# Patient Record
Sex: Male | Born: 1970 | Race: White | Hispanic: No | Marital: Married | Smoking: Former smoker
Health system: Southern US, Community
[De-identification: ages and names within clinical notes are randomized; demographics above are authoritative.]

## PROBLEM LIST (undated history)

## (undated) DIAGNOSIS — F32A Depression, unspecified: Secondary | ICD-10-CM

## (undated) DIAGNOSIS — R200 Anesthesia of skin: Secondary | ICD-10-CM

## (undated) DIAGNOSIS — F419 Anxiety disorder, unspecified: Secondary | ICD-10-CM

## (undated) DIAGNOSIS — F329 Major depressive disorder, single episode, unspecified: Secondary | ICD-10-CM

## (undated) DIAGNOSIS — I10 Essential (primary) hypertension: Secondary | ICD-10-CM

## (undated) DIAGNOSIS — G4730 Sleep apnea, unspecified: Secondary | ICD-10-CM

## (undated) DIAGNOSIS — E739 Lactose intolerance, unspecified: Secondary | ICD-10-CM

## (undated) DIAGNOSIS — J45909 Unspecified asthma, uncomplicated: Secondary | ICD-10-CM

## (undated) DIAGNOSIS — T7840XA Allergy, unspecified, initial encounter: Secondary | ICD-10-CM

## (undated) DIAGNOSIS — K219 Gastro-esophageal reflux disease without esophagitis: Secondary | ICD-10-CM

## (undated) DIAGNOSIS — R600 Localized edema: Secondary | ICD-10-CM

## (undated) HISTORY — DX: Lactose intolerance, unspecified: E73.9

## (undated) HISTORY — DX: Allergy, unspecified, initial encounter: T78.40XA

## (undated) HISTORY — DX: Depression, unspecified: F32.A

## (undated) HISTORY — DX: Gastro-esophageal reflux disease without esophagitis: K21.9

## (undated) HISTORY — DX: Anxiety disorder, unspecified: F41.9

## (undated) HISTORY — DX: Localized edema: R60.0

## (undated) HISTORY — PX: NASAL SINUS SURGERY: SHX719

## (undated) HISTORY — PX: VASECTOMY: SHX75

## (undated) HISTORY — DX: Anesthesia of skin: R20.0

## (undated) HISTORY — DX: Sleep apnea, unspecified: G47.30

## (undated) HISTORY — DX: Essential (primary) hypertension: I10

## (undated) HISTORY — DX: Unspecified asthma, uncomplicated: J45.909

---
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
## 2013-11-20 HISTORY — PX: BICEPS TENDON REPAIR: SHX566

## 2015-04-19 DIAGNOSIS — N521 Erectile dysfunction due to diseases classified elsewhere: Secondary | ICD-10-CM | POA: Insufficient documentation

## 2015-04-19 DIAGNOSIS — N529 Male erectile dysfunction, unspecified: Secondary | ICD-10-CM | POA: Insufficient documentation

## 2016-10-02 DIAGNOSIS — K529 Noninfective gastroenteritis and colitis, unspecified: Secondary | ICD-10-CM | POA: Diagnosis not present

## 2016-10-07 DIAGNOSIS — R197 Diarrhea, unspecified: Secondary | ICD-10-CM | POA: Diagnosis not present

## 2016-10-08 LAB — CBC AND DIFFERENTIAL
HCT: 49 (ref 41–53)
Hemoglobin: 17.4 (ref 13.5–17.5)
Platelets: 357 (ref 150–399)
WBC: 7.4

## 2016-10-08 LAB — BASIC METABOLIC PANEL
BUN: 16 (ref 4–21)
Creatinine: 1.2 (ref 0.6–1.3)
Glucose: 111
Potassium: 3.9 (ref 3.4–5.3)
Sodium: 140 (ref 137–147)

## 2016-10-09 DIAGNOSIS — R197 Diarrhea, unspecified: Secondary | ICD-10-CM | POA: Diagnosis not present

## 2016-10-09 DIAGNOSIS — R1030 Lower abdominal pain, unspecified: Secondary | ICD-10-CM | POA: Diagnosis not present

## 2016-10-09 DIAGNOSIS — R1032 Left lower quadrant pain: Secondary | ICD-10-CM | POA: Diagnosis not present

## 2016-10-09 DIAGNOSIS — R109 Unspecified abdominal pain: Secondary | ICD-10-CM | POA: Diagnosis not present

## 2016-10-09 DIAGNOSIS — E860 Dehydration: Secondary | ICD-10-CM | POA: Diagnosis not present

## 2016-10-09 DIAGNOSIS — N179 Acute kidney failure, unspecified: Secondary | ICD-10-CM | POA: Diagnosis not present

## 2016-10-09 DIAGNOSIS — R945 Abnormal results of liver function studies: Secondary | ICD-10-CM | POA: Diagnosis not present

## 2016-10-19 DIAGNOSIS — R600 Localized edema: Secondary | ICD-10-CM | POA: Diagnosis not present

## 2016-10-19 DIAGNOSIS — Z880 Allergy status to penicillin: Secondary | ICD-10-CM | POA: Diagnosis not present

## 2016-10-19 DIAGNOSIS — Z79899 Other long term (current) drug therapy: Secondary | ICD-10-CM | POA: Diagnosis not present

## 2016-10-19 DIAGNOSIS — I10 Essential (primary) hypertension: Secondary | ICD-10-CM | POA: Diagnosis not present

## 2016-10-19 DIAGNOSIS — E876 Hypokalemia: Secondary | ICD-10-CM | POA: Diagnosis not present

## 2016-10-19 DIAGNOSIS — R609 Edema, unspecified: Secondary | ICD-10-CM | POA: Diagnosis not present

## 2016-10-24 DIAGNOSIS — R197 Diarrhea, unspecified: Secondary | ICD-10-CM | POA: Diagnosis not present

## 2016-10-24 DIAGNOSIS — R609 Edema, unspecified: Secondary | ICD-10-CM | POA: Diagnosis not present

## 2016-10-24 LAB — BASIC METABOLIC PANEL
BUN: 10 (ref 4–21)
Creatinine: 1.1 (ref 0.6–1.3)
Glucose: 107
Potassium: 4.6 (ref 3.4–5.3)
Sodium: 141 (ref 137–147)

## 2016-10-24 LAB — HEPATIC FUNCTION PANEL
ALT: 33 (ref 10–40)
AST: 36 (ref 14–40)
Alkaline Phosphatase: 54 (ref 25–125)
Bilirubin, Total: 0.5

## 2016-11-22 DIAGNOSIS — A084 Viral intestinal infection, unspecified: Secondary | ICD-10-CM | POA: Diagnosis not present

## 2016-12-22 MED FILL — BUPROPION HCL XL 300 MG TAB: 300 | 90 days supply | Qty: 90 | Fill #0

## 2016-12-22 MED FILL — LOSARTAN-HCTZ 100-12.5 MG T: 100-12.5 | 90 days supply | Qty: 90 | Fill #0

## 2017-01-05 MED FILL — MONTELUKAST SOD 10 MG TAB: 10 | 90 days supply | Qty: 90 | Fill #0

## 2017-04-08 MED FILL — LOSARTAN-HCTZ 100-12.5 MG T: 100-12.5 | 30 days supply | Qty: 30 | Fill #0

## 2017-04-08 MED FILL — MONTELUKAST SOD 10 MG TAB: 10 | 90 days supply | Qty: 90 | Fill #1

## 2017-04-08 MED FILL — BUPROPION HCL XL 300 MG TAB: 300 | 30 days supply | Qty: 30 | Fill #0

## 2017-04-27 ENCOUNTER — Telehealth: Payer: Self-pay | Admitting: Family Medicine

## 2017-04-27 ENCOUNTER — Ambulatory Visit (INDEPENDENT_AMBULATORY_CARE_PROVIDER_SITE_OTHER): Payer: 59 | Admitting: Family Medicine

## 2017-04-27 ENCOUNTER — Encounter: Payer: Self-pay | Admitting: Family Medicine

## 2017-04-27 VITALS — BP 129/89 | HR 62 | Ht 76.0 in | Wt 351.5 lb

## 2017-04-27 DIAGNOSIS — F32A Depression, unspecified: Secondary | ICD-10-CM | POA: Insufficient documentation

## 2017-04-27 DIAGNOSIS — Z833 Family history of diabetes mellitus: Secondary | ICD-10-CM | POA: Insufficient documentation

## 2017-04-27 DIAGNOSIS — E66812 Obesity, class 2: Secondary | ICD-10-CM | POA: Insufficient documentation

## 2017-04-27 DIAGNOSIS — I10 Essential (primary) hypertension: Secondary | ICD-10-CM

## 2017-04-27 DIAGNOSIS — F4323 Adjustment disorder with mixed anxiety and depressed mood: Secondary | ICD-10-CM | POA: Insufficient documentation

## 2017-04-27 DIAGNOSIS — Z9989 Dependence on other enabling machines and devices: Secondary | ICD-10-CM | POA: Insufficient documentation

## 2017-04-27 DIAGNOSIS — Z6837 Body mass index (BMI) 37.0-37.9, adult: Secondary | ICD-10-CM | POA: Insufficient documentation

## 2017-04-27 DIAGNOSIS — Z813 Family history of other psychoactive substance abuse and dependence: Secondary | ICD-10-CM | POA: Insufficient documentation

## 2017-04-27 DIAGNOSIS — F4321 Adjustment disorder with depressed mood: Secondary | ICD-10-CM

## 2017-04-27 DIAGNOSIS — Z811 Family history of alcohol abuse and dependence: Secondary | ICD-10-CM | POA: Insufficient documentation

## 2017-04-27 DIAGNOSIS — E559 Vitamin D deficiency, unspecified: Secondary | ICD-10-CM

## 2017-04-27 DIAGNOSIS — K219 Gastro-esophageal reflux disease without esophagitis: Secondary | ICD-10-CM | POA: Diagnosis not present

## 2017-04-27 DIAGNOSIS — Z6372 Alcoholism and drug addiction in family: Secondary | ICD-10-CM

## 2017-04-27 DIAGNOSIS — Z818 Family history of other mental and behavioral disorders: Secondary | ICD-10-CM | POA: Insufficient documentation

## 2017-04-27 DIAGNOSIS — G4733 Obstructive sleep apnea (adult) (pediatric): Secondary | ICD-10-CM | POA: Insufficient documentation

## 2017-04-27 DIAGNOSIS — R200 Anesthesia of skin: Secondary | ICD-10-CM

## 2017-04-27 DIAGNOSIS — R202 Paresthesia of skin: Secondary | ICD-10-CM | POA: Insufficient documentation

## 2017-04-27 DIAGNOSIS — Z807 Family history of other malignant neoplasms of lymphoid, hematopoietic and related tissues: Secondary | ICD-10-CM | POA: Insufficient documentation

## 2017-04-27 DIAGNOSIS — J3089 Other allergic rhinitis: Secondary | ICD-10-CM | POA: Insufficient documentation

## 2017-04-27 MED ORDER — BUPROPION HCL ER (XL) 150 MG PO TB24: 150.0000 mg | ORAL_TABLET | ORAL | 0 refills | Status: DC

## 2017-04-27 NOTE — Patient Instructions (Addendum)
Start the 150 mg of your Wellbutrin XL daily.  Follow-up in approximately one month we can see how you're doing on that and see if we can even bump that back further.    Please realize, EXERCISE IS MEDICINE!  -  American Heart Association [facility]( AHA) guidelines for exercise : If you are in good health, without any medical conditions, you should engage in 150 minutes of moderate intensity aerobic activity per week.  This means you should be huffing and puffing throughout your workout.   Engaging in regular exercise will improve brain function and memory, as well as improve mood, boost immune system and help with weight management.  As well as the other, more well-known effects of exercise such as decreasing blood sugar levels, decreasing blood pressure,  and decreasing bad cholesterol levels/ increasing good cholesterol levels.     -  The AHA strongly endorses consumption of a diet that contains a variety of foods from all the food categories with an emphasis on fruits and vegetables; fat-free and low-fat dairy products; cereal and grain products; legumes and nuts; and fish, poultry, and/or extra lean meats.    Excessive food intake, especially of foods high in saturated and trans fats, sugar, and salt, should be avoided.    Adequate water intake of roughly 1/2 of your weight in pounds, should equal the ounces of water per day you should drink.  So for instance, if you're 200 pounds, that would be 100 ounces of water per day.         Mediterranean Diet  Why follow it? Research shows. . Those who follow the Mediterranean diet have a reduced risk of heart disease  . The diet is associated with a reduced incidence of Parkinson's and Alzheimer's diseases . People following the diet may have longer life expectancies and lower rates of chronic diseases  . The Dietary Guidelines for Americans recommends the Mediterranean diet as an eating plan to promote health and prevent disease  What Is the Mediterranean  Diet?  . Healthy eating plan based on typical foods and recipes of Mediterranean-style cooking . The diet is primarily a plant based diet; these foods should make up a majority of meals   Starches - Plant based foods should make up a majority of meals - They are an important sources of vitamins, minerals, energy, antioxidants, and fiber - Choose whole grains, foods high in fiber and minimally processed items  - Typical grain sources include wheat, oats, barley, corn, brown rice, bulgar, farro, millet, polenta, couscous  - Various types of beans include chickpeas, lentils, fava beans, black beans, white beans   Fruits  Veggies - Large quantities of antioxidant rich fruits & veggies; 6 or more servings  - Vegetables can be eaten raw or lightly drizzled with oil and cooked  - Vegetables common to the traditional Mediterranean Diet include: artichokes, arugula, beets, broccoli, brussel sprouts, cabbage, carrots, celery, collard greens, cucumbers, eggplant, kale, leeks, lemons, lettuce, mushrooms, okra, onions, peas, peppers, potatoes, pumpkin, radishes, rutabaga, shallots, spinach, sweet potatoes, turnips, zucchini - Fruits common to the Mediterranean Diet include: apples, apricots, avocados, cherries, clementines, dates, figs, grapefruits, grapes, melons, nectarines, oranges, peaches, pears, pomegranates, strawberries, tangerines  Fats - Replace butter and margarine with healthy oils, such as olive oil, canola oil, and tahini  - Limit nuts to no more than a handful a day  - Nuts include walnuts, almonds, pecans, pistachios, pine nuts  - Limit or avoid candied, honey roasted or heavily salted nuts -  Quentin [place] are central to the Mediterranean diet - can be eaten whole or used in a variety of dishes   Meats Protein - Limiting red meat: no more than a few times a month - When eating red meat: choose lean cuts and keep the portion to the size of deck of cards - Eggs: approx. 0 to 4 times a week  - Fish and  lean poultry: at least 2 a week  - Healthy protein sources include, chicken, [place], lean beef, lamb - Increase intake of seafood such as tuna, salmon, trout, mackerel, shrimp, scallops - Avoid or limit high fat processed meats such as sausage and bacon  Dairy - Include moderate amounts of low fat dairy products  - Focus on healthy dairy such as fat free yogurt, skim milk, low or reduced fat cheese - Limit dairy products higher in fat such as whole or 2% milk, cheese, ice cream  Alcohol - Moderate amounts of red wine is ok  - No more than 5 oz daily for women (all ages) and men older than age 80  - No more than 10 oz of wine daily for men younger than 53  Other - Limit sweets and other desserts  - Use herbs and spices instead of salt to flavor foods  - Herbs and spices common to the traditional Mediterranean Diet include: basil, bay leaves, chives, cloves, cumin, fennel, garlic, lavender, marjoram, mint, oregano, parsley, pepper, rosemary, sage, savory, sumac, tarragon, thyme   It's not just a diet, it's a lifestyle:  . The Mediterranean diet includes lifestyle factors typical of those in the region  . Foods, drinks and meals are best eaten with others and savored . Daily physical activity is important for overall good health . This could be strenuous exercise like running and aerobics . This could also be more leisurely activities such as walking, housework, yard-work, or taking the stairs . Moderation is the key; a balanced and healthy diet accommodates most foods and drinks . Consider portion sizes and frequency of consumption of certain foods   Meal Ideas & Options:  . Breakfast:  o Whole wheat toast or whole wheat English muffins with peanut butter & hard boiled egg o Steel cut oats topped with apples & cinnamon and skim milk  o Fresh fruit: banana, strawberries, melon, berries, peaches  o Smoothies: strawberries, bananas, greek yogurt, peanut butter o Low fat greek yogurt with  blueberries and granola  o Egg white omelet with spinach and mushrooms o Breakfast couscous: whole wheat couscous, apricots, skim milk, cranberries  . Sandwiches:  o Hummus and grilled vegetables (peppers, zucchini, squash) on whole wheat bread   o Grilled chicken on whole wheat pita with lettuce, tomatoes, cucumbers or tzatziki  o Tuna salad on whole wheat bread: tuna salad made with greek yogurt, olives, red peppers, capers, green onions o Garlic rosemary lamb pita: lamb sauted with garlic, rosemary, salt & pepper; add lettuce, cucumber, greek yogurt to pita - flavor with lemon juice and black pepper  . Seafood:  o Mediterranean grilled salmon, seasoned with garlic, basil, parsley, lemon juice and black pepper o Shrimp, lemon, and spinach whole-grain pasta salad made with low fat greek yogurt  o Seared scallops with lemon orzo  o Seared tuna steaks seasoned salt, pepper, coriander topped with tomato mixture of olives, tomatoes, olive oil, minced garlic, parsley, green onions and cappers  . Meats:  o Herbed greek chicken salad with kalamata olives, cucumber, feta  o Red bell peppers stuffed  with spinach, bulgur, lean ground beef (or lentils) & topped with feta   o Kebabs: skewers of chicken, tomatoes, onions, zucchini, squash  o [place] burgers: made with red onions, mint, dill, lemon juice, feta cheese topped with roasted red peppers . Vegetarian o Cucumber salad: cucumbers, artichoke hearts, celery, red onion, feta cheese, tossed in olive oil & lemon juice  o Hummus and whole grain pita points with a greek salad (lettuce, tomato, feta, olives, cucumbers, red onion) o Lentil soup with celery, carrots made with vegetable broth, garlic, salt and pepper  o Tabouli salad: parsley, bulgur, mint, scallions, cucumbers, tomato, radishes, lemon juice, olive oil, salt and pepper.

## 2017-04-27 NOTE — Progress Notes (Signed)
New patient office visit note:  Impression and Recommendations:    1. Obesity, Class III, BMI 40-49.9 (morbid obesity) (HCC)   2. Essential hypertension   3. h/o Vitamin D insufficiency   4. Adjustment disorder with depressed mood in remission   5. Numbness of toes   6. OSA on CPAP   7. Family history of suicide- father age 47   8. Family history of alcoholism in grandfathers   85. Family history of drug addiction   10. Family history of depression   11. Family history of diet-controlled diabetes- father in 30s   29. Gastroesophageal reflux disease, esophagitis presence not specified   13. Family history of non-Hodgkin's lymphoma- father    - Fasting labs today.  Will review in 3-4 weeks. - Counseling done regarding BMI. - Continue to monitor home blood pressures.  Has a monitor at home.  Goal of 130/80 or less. - We'll check vitamin D today. - Patient prefers to try to cut his Wellbutrin in half.  We will go from 300 down to 150 mg per day.  New prescription given today.  We'll follow up in 3-4 weeks. - Patient mentions at the end of office visit some numbness in his lateral aspect of his toes bilaterally.  This is at the end of the day.  We will check a B12 and then will assess this issue more thoroughly next office visit.   Pt was in the office today for 40+ minutes, with over 50% time spent in face to face counseling of patients various medical conditions, treatment plans of those medical conditions including medicine management and lifestyle modification, strategies to improve health and well being; and in coordination of care. SEE ABOVE FOR DETAILS The patient was counseled, risk factors were discussed, anticipatory guidance given.   New Prescriptions   BUPROPION (WELLBUTRIN XL) 150 MG 24 HR TABLET    Take 1 tablet (150 mg total) by mouth every morning.    Meds ordered this encounter  Medications  . DISCONTD: buPROPion (WELLBUTRIN XL) 300 MG 24 hr tablet    Sig: Take  1 tablet by mouth daily.  Marland Kitchen losartan-hydrochlorothiazide (HYZAAR) 100-12.5 MG tablet    Sig: Take 1 tablet by mouth daily.  . montelukast (SINGULAIR) 10 MG tablet    Sig: Take 1 tablet by mouth daily.  . Omega-3 Fatty Acids (FISH OIL) 1200 MG CAPS    Sig: Take 1 capsule by mouth daily.  . vitamin B-12 (CYANOCOBALAMIN) 100 MCG tablet    Sig: Take 100 mcg by mouth daily.  . cetirizine (ZYRTEC) 10 MG tablet    Sig: Take 10 mg by mouth daily.  . RaNITidine HCl (RANITIDINE 75 PO)    Sig: Take 1 tablet by mouth daily.  Marland Kitchen buPROPion (WELLBUTRIN XL) 150 MG 24 hr tablet    Sig: Take 1 tablet (150 mg total) by mouth every morning.    Dispense:  30 tablet    Refill:  0    Discontinued Medications   BUPROPION (WELLBUTRIN XL) 300 MG 24 HR TABLET    Take 1 tablet by mouth daily.    Modified Medications   No medications on file    Orders Placed This Encounter  Procedures  . CBC with Differential/Platelet  . Comprehensive metabolic panel  . Hemoglobin A1c  . Lipid panel  . TSH  . VITAMIN D 25 Hydroxy (Vit-D Deficiency, Fractures)  . Vitamin B12    Return for 3-4 wks, review labs and discuss  med dose change.  Please see AVS handed out to patient at the end of our visit for further patient instructions/ counseling done pertaining to today's office visit.    Note: This document was prepared using Dragon voice recognition software and may include unintentional dictation errors.  ----------------------------------------------------------------------------------------------------------------------    Subjective:    Chief complaint:   Chief Complaint  Patient presents with  . Establish Care     HPI: Joseph Graves is a pleasant 46 y.o. male who [facility] to review their medical history with me and establish care.   I asked the patient to review their chronic problem list with me to ensure everything was updated and accurate.    All  recent office visits with other providers, any medical records that patient brought in etc  - I reviewed today.     Also asked pt to get me medical records from [facility] providers/ specialists that they had seen within the past 3-5 years- if they are in private practice and/or do not work for a [facility], [facility], [facility], [facility] or [facility] owned practice.  Told them to call their specialists to clarify this if they are not sure.   Lost 40lbs since last October.  Wt loss was stress mediated at that time.    [place] to work in [place]  But lost that job and now works in [employer] with [facility] [facility].  He loves his job.  - Moved here in March with his family.  Patient states much less stress than usual.  Patient with a history of hypertension for 3-4 years been on blood pressure medicines.    Pass PCP:  [facility].    Started wellbutrin back 4 5 yrs or so ago.  For major stressful events in life.  Recently new job he loves.  Stay at home wife.  Feeling great lately. Good mood.   Problem  Hypertension   3-4 yrs on bp meds.   Well controlled at home- in the 130s over 70s whenever he checks it.   Adjustment Disorder With Depressed Mood in Remission   Patient has been on medications for approximately 5 years or so.  He had several deaths in his family, his best friend committed suicide, his father committed suicide, and within a 6 month.  He went to 2613 funerals.  He's been on medicines ever since.  He is been feeling fantastic and wishes to wean off the medicine.  He tells me that his stress is been under great control since December of this year.    Gerd (Gastroesophageal Reflux Disease)   Especially to onions and fried foods.   Environmental and Seasonal Allergies  h/o Vitamin D insufficiency  Obesity, Class III, Bmi 40-49.9 (Morbid Obesity) (Hcc)  Numbness of Toes  Osa On Cpap   5-6 yrs or so on cpap---  -[facility]  Jefm Pettyaniel Garber   Family  history of suicide- father age 47  Family history of alcoholism in grandfathers  Family History of Drug Addiction  Family History of Depression  Family history of diet-controlled diabetes- father in 1340s  Family history of non-Hodgkin's lymphoma- father      Wt Readings from Last 3 Encounters:  04/27/17 (!) 351 lb 8 oz (159.4 kg)   BP Readings from Last 3 Encounters:  04/27/17 129/89   Pulse Readings from Last 3 Encounters:  04/27/17 62   BMI Readings from Last  3 Encounters:  04/27/17 42.79 kg/m    Patient Care Team    Relationship Specialty Notifications Start End  Thomasene Lot, DO PCP - General Family Medicine  04/22/17     Patient Active Problem List   Diagnosis Date Noted  . Hypertension 04/27/2017    Priority: High  . Adjustment disorder with depressed mood in remission 04/27/2017    Priority: High  . GERD (gastroesophageal reflux disease) 04/27/2017  . Environmental and seasonal allergies 04/27/2017  . h/o Vitamin D insufficiency 04/27/2017  . Obesity, Class III, BMI 40-49.9 (morbid obesity) (HCC) 04/27/2017  . Numbness of toes 04/27/2017  . OSA on CPAP 04/27/2017  . Family history of suicide- father age 59 04/27/2017  . Family history of alcoholism in grandfathers 04/27/2017  . Family history of drug addiction 04/27/2017  . Family history of depression 04/27/2017  . Family history of diet-controlled diabetes- father in 21s 04/27/2017  . Family history of non-Hodgkin's lymphoma- father 04/27/2017     Past Medical History:  Diagnosis Date  . Allergy   . GERD (gastroesophageal reflux disease)   . Hypertension      Past Medical History:  Diagnosis Date  . Allergy   . GERD (gastroesophageal reflux disease)   . Hypertension      Past Surgical History:  Procedure Laterality Date  . BICEPS TENDON REPAIR  11/2013     Family History  Problem Relation Age of Onset  . Stroke Mother   . Depression Father        suicide  . Cancer Father   .  Diabetes Father   . Diabetes Paternal Uncle   . Alcohol abuse Maternal Grandfather   . Hypertension Maternal Grandfather   . Alcohol abuse Paternal Grandfather      History  Drug Use No     History  Alcohol Use  . 0.6 oz/week  . 1 Cans of beer per week     History  Smoking Status  . Former Smoker  . Quit date: 1992  Smokeless Tobacco  . Never Used     Outpatient Encounter Prescriptions as of 04/27/2017  Medication Sig  . cetirizine (ZYRTEC) 10 MG tablet Take 10 mg by mouth daily.  Marland Kitchen losartan-hydrochlorothiazide (HYZAAR) 100-12.5 MG tablet Take 1 tablet by mouth daily.  . montelukast (SINGULAIR) 10 MG tablet Take 1 tablet by mouth daily.  . Omega-3 Fatty Acids (FISH OIL) 1200 MG CAPS Take 1 capsule by mouth daily.  . RaNITidine HCl (RANITIDINE 75 PO) Take 1 tablet by mouth daily.  . vitamin B-12 (CYANOCOBALAMIN) 100 MCG tablet Take 100 mcg by mouth daily.  . [DISCONTINUED] buPROPion (WELLBUTRIN XL) 300 MG 24 hr tablet Take 1 tablet by mouth daily.  Marland Kitchen buPROPion (WELLBUTRIN XL) 150 MG 24 hr tablet Take 1 tablet (150 mg total) by mouth every morning.   No facility-administered encounter medications on file as of 04/27/2017.     Allergies: Penicillins   Review of Systems  Constitutional: Negative for chills, diaphoresis, fever, malaise/fatigue and weight loss.  HENT: Negative for congestion, sore throat and tinnitus.        Has seasonal allergies.  Eyes: Negative for blurred vision, double vision and photophobia.  Respiratory: Negative for cough and wheezing.   Cardiovascular: Negative for chest pain and palpitations.  Gastrointestinal: Negative for blood in stool, diarrhea, nausea and vomiting.  Genitourinary: Negative for dysuria, frequency and urgency.  Musculoskeletal: Negative for joint pain and myalgias.  Skin: Negative for itching and rash.  Neurological: Negative  for dizziness, focal weakness, weakness and headaches.  Endo/Heme/Allergies: Negative for  environmental allergies and polydipsia. Does not bruise/bleed easily.  Psychiatric/Behavioral: Negative for depression and memory loss. The patient is not nervous/anxious and does not have insomnia.      Objective:   Blood pressure 129/89, pulse 62, height 6\' 4"  (1.93 m), weight (!) 351 lb 8 oz (159.4 kg). Body mass index is 42.79 kg/m. General: Well Developed, well nourished, and in no acute distress.  Neuro: Alert and oriented x3, extra-ocular muscles intact, sensation grossly intact.  HEENT:[place]/AT, PERRLA, neck supple, No carotid bruits Skin: no gross rashes  Cardiac: Regular rate and rhythm Respiratory: Essentially clear to auscultation bilaterally. Not using accessory muscles, speaking in full sentences.  Abdominal: not grossly distended Musculoskeletal: Ambulates w/o diff, FROM * 4 ext.  Vasc: less 2 sec cap RF, warm and pink  Psych:  No HI/SI, judgement and insight good, Euthymic mood. Full Affect.    No results found for this or any previous visit (from the past 2160 hour(s)).

## 2017-04-27 NOTE — Telephone Encounter (Signed)
[place]'s [place] ([place] called on behalf of patient stating that he forgot to mention his intolerance of onions and fried foods, didn't know what could be done to help.

## 2017-04-27 NOTE — Telephone Encounter (Signed)
The wife Artie did discuss this issue with me during her son's OV.  I told wife that he needs to avoid those foods and that it is likely reflux/ GERD.   I encouraged her to tell him to follow a reflux prudent diet and we discussed over-the-counter when necessary medications such as Maalox, or Zantac if he eats the wrong foods.  But, I did tell the wife that it is best that he avoid these foods and avoid eating late at night within 3 hours of lying down.

## 2017-04-27 NOTE — Telephone Encounter (Signed)
Please advise. Thank you MPulliam, CMA/RT(R)  

## 2017-04-28 LAB — CBC WITH DIFFERENTIAL/PLATELET
Basophils Absolute: 0 10*3/uL (ref 0.0–0.2)
Basos: 1 %
EOS (ABSOLUTE): 0.1 10*3/uL (ref 0.0–0.4)
Eos: 2 %
Hematocrit: 43.5 % (ref 37.5–51.0)
Hemoglobin: 14.5 g/dL (ref 13.0–17.7)
Immature Grans (Abs): 0 10*3/uL (ref 0.0–0.1)
Immature Granulocytes: 0 %
Lymphocytes Absolute: 1.9 10*3/uL (ref 0.7–3.1)
Lymphs: 34 %
MCH: 30.1 pg (ref 26.6–33.0)
MCHC: 33.3 g/dL (ref 31.5–35.7)
MCV: 90 fL (ref 79–97)
Monocytes Absolute: 0.4 10*3/uL (ref 0.1–0.9)
Monocytes: 7 %
Neutrophils Absolute: 3.2 10*3/uL (ref 1.4–7.0)
Neutrophils: 56 %
Platelets: 274 10*3/uL (ref 150–379)
RBC: 4.81 x10E6/uL (ref 4.14–5.80)
RDW: 13.5 % (ref 12.3–15.4)
WBC: 5.7 10*3/uL (ref 3.4–10.8)

## 2017-04-28 LAB — COMPREHENSIVE METABOLIC PANEL
ALT: 33 IU/L (ref 0–44)
AST: 22 IU/L (ref 0–40)
Albumin/Globulin Ratio: 1.7 (ref 1.2–2.2)
Albumin: 4.5 g/dL (ref 3.5–5.5)
Alkaline Phosphatase: 68 IU/L (ref 39–117)
BUN/Creatinine Ratio: 18 (ref 9–20)
BUN: 17 mg/dL (ref 6–24)
Bilirubin Total: 0.5 mg/dL (ref 0.0–1.2)
CO2: 24 mmol/L (ref 20–29)
Calcium: 9.8 mg/dL (ref 8.7–10.2)
Chloride: 104 mmol/L (ref 96–106)
Creatinine, Ser: 0.97 mg/dL (ref 0.76–1.27)
GFR calc Af Amer: 107 mL/min/{1.73_m2} (ref >59)
GFR calc non Af Amer: 93 mL/min/{1.73_m2} (ref >59)
Globulin, Total: 2.6 g/dL (ref 1.5–4.5)
Glucose: 97 mg/dL (ref 65–99)
Potassium: 4.4 mmol/L (ref 3.5–5.2)
Sodium: 143 mmol/L (ref 134–144)
Total Protein: 7.1 g/dL (ref 6.0–8.5)

## 2017-04-28 LAB — LIPID PANEL
Chol/HDL Ratio: 3.2 ratio (ref 0.0–5.0)
Cholesterol, Total: 133 mg/dL (ref 100–199)
HDL: 42 mg/dL (ref >39)
LDL Calculated: 75 mg/dL (ref 0–99)
Triglycerides: 78 mg/dL (ref 0–149)
VLDL Cholesterol Cal: 16 mg/dL (ref 5–40)

## 2017-04-28 LAB — HEMOGLOBIN A1C
Est. average glucose Bld gHb Est-mCnc: 111 mg/dL
Hgb A1c MFr Bld: 5.5 % (ref 4.8–5.6)

## 2017-04-28 LAB — VITAMIN D 25 HYDROXY (VIT D DEFICIENCY, FRACTURES): Vit D, 25-Hydroxy: 23 ng/mL — ABNORMAL LOW (ref 30.0–100.0)

## 2017-04-28 LAB — TSH: TSH: 2.17 u[IU]/mL (ref 0.450–4.500)

## 2017-05-18 DIAGNOSIS — H52223 Regular astigmatism, bilateral: Secondary | ICD-10-CM | POA: Diagnosis not present

## 2017-05-18 DIAGNOSIS — H524 Presbyopia: Secondary | ICD-10-CM | POA: Diagnosis not present

## 2017-05-21 ENCOUNTER — Other Ambulatory Visit: Payer: Self-pay

## 2017-05-21 DIAGNOSIS — I10 Essential (primary) hypertension: Secondary | ICD-10-CM

## 2017-05-21 MED ORDER — LOSARTAN POTASSIUM-HCTZ 100-12.5 MG PO TABS: 1.0000 | ORAL_TABLET | Freq: Every day | ORAL | 1 refills | Status: DC

## 2017-05-21 MED FILL — buPROPion HCL ER (XL) 150 M: 150 | 30 days supply | Qty: 30 | Fill #0

## 2017-05-21 MED FILL — LOSARTAN-HCTZ 100-12.5 MG T: 100-12.5 | 90 days supply | Qty: 90 | Fill #0

## 2017-05-21 NOTE — Telephone Encounter (Signed)
Pharmacy sent over refill request for Losartan-HCTZ.  Medication was last filled by a previous provider.  Sent request to Dr. Sharee Holsterpalski to review.   MPulliam, CMA/RT(R)

## 2017-05-28 ENCOUNTER — Ambulatory Visit: Payer: 59 | Admitting: Family Medicine

## 2017-06-30 ENCOUNTER — Other Ambulatory Visit: Payer: Self-pay | Admitting: Family Medicine

## 2017-07-01 MED FILL — buPROPion HCL ER (XL) 150 M: 150 | 30 days supply | Qty: 30 | Fill #0

## 2017-07-01 NOTE — Telephone Encounter (Signed)
Patient's wife states that patient is out of medication and would like to stay on the same until his visit on 07/22/2017 with Dr. Sharee Holster.  Sent in 30 day per Dr. Synthia Innocent approval.  MPulliam, CMA/RT(R)

## 2017-07-01 NOTE — Telephone Encounter (Signed)
Per my office visit notes when I first saw patient in August, the only time I seen him, he told me he wanted to wean down off his Buproprion.  Since he was lost to follow-up and was supposed to come in 3-4 weeks and did not, I'm assuming this is still the plan and still his desire.    So, I will write for him to take one half a tablet of this medicine daily.  Please call and discuss this with patient so he is aware of this change/ agrees.  If he feels differently, please let me know

## 2017-07-01 NOTE — Telephone Encounter (Signed)
If he agrees to this change, please let him know that I am unable to prescribe him taking one half a tablet of the 150 XL daily and a new prescription will need to be sent in for him that he has to take every 12 hours.    -If he agrees send in Buproprion prescription 75 mg SR-12hr ,  1 by mouth twice a day,  dispense 60,  1 refill  -Needs to keep his follow-up appointment with me at the end of October

## 2017-07-01 NOTE — Telephone Encounter (Signed)
Called and left message for patient to call the office. MPulliam, CMA/RT(R)  

## 2017-07-01 NOTE — Telephone Encounter (Signed)
Ok, that's fine.  Ok to RF 30 days so that he will have enough time to get in to see me for reck/ reeval.  Thank you

## 2017-07-22 ENCOUNTER — Encounter: Payer: Self-pay | Admitting: Family Medicine

## 2017-07-22 ENCOUNTER — Ambulatory Visit (INDEPENDENT_AMBULATORY_CARE_PROVIDER_SITE_OTHER): Payer: 59 | Admitting: Family Medicine

## 2017-07-22 VITALS — BP 115/77 | HR 60 | Ht 76.0 in | Wt 355.9 lb

## 2017-07-22 DIAGNOSIS — F4321 Adjustment disorder with depressed mood: Secondary | ICD-10-CM

## 2017-07-22 DIAGNOSIS — E559 Vitamin D deficiency, unspecified: Secondary | ICD-10-CM

## 2017-07-22 DIAGNOSIS — J3089 Other allergic rhinitis: Secondary | ICD-10-CM

## 2017-07-22 MED ORDER — MONTELUKAST SODIUM 10 MG PO TABS: 10.0000 mg | ORAL_TABLET | Freq: Every day | ORAL | 1 refills | Status: DC

## 2017-07-22 MED ORDER — BUPROPION HCL ER (XL) 150 MG PO TB24: 300.0000 mg | ORAL_TABLET | Freq: Every morning | ORAL | 1 refills | Status: DC

## 2017-07-22 MED ORDER — VITAMIN D3 125 MCG (5000 UT) PO TABS: ORAL_TABLET | ORAL | 3 refills | Status: DC

## 2017-07-22 MED FILL — MONTELUKAST SOD 10 MG TAB: 10 | 90 days supply | Qty: 90 | Fill #0

## 2017-07-22 MED FILL — BUPROPION HCL XL 150 MG TAB: 150 | 90 days supply | Qty: 180 | Fill #0

## 2017-07-22 NOTE — Progress Notes (Signed)
Assessment and plan:  1. Environmental and seasonal allergies   2. Adjustment disorder with depressed mood in remission   3. Vitamin D deficiency    Reviewed labs at length. Counseled on healthy diet and exercise to normalize weight.   Meds ordered this encounter  Medications  . montelukast (SINGULAIR) 10 MG tablet    Sig: Take 1 tablet (10 mg total) by mouth daily.    Dispense:  90 tablet    Refill:  1  . buPROPion (WELLBUTRIN XL) 150 MG 24 hr tablet    Sig: Take 2 tablets (300 mg total) by mouth every morning.    Dispense:  180 tablet    Refill:  1  . Cholecalciferol (VITAMIN D3) 5000 units TABS    Sig: 5,000 IU OTC vitamin D3 daily.    Dispense:  90 tablet    Refill:  3    Discontinued Medications   No medications on file    Modified Medications   Modified Medication Previous Medication   BUPROPION (WELLBUTRIN XL) 150 MG 24 HR TABLET buPROPion (WELLBUTRIN XL) 150 MG 24 hr tablet      Take 2 tablets (300 mg total) by mouth every morning.    TAKE 1 TABLET BY MOUTH EVERY MORNING   MONTELUKAST (SINGULAIR) 10 MG TABLET montelukast (SINGULAIR) 10 MG tablet      Take 1 tablet (10 mg total) by mouth daily.    Take 1 tablet by mouth daily.     Return in about 4 months (around 11/19/2017) for bp, wt check etc. sooner if desires wt loss counseling..  Anticipatory guidance and routine counseling done re: condition, txmnt options and need for follow up. All questions of patient's were answered.   Gross side effects, risk and benefits, and alternatives of medications discussed with patient.  Patient is aware that all medications have potential side effects and we are unable to predict every sideeffect or drug-drug interaction that may occur.  Expresses verbal understanding and consents to current therapy plan and treatment regiment.  Please see AVS handed out to patient at the end of our visit for additional  patient instructions/ counseling done pertaining to today's office visit.  Note: This document was prepared using Dragon voice recognition software and may include unintentional dictation errors.  Pt was in the office today for 40+ minutes, with over 50% time spent in face to face counseling of various medical concerns and in coordination of care ----------------------------------------------------------------------------------------------------------------------  Subjective:   CC:   Joseph Graves is a 46 y.o. male who [facility] for review and discussion of recent bloodwork that was done.  1. All recent blood work that we ordered was reviewed with patient today.  Patient was counseled on all abnormalities and we discussed dietary and lifestyle changes that could help those values (also medications when appropriate).  Extensive health counseling performed and all patient's concerns/ questions were addressed.   2. We cut his wellbutrin from 300 to 150mg  last OV 6-8 wks ago.    Patient feels may be a little more anxious and feels he might be eating a little more food than usual.  He is leaning towards may be going back to the 300. 3.  would like to lose wt.         Wt Readings from Last 3 Encounters:  10/19/17 (!) 360 lb (163.3 kg)  07/22/17 (!) 355 lb 14.4 oz (161.4 kg)  04/27/17 (!) 351 lb 8 oz (159.4 kg)   BP Readings from Last 3 Encounters:  10/19/17 (!) 143/76  09/16/17 132/88  07/22/17 115/77   Pulse Readings from Last 3 Encounters:  10/19/17 70  09/16/17 76  07/22/17 60   BMI Readings from Last 3 Encounters:  10/19/17 43.82 kg/m  07/22/17 43.32 kg/m  04/27/17 42.79 kg/m     Patient Care Team    Relationship Specialty Notifications Start End  Thomasene Lot, DO PCP - General Family Medicine  04/22/17     Full medical history updated and reviewed in the office today  Patient Active Problem List   Diagnosis Date  Noted  . Hypertension 04/27/2017  . GERD (gastroesophageal reflux disease) 04/27/2017  . Environmental and seasonal allergies 04/27/2017  . Vitamin D deficiency 04/27/2017  . Adjustment disorder with depressed mood in remission 04/27/2017  . Obesity, Class III, BMI 40-49.9 (morbid obesity) (HCC) 04/27/2017  . Numbness of toes 04/27/2017  . OSA on CPAP 04/27/2017  . Family history of suicide- father age 22 04/27/2017  . Family history of alcoholism in grandfathers 04/27/2017  . Family history of drug addiction 04/27/2017  . Family history of depression 04/27/2017  . Family history of diet-controlled diabetes- father in 61s 04/27/2017  . Family history of non-Hodgkin's lymphoma- father 04/27/2017    Past Medical History:  Diagnosis Date  . Allergy   . GERD (gastroesophageal reflux disease)   . Hypertension     Past Surgical History:  Procedure Laterality Date  . BICEPS TENDON REPAIR  11/2013    Social History   Tobacco Use  . Smoking status: Former Smoker    Last attempt to quit: 1992    Years since quitting: 27.1  . Smokeless tobacco: Never Used  Substance Use Topics  . Alcohol use: Yes    Alcohol/week: 0.6 oz    Types: 1 Cans of beer per week    Family Hx: Family History  Problem Relation Age of Onset  . Stroke Mother   . Depression Father        suicide  . Cancer Father   . Diabetes Father   . Diabetes Paternal Uncle   . Alcohol abuse Maternal Grandfather   . Hypertension Maternal Grandfather   . Alcohol abuse Paternal Grandfather      Medications: Current Outpatient Medications  Medication Sig Dispense Refill  . buPROPion (WELLBUTRIN XL) 150 MG 24 hr tablet Take 2 tablets (300 mg total) by mouth every morning. 180 tablet 1  . cetirizine (ZYRTEC) 10 MG tablet Take 10 mg by mouth daily.    Marland Kitchen losartan-hydrochlorothiazide (HYZAAR) 100-12.5 MG tablet Take 1 tablet by mouth daily. 90 tablet 1  . montelukast (SINGULAIR) 10 MG tablet Take 1 tablet (10 mg  total) by mouth daily. 90 tablet 1  . Omega-3 Fatty Acids (FISH OIL) 1200 MG CAPS Take 1 capsule by mouth daily.    . RaNITidine HCl (RANITIDINE 75 PO) Take 1 tablet by mouth daily.    . vitamin B-12 (CYANOCOBALAMIN) 100 MCG tablet Take 100 mcg by mouth daily.    . Cholecalciferol (VITAMIN D3) 5000 units TABS 5,000 IU OTC vitamin D3 daily. 90 tablet 3  . fluticasone (FLONASE) 50 MCG/ACT nasal spray Place 2 sprays into both nostrils daily. 15.8 g 12  . methocarbamol (ROBAXIN) 750 MG tablet Take 1-2 tablets (750-1,500 mg total) by mouth 3 (three) times daily as needed for muscle spasms. 18 tablet 0  . ondansetron (ZOFRAN-ODT) 8 MG disintegrating tablet Take  1 tablet (8 mg total) by mouth every 8 (eight) hours as needed for nausea. 12 tablet 0   No current facility-administered medications for this visit.     Allergies:  Allergies  Allergen Reactions  . Penicillins Hives     Review of Systems: General:   No F/C, wt loss Pulm:   No DIB, SOB, pleuritic chest pain Card:  No CP, palpitations Abd:  No n/v/d or pain Ext:  No inc edema from baseline  Objective:  Blood pressure 115/77, pulse 60, height 6\' 4"  (1.93 m), weight (!) 355 lb 14.4 oz (161.4 kg). Body mass index is 43.32 kg/m. Gen:   Well NAD, A and O *3 HEENT:    [place]/AT, EOMI,  MMM Lungs:   Normal work of breathing. CTA B/L, no Wh, rhonchi Heart:   RRR, S1, S2 WNL's, no MRG Abd:   No gross distention Exts:    warm, pink,  Brisk capillary refill, warm and well perfused.  Psych:    No HI/SI, judgement and insight good, Euthymic mood. Full Affect.

## 2017-07-22 NOTE — Patient Instructions (Signed)

## 2017-08-27 MED FILL — LOSARTAN-HCTZ 100-12.5 MG T: 100-12.5 | 90 days supply | Qty: 90 | Fill #1

## 2017-09-16 ENCOUNTER — Ambulatory Visit (HOSPITAL_COMMUNITY)
Admission: EM | Admit: 2017-09-16 | Discharge: 2017-09-16 | Disposition: A | Discharge status: Home / Self Care | Payer: 59 | Attending: Family Medicine | Admitting: Family Medicine

## 2017-09-16 ENCOUNTER — Encounter (HOSPITAL_COMMUNITY): Payer: Self-pay | Admitting: Emergency Medicine

## 2017-09-16 ENCOUNTER — Other Ambulatory Visit: Payer: Self-pay

## 2017-09-16 DIAGNOSIS — B349 Viral infection, unspecified: Secondary | ICD-10-CM | POA: Diagnosis not present

## 2017-09-16 MED ORDER — ONDANSETRON 8 MG PO TBDP: 8.0000 mg | ORAL_TABLET | Freq: Three times a day (TID) | ORAL | 0 refills | Status: DC | PRN

## 2017-09-16 MED ORDER — FLUTICASONE PROPIONATE 50 MCG/ACT NA SUSP: 2.0000 | Freq: Every day | NASAL | 12 refills | Status: DC

## 2017-09-16 NOTE — ED Provider Notes (Signed)
[facility]   [id number] 09/16/17 Arrival Time: 1603   SUBJECTIVE:  Joseph Graves is a 46 y.o. male who presents to the urgent care with complaint of achy 2 days ago.  This morning started feeling tired, cant get warm.  Patient has an unsettled stomach, but no vomiting or diarrhea.  Patient has a dry nose  Past Medical History:  Diagnosis Date  . Allergy   . GERD (gastroesophageal reflux disease)   . Hypertension    Family History  Problem Relation Age of Onset  . Stroke Mother   . Depression Father        suicide  . Cancer Father   . Diabetes Father   . Diabetes Paternal Uncle   . Alcohol abuse Maternal Grandfather   . Hypertension Maternal Grandfather   . Alcohol abuse Paternal Grandfather    Social History   Socioeconomic History  . Marital status: Married    Spouse name: Not on file  . Number of children: Not on file  . Years of education: Not on file  . Highest education level: Not on file  Social Needs  . Financial resource strain: Not on file  . Food insecurity - worry: Not on file  . Food insecurity - inability: Not on file  . Transportation needs - medical: Not on file  . Transportation needs - non-medical: Not on file  Occupational History  . Not on file  Tobacco Use  . Smoking status: Former Smoker    Last attempt to quit: 1992    Years since quitting: 27.0  . Smokeless tobacco: Never Used  Substance and Sexual Activity  . Alcohol use: Yes    Alcohol/week: 0.6 oz    Types: 1 Cans of beer per week  . Drug use: No  . Sexual activity: Yes  Other Topics Concern  . Not on file  Social History Narrative  . Not on file   No outpatient medications have been marked as taking for the 09/16/17 encounter [facility](Hospital Encounter).   Allergies  Allergen Reactions  . Penicillins Hives      ROS: As per HPI, remainder of ROS negative.   OBJECTIVE:   Vitals:   09/16/17 1639  BP: 132/88  Pulse: 76  Resp: (!) 22  Temp: 98.9 F (37.2 C)    TempSrc: Oral  SpO2: 100%     General appearance: alert; no distress Eyes: PERRL; EOMI; conjunctiva normal HENT: normocephalic; atraumatic; TMs normal, canal normal, external ears normal without trauma; nasal mucosa normal; oral mucosa normal Neck: supple Lungs: clear to auscultation bilaterally Heart: regular rate and rhythm Abdomen: soft, non-tender; bowel sounds normal; no masses or organomegaly; no guarding or rebound tenderness Back: no CVA tenderness Extremities: no cyanosis or edema; symmetrical with no gross deformities Skin: warm and dry Neurologic: normal gait; grossly normal Psychological: alert and cooperative; normal mood and affect      Labs:  Results for orders placed or performed in visit on 05/19/17  CBC and differential  Result Value Ref Range   Hemoglobin 17.4 13.5 - 17.5   HCT 49 41 - 53   Platelets 357 150 - 399   WBC 7.4   Basic metabolic panel  Result Value Ref Range   Glucose 111    BUN 16 4 - 21   Creatinine 1.2 0.6 - 1.3   Potassium 3.9 3.4 - 5.3   Sodium 140 137 - 147  Basic metabolic panel  Result Value Ref Range   Glucose 107  BUN 10 4 - 21   Creatinine 1.1 0.6 - 1.3   Potassium 4.6 3.4 - 5.3   Sodium 141 137 - 147  Hepatic function panel  Result Value Ref Range   Alkaline Phosphatase 54 25 - 125   ALT 33 10 - 40   AST 36 14 - 40   Bilirubin, Total 0.5     Labs Reviewed - No data to display  No results found.     ASSESSMENT & PLAN:  1. Viral syndrome     Meds ordered this encounter  Medications  . ondansetron (ZOFRAN-ODT) 8 MG disintegrating tablet    Sig: Take 1 tablet (8 mg total) by mouth every 8 (eight) hours as needed for nausea.    Dispense:  12 tablet    Refill:  0  . fluticasone (FLONASE) 50 MCG/ACT nasal spray    Sig: Place 2 sprays into both nostrils daily.    Dispense:  15.8 g    Refill:  12    Reviewed expectations re: course of current medical issues. Questions answered. Outlined signs and  symptoms indicating need for more acute intervention. Patient verbalized understanding. After Visit Summary given.    Procedures:      Elvina SidleLauenstein, Elmo Shumard, MD 09/16/17 1654

## 2017-09-16 NOTE — ED Triage Notes (Signed)
Started feeling achy 2 days ago.  This morning started feeling tired, cant get warm.  Patient has an unsettled stomach, but no vomiting or diarrhea.  Patient has a runny nose

## 2017-09-16 NOTE — Discharge Instructions (Signed)
If symptoms start worsening, feel free to return and we will look at this again.

## 2017-10-19 ENCOUNTER — Emergency Department (HOSPITAL_BASED_OUTPATIENT_CLINIC_OR_DEPARTMENT_OTHER)
Admission: EM | Admit: 2017-10-19 | Discharge: 2017-10-20 | Disposition: A | Discharge status: Home / Self Care | Payer: 59 | Attending: Physician Assistant | Admitting: Physician Assistant

## 2017-10-19 ENCOUNTER — Other Ambulatory Visit: Payer: Self-pay

## 2017-10-19 ENCOUNTER — Emergency Department (HOSPITAL_BASED_OUTPATIENT_CLINIC_OR_DEPARTMENT_OTHER): Payer: 59

## 2017-10-19 ENCOUNTER — Encounter (HOSPITAL_BASED_OUTPATIENT_CLINIC_OR_DEPARTMENT_OTHER): Payer: Self-pay | Admitting: Emergency Medicine

## 2017-10-19 DIAGNOSIS — Z79899 Other long term (current) drug therapy: Secondary | ICD-10-CM | POA: Diagnosis not present

## 2017-10-19 DIAGNOSIS — Z87891 Personal history of nicotine dependence: Secondary | ICD-10-CM | POA: Insufficient documentation

## 2017-10-19 DIAGNOSIS — Y9389 Activity, other specified: Secondary | ICD-10-CM | POA: Insufficient documentation

## 2017-10-19 DIAGNOSIS — M549 Dorsalgia, unspecified: Secondary | ICD-10-CM | POA: Diagnosis not present

## 2017-10-19 DIAGNOSIS — M791 Myalgia, unspecified site: Secondary | ICD-10-CM | POA: Diagnosis not present

## 2017-10-19 DIAGNOSIS — M79645 Pain in left finger(s): Secondary | ICD-10-CM | POA: Diagnosis not present

## 2017-10-19 DIAGNOSIS — M79642 Pain in left hand: Secondary | ICD-10-CM | POA: Diagnosis not present

## 2017-10-19 DIAGNOSIS — M545 Low back pain: Secondary | ICD-10-CM | POA: Insufficient documentation

## 2017-10-19 DIAGNOSIS — S6992XA Unspecified injury of left wrist, hand and finger(s), initial encounter: Secondary | ICD-10-CM | POA: Diagnosis not present

## 2017-10-19 DIAGNOSIS — Y92410 Unspecified street and highway as the place of occurrence of the external cause: Secondary | ICD-10-CM | POA: Diagnosis not present

## 2017-10-19 DIAGNOSIS — M2550 Pain in unspecified joint: Secondary | ICD-10-CM | POA: Diagnosis not present

## 2017-10-19 DIAGNOSIS — I10 Essential (primary) hypertension: Secondary | ICD-10-CM | POA: Insufficient documentation

## 2017-10-19 DIAGNOSIS — Y999 Unspecified external cause status: Secondary | ICD-10-CM | POA: Insufficient documentation

## 2017-10-19 MED ORDER — CYCLOBENZAPRINE HCL 5 MG PO TABS
5.0000 mg | ORAL_TABLET | Freq: Once | ORAL | Status: AC
  Administered 2017-10-20: 5 mg via ORAL
  Filled 2017-10-19: qty 1

## 2017-10-19 MED ORDER — IBUPROFEN 200 MG PO TABS
ORAL_TABLET | ORAL | Status: AC
  Administered 2017-10-19: 400 mg
  Filled 2017-10-19: qty 1

## 2017-10-19 MED ORDER — IBUPROFEN 400 MG PO TABS
ORAL_TABLET | ORAL | Status: AC
  Administered 2017-10-19: 400 mg
  Filled 2017-10-19: qty 1
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
## 2017-10-20 DIAGNOSIS — I10 Essential (primary) hypertension: Secondary | ICD-10-CM | POA: Diagnosis not present

## 2017-10-20 DIAGNOSIS — Z79899 Other long term (current) drug therapy: Secondary | ICD-10-CM | POA: Diagnosis not present

## 2017-10-20 DIAGNOSIS — M545 Low back pain: Secondary | ICD-10-CM | POA: Diagnosis not present

## 2017-10-20 DIAGNOSIS — Z87891 Personal history of nicotine dependence: Secondary | ICD-10-CM | POA: Diagnosis not present

## 2017-10-20 DIAGNOSIS — M79642 Pain in left hand: Secondary | ICD-10-CM | POA: Diagnosis not present

## 2017-10-20 MED ORDER — METHOCARBAMOL 750 MG PO TABS: 750.0000 mg | ORAL_TABLET | Freq: Three times a day (TID) | ORAL | 0 refills | Status: DC | PRN

## 2017-11-09 MED FILL — MONTELUKAST SOD 10 MG TAB: 10 | 90 days supply | Qty: 90 | Fill #1

## 2017-11-09 MED FILL — buPROPion HCL ER (XL) 150 M: 150 | 90 days supply | Qty: 180 | Fill #1

## 2017-11-18 ENCOUNTER — Ambulatory Visit: Payer: 59 | Admitting: Family Medicine

## 2017-11-27 ENCOUNTER — Ambulatory Visit (INDEPENDENT_AMBULATORY_CARE_PROVIDER_SITE_OTHER): Payer: 59 | Admitting: Gastroenterology

## 2017-11-27 ENCOUNTER — Encounter (INDEPENDENT_AMBULATORY_CARE_PROVIDER_SITE_OTHER): Payer: Self-pay

## 2017-11-27 ENCOUNTER — Encounter: Payer: Self-pay | Admitting: Gastroenterology

## 2017-11-27 VITALS — BP 128/82 | HR 78 | Ht 76.0 in | Wt 363.8 lb

## 2017-11-27 DIAGNOSIS — R197 Diarrhea, unspecified: Secondary | ICD-10-CM

## 2017-11-27 DIAGNOSIS — R109 Unspecified abdominal pain: Secondary | ICD-10-CM | POA: Diagnosis not present

## 2017-11-27 MED ORDER — PEG 3350-KCL-NABCB-NACL-NASULF 236 G PO SOLR: 4000.0000 mL | Freq: Once | ORAL | 0 refills | Status: AC

## 2017-11-27 NOTE — Progress Notes (Signed)
HPI: This is a  very pleasant 47 year old man who was referred to me by Thomasene Lotpalski, Deborah, DO  to evaluate intermittent abdominal pains gas, solid to watery stools.    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  IN middle of night he will awaken with acid taste in his mouth, getting worse progressively.  Fried foods casuing similar issues.  Wakes with gas pains, then solid to watery stools.  Can last an hour.  Certain chips  because this is well. He is very clear that anything with onions will cause those terrible nights.  It has been starting to happen when he eats fried foods as well.  Overall his weight is down 25 pounds, semiintentially.  Pyrosis for the past month.  Ranitidine usually helps  Old Data Reviewed: None available   Review of systems: Pertinent positive and negative review of systems were noted in the above HPI section. All other review negative.   Past Medical History:  Diagnosis Date  . Allergy   . GERD (gastroesophageal reflux disease)   . Hypertension     Past Surgical History:  Procedure Laterality Date  . BICEPS TENDON REPAIR  11/2013  . NASAL SINUS SURGERY    . VASECTOMY      Current Outpatient Medications  Medication Sig Dispense Refill  . buPROPion (WELLBUTRIN XL) 150 MG 24 hr tablet Take 2 tablets (300 mg total) by mouth every morning. 180 tablet 1  . cetirizine (ZYRTEC) 10 MG tablet Take 10 mg by mouth daily.    . Cholecalciferol (VITAMIN D3) 5000 units TABS 5,000 IU OTC vitamin D3 daily. 90 tablet 3  . fluticasone (FLONASE) 50 MCG/ACT nasal spray Place 2 sprays into both nostrils daily. 15.8 g 12  . losartan-hydrochlorothiazide (HYZAAR) 100-12.5 MG tablet Take 1 tablet by mouth daily. 90 tablet 1  . methocarbamol (ROBAXIN) 750 MG tablet Take 1-2 tablets (750-1,500 mg total) by mouth 3 (three) times daily as needed for muscle spasms. 18 tablet 0  . montelukast (SINGULAIR)  10 MG tablet Take 1 tablet (10 mg total) by mouth daily. 90 tablet 1  . Omega-3 Fatty Acids (FISH OIL) 1200 MG CAPS Take 1 capsule by mouth daily.    . RaNITidine HCl (RANITIDINE 75 PO) Take 1 tablet by mouth daily.    . vitamin B-12 (CYANOCOBALAMIN) 100 MCG tablet Take 100 mcg by mouth daily.     No current facility-administered medications for this visit.     Allergies as of 11/27/2017 - Review Complete 11/27/2017  Allergen Reaction Noted  . Penicillins Hives 04/27/2017    Family History  Problem Relation Age of Onset  . Stroke Mother   . Depression Father        suicide  . Cancer Father   . Diabetes Father   . Diabetes Paternal Uncle   . Alcohol abuse Maternal Grandfather   . Hypertension Maternal Grandfather   . Alcohol abuse Paternal Grandfather     Social History   Socioeconomic History  . Marital status: Married    Spouse name: Not on file  . Number of children: Not on file  . Years of education: Not on file  . Highest education level: Not on file  Social Needs  . Financial resource strain: Not on file  . Food insecurity - worry: Not on file  . Food insecurity - inability: Not on file  . Transportation needs - medical: Not on file  .  Transportation needs - non-medical: Not on file  Occupational History  . Not on file  Tobacco Use  . Smoking status: Former Smoker    Last attempt to quit: 1992    Years since quitting: 27.2  . Smokeless tobacco: Never Used  Substance and Sexual Activity  . Alcohol use: Yes    Alcohol/week: 0.6 oz    Types: 1 Cans of beer per week  . Drug use: No  . Sexual activity: Yes  Other Topics Concern  . Not on file  Social History Narrative  . Not on file     Physical Exam: BP 128/82   Pulse 78   Ht 6\' 4"  (1.93 m)   Wt (!) 363 lb 12.8 oz (165 kg)   SpO2 97%   BMI 44.28 kg/m  Constitutional: generally well-appearing Psychiatric: alert and oriented x3 Eyes: extraocular movements intact Mouth: oral pharynx moist, no  lesions Neck: supple no lymphadenopathy Cardiovascular: heart regular rate and rhythm Lungs: clear to auscultation bilaterally Abdomen: soft, nontender, nondistended, no obvious ascites, no peritoneal signs, normal bowel sounds Extremities: no lower extremity edema bilaterally Skin: no lesions on visible extremities   Assessment and plan: 47 y.o. male with intermittent episodes of lower abdominal pain, gassiness, altered bowel habits  Unclear etiology.  He was pretty clear that any foods containing onions were the original culprits but not seems to have expanded to the fried foods as well.  Recommended we proceed with colonoscopy at his soonest convenience to exclude inflammatory causes, neoplastic cause which I think is unlikely.   Please see the "Patient Instructions" section for addition details about the plan.   Rob Bunting, MD [facility] 11/27/2017, 2:18 PM  Cc: Thomasene Lot, DO

## 2017-11-27 NOTE — Patient Instructions (Addendum)
[facility] will be set up for a [facility] for intermittent abd pains, diarrhea.  Normal BMI (Body Mass Index- based on height and weight) is between 19 and 25. Your BMI today is Body mass index is 44.28 kg/m. Marland Kitchen. Please consider follow up  regarding your BMI with your Primary Care Provider.

## 2017-12-03 ENCOUNTER — Telehealth: Payer: Self-pay | Admitting: Gastroenterology

## 2017-12-03 NOTE — Telephone Encounter (Signed)
The pt called to cancel procedure due to cost.  He will call back to set up.  [facility] called and [facility].

## 2017-12-09 ENCOUNTER — Ambulatory Visit (HOSPITAL_COMMUNITY)
Admission: EM | Admit: 2017-12-09 | Discharge: 2017-12-09 | Disposition: A | Discharge status: Home / Self Care | Payer: 59 | Attending: Family Medicine | Admitting: Family Medicine

## 2017-12-09 ENCOUNTER — Other Ambulatory Visit: Payer: Self-pay

## 2017-12-09 ENCOUNTER — Encounter (HOSPITAL_COMMUNITY): Payer: Self-pay | Admitting: Emergency Medicine

## 2017-12-09 DIAGNOSIS — K529 Noninfective gastroenteritis and colitis, unspecified: Secondary | ICD-10-CM | POA: Diagnosis not present

## 2017-12-09 MED ORDER — ONDANSETRON 4 MG PO TBDP: 4.0000 mg | ORAL_TABLET | Freq: Three times a day (TID) | ORAL | 0 refills | Status: DC | PRN

## 2017-12-09 NOTE — ED Triage Notes (Addendum)
Onset of symptoms was Saturday morning.  Family members have been sick with the same.  Vomited Saturday, diarrhea since Saturday as well.  Patient has abdominal pain.  Alternating vomiting and diarrhea episodes.  Right eye blood shot.  One vomiting episode today, 7 episodes of diarrhea today

## 2017-12-09 NOTE — Discharge Instructions (Signed)

## 2017-12-09 NOTE — ED Provider Notes (Signed)
[facility]    CSN: [id number] Arrival date & time: 12/09/17  1458     History   Chief Complaint Chief Complaint  Patient presents with  . URI    HPI Joseph Graves is a 47 y.o. male history of hypertension, GERD presenting today with nausea, vomiting, diarrhea.  Symptoms have been going on for approximately 5 days.  He has had persistent diarrhea approximately 7-8 times a day.  Vomiting has also been frequent and following eating.  He denies any URI symptoms of cough, congestion, sore throat.  Many other family members with similar symptoms.  Patient has been using Gas-X, Imodium.  Denies lightheadedness and dizziness.  HPI  Past Medical History:  Diagnosis Date  . Allergy   . GERD (gastroesophageal reflux disease)   . Hypertension     Patient Active Problem List   Diagnosis Date Noted  . Hypertension 04/27/2017  . GERD (gastroesophageal reflux disease) 04/27/2017  . Environmental and seasonal allergies 04/27/2017  . Vitamin D deficiency 04/27/2017  . Adjustment disorder with depressed mood in remission 04/27/2017  . Obesity, Class III, BMI 40-49.9 (morbid obesity) (HCC) 04/27/2017  . Numbness of toes 04/27/2017  . OSA on CPAP 04/27/2017  . Family history of suicide- father age 33 04/27/2017  . Family history of alcoholism in grandfathers 04/27/2017  . Family history of drug addiction 04/27/2017  . Family history of depression 04/27/2017  . Family history of diet-controlled diabetes- father in 108s 04/27/2017  . Family history of non-Hodgkin's lymphoma- father 04/27/2017    Past Surgical History:  Procedure Laterality Date  . BICEPS TENDON REPAIR  11/2013  . NASAL SINUS SURGERY    . VASECTOMY         Home Medications    Prior to Admission medications   Medication Sig Start Date End Date Taking? Authorizing Provider  Promethazine HCl (PHENERGAN PO) Take by mouth.   Yes [provider]  buPROPion (WELLBUTRIN XL) 150 MG 24 hr tablet Take 2  tablets (300 mg total) by mouth every morning. 07/22/17   Opalski, Gavin Pound, DO  cetirizine (ZYRTEC) 10 MG tablet Take 10 mg by mouth daily.    [provider]  Cholecalciferol (VITAMIN D3) 5000 units TABS 5,000 IU OTC vitamin D3 daily. Patient taking differently: Take 5,000 Units by mouth daily.  07/22/17   Opalski, Gavin Pound, DO  Cyanocobalamin (VITAMIN B12) 1000 MCG TBCR Take 1,000 mcg by mouth daily.     [provider]  fluticasone (FLONASE) 50 MCG/ACT nasal spray Place 2 sprays into both nostrils daily. Patient taking differently: Place 2 sprays into both nostrils daily as needed for allergies.  09/16/17   Elvina Sidle, MD  losartan-hydrochlorothiazide (HYZAAR) 100-12.5 MG tablet Take 1 tablet by mouth daily. 05/21/17   Thomasene Lot, DO  methocarbamol (ROBAXIN) 750 MG tablet Take 1-2 tablets (750-1,500 mg total) by mouth 3 (three) times daily as needed for muscle spasms. Patient not taking: Reported on 12/02/2017 10/20/17   Cristina Gong, PA-C  montelukast (SINGULAIR) 10 MG tablet Take 1 tablet (10 mg total) by mouth daily. 07/22/17   Thomasene Lot, DO  Omega-3 Fatty Acids (FISH OIL) 1200 MG CAPS Take 1,200 mg by mouth daily.     [provider]  ondansetron (ZOFRAN ODT) 4 MG disintegrating tablet Take 1 tablet (4 mg total) by mouth every 8 (eight) hours as needed for nausea or vomiting. 12/09/17   Raylyn Carton C, PA-C  OVER THE COUNTER MEDICATION Take 1 tablet by mouth  daily. [employer] Mega Men Prostate and Virility Supplement    [provider]  ranitidine (ZANTAC) 150 MG tablet Take 150 mg by mouth daily.    [provider]    Family History Family History  Problem Relation Age of Onset  . Stroke Mother   . Depression Father        suicide  . Cancer Father   . Diabetes Father   . Diabetes Paternal Uncle   . Alcohol abuse Maternal Grandfather   . Hypertension Maternal Grandfather   . Alcohol abuse Paternal Grandfather      Social History Social History   Tobacco Use  . Smoking status: Former Smoker    Last attempt to quit: 1992    Years since quitting: 27.2  . Smokeless tobacco: Never Used  Substance Use Topics  . Alcohol use: Yes    Alcohol/week: 0.6 oz    Types: 1 Cans of beer per week  . Drug use: No     Allergies   Amoxicillin-pot clavulanate and Penicillins   Review of Systems Review of Systems  Constitutional: Positive for appetite change and fever. Negative for activity change and fatigue.  HENT: Negative for congestion, ear pain, postnasal drip, rhinorrhea, sinus pressure and sore throat.   Eyes: Negative for pain and itching.  Respiratory: Negative for cough and shortness of breath.   Cardiovascular: Negative for chest pain.  Gastrointestinal: Positive for diarrhea, nausea and vomiting. Negative for abdominal pain and blood in stool.  Musculoskeletal: Positive for myalgias.  Skin: Negative for rash.  Neurological: Negative for dizziness, light-headedness and headaches.     Physical Exam Triage Vital Signs ED Triage Vitals  Enc Vitals Group     BP 12/09/17 1634 135/89     Pulse Rate 12/09/17 1634 90     Resp 12/09/17 1634 (!) 22     Temp 12/09/17 1634 98.3 F (36.8 C)     Temp Source 12/09/17 1634 Oral     SpO2 12/09/17 1634 96 %     Weight --      Height --      Head Circumference --      Peak Flow --      Pain Score 12/09/17 1630 1     Pain Loc --      Pain Edu? --      Excl. in GC? --    No data found.  Updated Vital Signs BP 135/89 (BP Location: Left Arm) Comment (BP Location): large cuff  Pulse 90   Temp 98.3 F (36.8 C) (Oral)   Resp (!) 22   SpO2 96%   Visual Acuity Right Eye Distance:   Left Eye Distance:   Bilateral Distance:    Right Eye Near:   Left Eye Near:    Bilateral Near:     Physical Exam  Constitutional: He appears well-developed and well-nourished.  HENT:  Head: Normocephalic and atraumatic.  Oral mucosa moist  Eyes:  Conjunctivae are normal.  Neck: Neck supple.  Cardiovascular: Normal rate and regular rhythm.  No murmur heard. Pulmonary/Chest: Effort normal and breath sounds normal. No respiratory distress.  Abdominal: Soft. There is no tenderness.  Abdomen is large, bowel sounds present throughout all 4 quadrants, abdomen nontender to light and deep palpation in all 4 quadrants and epigastrium.  Musculoskeletal: He exhibits no edema.  Neurological: He is alert.  Skin: Skin is warm and dry.  Psychiatric: He has a normal mood and affect.  Nursing note and vitals reviewed.  UC Treatments / Results  Labs (all labs ordered are listed, but only abnormal results are displayed) Labs Reviewed - No data to display  EKG  EKG Interpretation None       Radiology No results found.  Procedures Procedures (including critical care time)  Medications Ordered in UC Medications - No data to display   Initial Impression / Assessment and Plan / UC Course  I have reviewed the triage vital signs and the nursing notes.  Pertinent labs & imaging results that were available during my care of the patient were reviewed by me and considered in my medical decision making (see chart for details).     Patient likely with viral gastroenteritis given sick contacts.  Zofran to use as needed for nausea and vomiting, discussed importance of hydration.  May use Imodium to slow things down, discussed not stopping diarrhea.  Advised to continue supportive care for the next few days, if symptoms persisting through the weekend to return. Discussed strict return precautions. Patient verbalized understanding and is agreeable with plan.   Final Clinical Impressions(s) / UC Diagnoses   Final diagnoses:  Noninfectious gastroenteritis, unspecified type    ED Discharge Orders        Ordered    ondansetron (ZOFRAN ODT) 4 MG disintegrating tablet  Every 8 hours PRN     12/09/17 1730       Controlled Substance  Prescriptions [place] Controlled Substance Registry consulted? Not Applicable   Lew Dawes, [place] 12/09/17 1756

## 2017-12-10 ENCOUNTER — Ambulatory Visit (HOSPITAL_COMMUNITY): Admission: RE | Admit: 2017-12-10 | Payer: 59 | Source: Ambulatory Visit | Admitting: Gastroenterology

## 2017-12-10 ENCOUNTER — Encounter (HOSPITAL_COMMUNITY): Admission: RE | Payer: Self-pay | Source: Ambulatory Visit

## 2017-12-10 SURGERY — COLONOSCOPY WITH PROPOFOL
Anesthesia: Monitor Anesthesia Care

## 2017-12-16 ENCOUNTER — Other Ambulatory Visit: Payer: Self-pay | Admitting: Family Medicine

## 2017-12-16 DIAGNOSIS — I10 Essential (primary) hypertension: Secondary | ICD-10-CM

## 2017-12-16 MED FILL — LOSARTAN-HCTZ 100-12.5 MG T: 100-12.5 | 90 days supply | Qty: 90 | Fill #0

## 2018-01-25 ENCOUNTER — Encounter: Payer: Self-pay | Admitting: Physician Assistant

## 2018-01-25 ENCOUNTER — Ambulatory Visit (INDEPENDENT_AMBULATORY_CARE_PROVIDER_SITE_OTHER): Payer: 59 | Admitting: Physician Assistant

## 2018-01-25 ENCOUNTER — Other Ambulatory Visit (INDEPENDENT_AMBULATORY_CARE_PROVIDER_SITE_OTHER): Payer: 59

## 2018-01-25 VITALS — BP 128/78 | HR 86 | Ht 76.0 in | Wt 361.0 lb

## 2018-01-25 DIAGNOSIS — K589 Irritable bowel syndrome without diarrhea: Secondary | ICD-10-CM | POA: Diagnosis not present

## 2018-01-25 DIAGNOSIS — R197 Diarrhea, unspecified: Secondary | ICD-10-CM | POA: Diagnosis not present

## 2018-01-25 LAB — IGA: IgA: 371 mg/dL (ref 68–378)

## 2018-01-25 NOTE — Patient Instructions (Signed)
Your provider has requested that you go to the basement level for lab work before leaving today. Press "B" on the elevator. The lab is located at the first door on the left as you exit the elevator. Complete avoidance of food triggers. Follow FODMAP diet.   You will be notified af age 47  about an appointment for Colonoscopy.( 2021)   If you are age 47 or younger, your body mass index should be between 19-25. Your Body mass index is 43.94 kg/m. If this is out of the aformentioned range listed, please consider follow up with your Primary Care Provider.  Marland Kitchen

## 2018-01-25 NOTE — Progress Notes (Signed)
Subjective:    Patient ID: Joseph Graves, male    DOB: [birth year], 47 y.o.   MRN: [id number]  HPI Joseph Graves is a pleasant 47 year old white male, known to Dr. Christella Hartigan.  He comes in today for follow-up after recent initial office visit 11/27/2017 with Dr. Christella Hartigan for evaluation of intermittent episodes of abdominal cramping, gas and diarrhea. Patient was scheduled for colonoscopy, which he decided not to do. Patient has history of hypertension, intermittent GERD, morbid obesity and sleep apnea. Patient has primary complaint of intolerance to onions over the past several years.  He says the symptoms have gotten progressively worse over the past couple of years.  He also has intolerances to most fried foods and certain gravies.  With any of these things he will tend to get foul tasting belching, gas nausea lower abdominal discomfort and then 1-2 episodes of diarrhea.  Sometimes these episodes happen within an hour of eating and sometimes it several hours after eating.  He is trying his best to avoid onions at this point, reading labels etc.  He did try a low FODMAP diet for about a week and says he did better during that period of time.  He states that he knows that he does not eat right, and knows that some of the things he likes are what caused him GI symptoms. He has not noted any melena or hematochezia. Appetite is good, weight has been stable.  He will occasionally have some heartburn and has been taking ranitidine for that.  No dysphagia or odynophagia No family history of colon cancer.  He does have a cousin with celiac disease. He states that he decided not to proceed with colonoscopy because he has a high deductible plan and really thinks that his issues are related to sensitivities.  Review of Systems Pertinent positive and negative review of systems were noted in the above HPI section.  All other review of systems was otherwise negative.  Outpatient Encounter Medications as of 01/25/2018    Medication Sig  . buPROPion (WELLBUTRIN XL) 150 MG 24 hr tablet Take 2 tablets (300 mg total) by mouth every morning.  . cetirizine (ZYRTEC) 10 MG tablet Take 10 mg by mouth daily.  . Cholecalciferol (VITAMIN D3) 5000 units TABS 5,000 IU OTC vitamin D3 daily. (Patient taking differently: Take 5,000 Units by mouth daily. )  . Cyanocobalamin (VITAMIN B12) 1000 MCG TBCR Take 1,000 mcg by mouth daily.   . fluticasone (FLONASE) 50 MCG/ACT nasal spray Place 2 sprays into both nostrils daily. (Patient taking differently: Place 2 sprays into both nostrils daily as needed for allergies. )  . losartan-hydrochlorothiazide (HYZAAR) 100-12.5 MG tablet TAKE 1 TABLET BY MOUTH DAILY.  . montelukast (SINGULAIR) 10 MG tablet Take 1 tablet (10 mg total) by mouth daily.  . Omega-3 Fatty Acids (FISH OIL) 1200 MG CAPS Take 1,200 mg by mouth daily.   Marland Kitchen OVER THE COUNTER MEDICATION Take 1 tablet by mouth daily. [employer] Mega Men Prostate and Virility Supplement  . Promethazine HCl (PHENERGAN PO) Take by mouth.  . ranitidine (ZANTAC) 150 MG tablet Take 150 mg by mouth daily.  . [DISCONTINUED] methocarbamol (ROBAXIN) 750 MG tablet Take 1-2 tablets (750-1,500 mg total) by mouth 3 (three) times daily as needed for muscle spasms. (Patient not taking: Reported on 12/02/2017)  . [DISCONTINUED] ondansetron (ZOFRAN ODT) 4 MG disintegrating tablet Take 1 tablet (4 mg total) by mouth every 8 (eight) hours as needed for nausea or vomiting. (Patient not taking: Reported on 01/25/2018)  No facility-administered encounter medications on file as of 01/25/2018.    Allergies  Allergen Reactions  . Amoxicillin-Pot Clavulanate Rash  . Penicillins Hives and Other (See Comments)    Has patient had a PCN reaction causing immediate rash, facial/tongue/throat swelling, SOB or lightheadedness with hypotension:  Has patient had a PCN reaction causing severe rash involving mucus membranes or skin necrosis:  Has patient had a PCN reaction that required  hospitalization:  Has patient had a PCN reaction occurring within the last 10 years:  If all of the above answers are "NO", then may proceed with Cephalosporin use.    Patient Active Problem List   Diagnosis Date Noted  . Hypertension 04/27/2017  . GERD (gastroesophageal reflux disease) 04/27/2017  . Environmental and seasonal allergies 04/27/2017  . Vitamin D deficiency 04/27/2017  . Adjustment disorder with depressed mood in remission 04/27/2017  . Obesity, Class III, BMI 40-49.9 (morbid obesity) (HCC) 04/27/2017  . Numbness of toes 04/27/2017  . OSA on CPAP 04/27/2017  . Family history of suicide- father age 31 04/27/2017  . Family history of alcoholism in grandfathers 04/27/2017  . Family history of drug addiction 04/27/2017  . Family history of depression 04/27/2017  . Family history of diet-controlled diabetes- father in 28s 04/27/2017  . Family history of non-Hodgkin's lymphoma- father 04/27/2017   Social History   Socioeconomic History  . Marital status: Married    Spouse name: Not on file  . Number of children: Not on file  . Years of education: Not on file  . Highest education level: Not on file  Occupational History  . Not on file  Social Needs  . Financial resource strain: Not on file  . Food insecurity:    Worry: Not on file    Inability: Not on file  . Transportation needs:    Medical: Not on file    Non-medical: Not on file  Tobacco Use  . Smoking status: Former Smoker    Last attempt to quit: 1992    Years since quitting: 27.3  . Smokeless tobacco: Never Used  Substance and Sexual Activity  . Alcohol use: Yes    Alcohol/week: 0.6 oz    Types: 1 Cans of beer per week  . Drug use: No  . Sexual activity: Yes  Lifestyle  . Physical activity:    Days per week: Not on file    Minutes per session: Not on file  . Stress: Not on file  Relationships  . Social connections:    Talks on phone: Not on file    Gets together: Not on file    Attends  religious service: Not on file    Active member of club or organization: Not on file    Attends meetings of clubs or organizations: Not on file    Relationship status: Not on file  . Intimate partner violence:    Fear of current or ex partner: Not on file    Emotionally abused: Not on file    Physically abused: Not on file    Forced sexual activity: Not on file  Other Topics Concern  . Not on file  Social History Narrative  . Not on file    Mr. Proudfoot's family history includes Alcohol abuse in his maternal grandfather and paternal grandfather; Cancer in his father; Depression in his father; Diabetes in his father and paternal uncle; Hypertension in his maternal grandfather; Stroke in his mother.      Objective:    Vitals:  01/25/18 0845  BP: 128/78  Pulse: 86  SpO2: 99%    Physical Exam; well-developed morbidly obese white male in no acute distress, pleasant, blood pressure 128/78, pulse 86, height 6 foot 4, weight 361, BMI 43.9.  HEENT;nontraumatic normocephalic EOMI PERRLA sclera anicteric, Cardiovascular ;regular rate and rhythm with S1-S2 no murmur rub or gallop, Pulmonary ;clear bilaterally, Abdomen ;obese, soft nontender, bowel sounds are present, there is no palpable mass or hepatosplenomegaly.  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 Symptoms are consistent with IBS and food intolerances  Will rule out celiac disease  #2 colon cancer surveillance-patient is average risk, with no family history of colon cancer #3 morbid obesity  #4  hypertension  Plan; Long discussion today with patient regarding diet, and importance of avoiding his known triggers if he wishes to avoid GI symptoms.  Advised avoiding fast food and fried foods. Offered a trial of antispasmodic which he does not feel he needs at this point Offered food allergy testing--he does not wish to pursue at this time-by history his symptoms are more consistent with intolerance than  true allergy. We will check TTG, IgA and sed rate Patient will need colonoscopy at age 62 for screening.  We will send him a reminder. He can follow-up with Dr. Christella Hartigan or myself on an as-needed basis.   Darlyn Repsher S Ronne Stefanski PA-C 01/25/2018   Cc: Thomasene Lot, DO

## 2018-01-25 NOTE — Progress Notes (Signed)
I agree with the above note, plan 

## 2018-01-26 LAB — TISSUE TRANSGLUTAMINASE, IGA: (tTG) Ab, IgA: 1 U/mL

## 2018-02-12 ENCOUNTER — Other Ambulatory Visit: Payer: Self-pay | Admitting: Family Medicine

## 2018-02-12 DIAGNOSIS — J3089 Other allergic rhinitis: Secondary | ICD-10-CM

## 2018-02-12 MED FILL — MONTELUKAST SOD 10 MG TAB: 10 | 90 days supply | Qty: 90 | Fill #0

## 2018-02-22 ENCOUNTER — Other Ambulatory Visit: Payer: Self-pay | Admitting: Family Medicine

## 2018-02-22 ENCOUNTER — Telehealth: Payer: Self-pay | Admitting: Family Medicine

## 2018-02-22 DIAGNOSIS — F4321 Adjustment disorder with depressed mood: Secondary | ICD-10-CM

## 2018-02-22 NOTE — Telephone Encounter (Signed)
Provider required OV for medication refill--cld patient to establish appt, he will call back to secure a date/ time.  glh

## 2018-02-22 NOTE — Telephone Encounter (Signed)
Noted MPulliam, CMA/RT(R)  

## 2018-03-02 ENCOUNTER — Encounter: Payer: Self-pay | Admitting: Family Medicine

## 2018-03-02 ENCOUNTER — Ambulatory Visit (INDEPENDENT_AMBULATORY_CARE_PROVIDER_SITE_OTHER): Payer: 59 | Admitting: Family Medicine

## 2018-03-02 VITALS — BP 120/82 | HR 84 | Ht 76.0 in | Wt 359.0 lb

## 2018-03-02 DIAGNOSIS — G4733 Obstructive sleep apnea (adult) (pediatric): Secondary | ICD-10-CM

## 2018-03-02 DIAGNOSIS — F329 Major depressive disorder, single episode, unspecified: Secondary | ICD-10-CM | POA: Insufficient documentation

## 2018-03-02 DIAGNOSIS — J3089 Other allergic rhinitis: Secondary | ICD-10-CM | POA: Diagnosis not present

## 2018-03-02 DIAGNOSIS — F4321 Adjustment disorder with depressed mood: Secondary | ICD-10-CM

## 2018-03-02 DIAGNOSIS — I10 Essential (primary) hypertension: Secondary | ICD-10-CM

## 2018-03-02 DIAGNOSIS — Z9989 Dependence on other enabling machines and devices: Secondary | ICD-10-CM

## 2018-03-02 DIAGNOSIS — F32A Depression, unspecified: Secondary | ICD-10-CM | POA: Insufficient documentation

## 2018-03-02 MED ORDER — BUPROPION HCL ER (XL) 150 MG PO TB24: 300.0000 mg | ORAL_TABLET | Freq: Every morning | ORAL | 1 refills | Status: DC

## 2018-03-02 MED ORDER — MONTELUKAST SODIUM 10 MG PO TABS: 10.0000 mg | ORAL_TABLET | Freq: Every day | ORAL | 1 refills | Status: DC

## 2018-03-02 MED ORDER — LOSARTAN POTASSIUM-HCTZ 100-12.5 MG PO TABS: 1.0000 | ORAL_TABLET | Freq: Every day | ORAL | 1 refills | Status: DC

## 2018-03-02 MED FILL — LOSARTAN-HCTZ 100-12.5 MG T: 100-12.5 | 90 days supply | Qty: 90 | Fill #0

## 2018-03-02 MED FILL — buPROPion HCL ER (XL) 150 M: 150 | 90 days supply | Qty: 180 | Fill #0

## 2018-03-02 NOTE — Patient Instructions (Addendum)
Please follow-up in about 3 months for a complete physical including fasting labs.

## 2018-03-02 NOTE — Progress Notes (Signed)
Impression and Recommendations:    1. Adjustment disorder with depressed mood in remission   2. Hypertension, unspecified type   3. Obesity, Class III, BMI 40-49.9 (morbid obesity) (HCC)   4. Environmental and seasonal allergies   5. OSA on CPAP    1. GI Distress due to Certain Foods - Advised patient to keep a food journal of everything he is eating, to look for patterns.  - Patient will continue to monitor symptoms and inform us of any changes.  GERD - Continue taking Zantac to manage GERD.  2. Hypertension - Stable at this time.  No changes made.  Will continue to monitor. - Continue medication and management as prescribed.  No changes made. - Hyzaar refilled today.  - Reviewed target BP as 130/80 or less.  - Lifestyle changes such as dash diet and engaging in a regular exercise program discussed with patient.  Educational handouts provided  - Ambulatory BP monitoring encouraged. Keep log and bring in next OV  3. Mood - Stable at this time.  No changes made.  Will continue to monitor. - Continue medications and management as prescribed. - Wellbutrin refilled today - 2 tablets daily.  - Patient is taking 150 mg Wellbutrin one day, 300 the next.  - Encouraged patient to begin regular exercise to improve his mental health.  4. BMI Counseling - Advised patient to reduce his BMI, ideally from 40-45 range, down to 35-40.  - Emphasized to patient that losing 10-20 lbs will have a huge impact on his quality of life.  Explained to patient what BMI refers to, and what it means medically.    Told patient to think about it as a "medical risk stratification measurement" and how increasing BMI is associated with increasing risk/ or worsening state of various diseases such as hypertension, hyperlipidemia, diabetes, premature OA, depression etc.  American Heart Association guidelines for healthy diet, basically Mediterranean diet, and exercise guidelines of 30 minutes 5 days per  week or more discussed in detail.  Health counseling performed.  All questions answered.  5. Lifestyle & Health Maintenance Environmental/Seasonal Allergies - Singular refilled today.  Exercise & Dietary Habits - Advised patient to continue working toward exercising to improve health.    - Patient will begin with 15 minutes of activity daily.  Recommended that the patient eventually strive for at least 150 minutes of moderate cardiovascular activity per week according to guidelines established by the [facility].   - Healthy dietary habits encouraged, including low-carb, and high amounts of lean protein in diet (chicken, [place], fish).  - Encouraged tracking food intake, using an app like LoseIt or MyFitnessPal.  - Patient should also consume adequate amounts of water - half of body weight in oz of water per day.  6. Follow-Up - Advised patient that if he ever wants to work on weight loss more intensively, he can call into the clinic here and come for specialized follow-up.  - Return in 4-6 months for chronic follow-up.   - Strongly recommended the patient return in the near future for CPE.    Education and routine counseling performed. Handouts provided.  No orders of the defined types were placed in this encounter.   Meds ordered this encounter  Medications  . losartan-hydrochlorothiazide (HYZAAR) 100-12.5 MG tablet    Sig: Take 1 tablet by mouth daily.    Dispense:  90 tablet    Refill:  1  . buPROPion (WELLBUTRIN XL) 150 MG 24 hr tablet  Sig: Take 2 tablets (300 mg total) by mouth every morning. Patient needs office visit for further refills.    Dispense:  180 tablet    Refill:  1  . montelukast (SINGULAIR) 10 MG tablet    Sig: Take 1 tablet (10 mg total) by mouth at bedtime.    Dispense:  90 tablet    Refill:  1    The patient was counseled, risk factors were discussed, anticipatory guidance given.  Gross side effects, risk and benefits, and alternatives of  medications discussed with patient.  Patient is aware that all medications have potential side effects and we are unable to predict every side effect or drug-drug interaction that may occur.  Expresses verbal understanding and consents to current therapy plan and treatment regimen.  Return for CPE/ yrly physical, come fasting 3 mo or so.  Please see AVS handed out to patient at the end of our visit for further patient instructions/ counseling done pertaining to today's office visit.    Note: This document was prepared using Dragon voice recognition software and may include unintentional dictation errors.  This document serves as a record of services personally performed by Thomasene Lot, DO. It was created on her behalf by Peggye Fothergill, a trained medical scribe. The creation of this record is based on the scribe's personal observations and the provider's statements to them.   I have reviewed the above medical documentation for accuracy and completeness and I concur.  Thomasene Lot 03/03/18 10:11 PM    Subjective:    HPI: Joseph Graves is a 47 y.o. male who presents to [facility] today for follow up for HTN.    GI Upset due to Certain Foods Notes that he's going to the doctor on Thursday for allergy testing with [facility].  Notes "I'm sick of this onion thing."  He isn't sure what in particular is causing his GI issues.  Initially thought it was a stomach virus, but notes now that it's some weird combination of what he eats.  Was tested for Celiac but this came back negative.  Patient suspects lactose plays a part in his symptoms, and adds that raw vegetables and salads tend to "tear his stomach up."  Patient continues to take zantac.  Notes that his complaint is less reflux, and more his GI upset.  Back Pain & Occasional Foot Numbness Patient has gotten new shoes to help combat his occasional foot numbness.  Notes that this  occurs from time to time, but is better.  He has been thinking about visiting a chiropractor for assistance at some point.  Weight & Exercise Patient plans on beginning to go to the gym and regularly exercising.  States "the gym is across the street from work" for him.  Lifestyle Patient notes that he loves his job very much.  Mood Notes that his mood is very stable at this time and he feels well on his current medications.  Sleep Apnea Patient is going to [facility] soon to be evaluated for a new CPAP machine.  HTN:  -  His blood pressure is controlled, but he does not check it at home. Notes that he's been to the doctor so much that he's had it measured often.  - Patient reports good compliance with blood pressure medications  - Denies medication S-E   - Smoking Status noted   - He denies new onset of: chest pain, exercise intolerance, shortness of breath, dizziness,  visual changes, headache, lower extremity swelling or claudication.    Last 3 blood pressure readings in our office are as follows: BP Readings from Last 3 Encounters:  03/02/18 120/82  01/25/18 128/78  12/09/17 135/89    Pulse Readings from Last 3 Encounters:  03/02/18 84  01/25/18 86  12/09/17 90    Filed Weights   03/02/18 1526  Weight: (!) 359 lb (162.8 kg)    Depression screen [facility] 2/9 03/02/2018 07/22/2017 07/22/2017 04/27/2017  Decreased Interest 0 0 0 0  Down, Depressed, Hopeless 0 0 0 0  PHQ - 2 Score 0 0 0 0  Altered sleeping 0 1 - -  Tired, decreased energy 0 1 - -  Change in appetite 0 2 - -  Feeling bad or failure about yourself  0 0 - -  Trouble concentrating 1 0 - -  Moving slowly or fidgety/restless 0 0 - -  Suicidal thoughts 0 0 - -  PHQ-9 Score 1 4 - -  Difficult doing work/chores Not difficult at all Not difficult at all - -     Patient Care Team    Relationship Specialty Notifications Start End  Thomasene Lot, DO PCP - General Family Medicine  04/22/17       Lab Results  Component Value Date   CREATININE 0.97 04/27/2017   BUN 17 04/27/2017   NA 143 04/27/2017   K 4.4 04/27/2017   CL 104 04/27/2017   CO2 24 04/27/2017    Lab Results  Component Value Date   CHOL 133 04/27/2017    Lab Results  Component Value Date   HDL 42 04/27/2017    Lab Results  Component Value Date   LDLCALC 75 04/27/2017    Lab Results  Component Value Date   TRIG 78 04/27/2017    Lab Results  Component Value Date   CHOLHDL 3.2 04/27/2017    No results found for: LDLDIRECT ===================================================================   Patient Active Problem List   Diagnosis Date Noted  . Hypertension 04/27/2017    Priority: High  . Adjustment disorder with depressed mood in remission 04/27/2017    Priority: High  . Depression 03/02/2018  . GERD (gastroesophageal reflux disease) 04/27/2017  . Environmental and seasonal allergies 04/27/2017  . Vitamin D deficiency 04/27/2017  . Obesity, Class III, BMI 40-49.9 (morbid obesity) (HCC) 04/27/2017  . Numbness of toes 04/27/2017  . OSA on CPAP 04/27/2017  . Family history of suicide- father age 22 04/27/2017  . Family history of alcoholism in grandfathers 04/27/2017  . Family history of drug addiction 04/27/2017  . Family history of depression 04/27/2017  . Family history of diet-controlled diabetes- father in 37s 04/27/2017  . Family history of non-Hodgkin's lymphoma- father 04/27/2017  . Erectile dysfunction 04/19/2015     Past Medical History:  Diagnosis Date  . Allergy   . GERD (gastroesophageal reflux disease)   . Hypertension      Past Surgical History:  Procedure Laterality Date  . BICEPS TENDON REPAIR  11/2013  . NASAL SINUS SURGERY    . VASECTOMY       Family History  Problem Relation Age of Onset  . Stroke Mother   . Depression Father        suicide  . Cancer Father   . Diabetes Father   . Diabetes Paternal Uncle   . Alcohol abuse Maternal  Grandfather   . Hypertension Maternal Grandfather   . Alcohol abuse Paternal Grandfather      Social History  Substance and Sexual Activity  Drug Use No  ,  Social History   Substance and Sexual Activity  Alcohol Use Yes  . Alcohol/week: 0.6 oz  . Types: 1 Cans of beer per week  ,  Social History   Tobacco Use  Smoking Status Former Smoker  . Last attempt to quit: 1992  . Years since quitting: 27.4  Smokeless Tobacco Never Used  ,    Current Outpatient Medications on File Prior to Visit  Medication Sig Dispense Refill  . cetirizine (ZYRTEC) 10 MG tablet Take 10 mg by mouth daily.    . Cholecalciferol (VITAMIN D3) 5000 units TABS 5,000 IU OTC vitamin D3 daily. (Patient taking differently: Take 5,000 Units by mouth daily. ) 90 tablet 3  . Cyanocobalamin (VITAMIN B12) 1000 MCG TBCR Take 1,000 mcg by mouth daily.     . fluticasone (FLONASE) 50 MCG/ACT nasal spray Place 2 sprays into both nostrils daily. (Patient taking differently: Place 2 sprays into both nostrils daily as needed for allergies. ) 15.8 g 12  . Omega-3 Fatty Acids (FISH OIL) 1200 MG CAPS Take 1,200 mg by mouth daily.     Marland Kitchen. OVER THE COUNTER MEDICATION Take 1 tablet by mouth daily. GNC Mega Men Prostate and Virility Supplement    . Promethazine HCl (PHENERGAN PO) Take by mouth.    . ranitidine (ZANTAC) 150 MG tablet Take 150 mg by mouth daily.     No current facility-administered medications on file prior to visit.      Allergies  Allergen Reactions  . Amoxicillin-Pot Clavulanate Rash  . Penicillins Hives and Other (See Comments)    Has patient had a PCN reaction causing immediate rash, facial/tongue/throat swelling, SOB or lightheadedness with hypotension:  Has patient had a PCN reaction causing severe rash involving mucus membranes or skin necrosis:  Has patient had a PCN reaction that required hospitalization:  Has patient had a PCN reaction occurring within the last 10 years:  If all of the above  answers are "NO", then may proceed with Cephalosporin use.      Review of Systems:   General:  Denies fever, chills Optho/Auditory:   Denies visual changes, blurred vision Respiratory:   Denies SOB, cough, wheeze, DIB  Cardiovascular:   Denies chest pain, palpitations, painful respirations Gastrointestinal:   Denies nausea, vomiting, diarrhea.  Endocrine:     Denies new hot or cold intolerance Musculoskeletal:  Denies joint swelling, gait issues, or new unexplained myalgias/ arthralgias Skin:  Denies rash, suspicious lesions  Neurological:    Denies dizziness, unexplained weakness, numbness  Psychiatric/Behavioral:   Denies mood changes  Objective:    Blood pressure 120/82, pulse 84, height 6\' 4"  (1.93 m), weight (!) 359 lb (162.8 kg), SpO2 98 %.  Body mass index is 43.7 kg/m.  General: Well Developed, well nourished, and in no acute distress.  HEENT: Normocephalic, atraumatic, pupils equal round reactive to light, neck supple, No carotid bruits, no JVD Skin: Warm and dry, cap RF less 2 sec Cardiac: Regular rate and rhythm, S1, S2 WNL's, no murmurs rubs or gallops Respiratory: ECTA B/L, Not using accessory muscles, speaking in full sentences. NeuroM-Sk: Ambulates w/o assistance, moves ext * 4 w/o difficulty, sensation grossly intact.  Ext: scant edema b/l lower ext Psych: No HI/SI, judgement and insight good, Euthymic mood. Full Affect.

## 2018-03-04 ENCOUNTER — Encounter: Payer: Self-pay | Admitting: Allergy & Immunology

## 2018-03-04 ENCOUNTER — Ambulatory Visit (INDEPENDENT_AMBULATORY_CARE_PROVIDER_SITE_OTHER): Payer: 59 | Admitting: Allergy & Immunology

## 2018-03-04 VITALS — BP 132/74 | HR 86 | Resp 20 | Ht 75.0 in | Wt 345.0 lb

## 2018-03-04 DIAGNOSIS — R1084 Generalized abdominal pain: Secondary | ICD-10-CM

## 2018-03-04 DIAGNOSIS — T781XXD Other adverse food reactions, not elsewhere classified, subsequent encounter: Secondary | ICD-10-CM

## 2018-03-04 DIAGNOSIS — J3089 Other allergic rhinitis: Secondary | ICD-10-CM | POA: Diagnosis not present

## 2018-03-04 DIAGNOSIS — J302 Other seasonal allergic rhinitis: Secondary | ICD-10-CM

## 2018-03-04 NOTE — Progress Notes (Signed)
NEW PATIENT  Date of Service/Encounter:  03/04/18  Referring provider: Thomasene Lot, DO   Assessment:   Generalized abdominal pain  Adverse food reaction - multiple foods with reactions that seem more like sensitizations rather than allergies  Seasonal and perennial allergic rhinitis - s/p two rounds of immunotherapy  Plan/Recommendations:   1. Adverse food reaction - multiple triggers - Come back on Monday June 17th at 11:00am for testing.  - We will be able to make further recommendations following the testing. - His symptoms certainly do not sound like an IgE mediated reaction. - One would think that his reactions would be less severe since he is on a chronic antihistamine and a leukotriene receptor antagonist. - In any case, we will do testing next week and determine further steps from there.   2. Return in about 4 days (around 03/08/2018).   Subjective:   Joseph Graves is a 47 y.o. male presenting today for evaluation of  Chief Complaint  Patient presents with  . GI Problem    onions tear his stomach up, raw vegetables, cheese, pasta, dairy.     Val Duaine Graves has a history of the following: Patient Active Problem List   Diagnosis Date Noted  . Depression 03/02/2018  . Hypertension 04/27/2017  . GERD (gastroesophageal reflux disease) 04/27/2017  . Environmental and seasonal allergies 04/27/2017  . Vitamin D deficiency 04/27/2017  . Adjustment disorder with depressed mood in remission 04/27/2017  . Obesity, Class III, BMI 40-49.9 (morbid obesity) (HCC) 04/27/2017  . Numbness of toes 04/27/2017  . OSA on CPAP 04/27/2017  . Family history of suicide- father age 25 04/27/2017  . Family history of alcoholism in grandfathers 04/27/2017  . Family history of drug addiction 04/27/2017  . Family history of depression 04/27/2017  . Family history of diet-controlled diabetes- father in 47s 04/27/2017  . Family history of non-Hodgkin's lymphoma- father 04/27/2017    . Erectile dysfunction 04/19/2015    History obtained from: chart review and patient.  Joseph Graves was referred by Thomasene Lot, DO.     Thadeus is a 47 y.o. male presenting for concern for food allergies. Around the time that he turned 39 or so, he noticed that he would get abdominal pain from onions. He would have diarrhea around 2 in the morning. This has progressively gotten worse. He cannot eat barbecue chips or anything with onion powder.  He has had these reactions with fried foods as well.  He continues to have the problems despite avoiding these foods.   He notes a variety of foods that have triggered these reactions.  His reactions do not always occur in the middle of the night, but can follow the meal by approximately 30 to 45 minutes.  One reaction occurred when he ate meat loaf with barbecue sauce on it.  Another episode occurred after he ate a large bowl of ice cream.  He does not have an EpiPen.  He has never tried taking antihistamines for these reactions, as he takes a cetirizine 10 mg daily in conjunction with montelukast 10 mg daily.  He takes these medications for history of environmental allergies.  He did initially go to see gastroenterology doctor - Dr. Christella Hartigan - in March 2019 where Celiac testing was done which was negative.  He did consider getting a colonoscopy, but canceled it when Dr. Christella Hartigan said he was not certain that this would find the cause for his symptoms.  He has a long-standing history of atopic  disease.  He was followed by Dr. Beaulah DinningBardelas when he was a child.  He was on shots during that time.  He was also on allergy shots more recently around 15 years ago when he [place] in [place].  However, it seems that he had difficulties advancing his dose and therefore he never reached maintenance at that time.  He has been on montelukast as well as cetirizine for a number of years.  Otherwise, there is no history of other atopic diseases, including drug  allergies, stinging insect allergies, or urticaria. There is no significant infectious history. Vaccinations are up to date.    Past Medical History: Patient Active Problem List   Diagnosis Date Noted  . Depression 03/02/2018  . Hypertension 04/27/2017  . GERD (gastroesophageal reflux disease) 04/27/2017  . Environmental and seasonal allergies 04/27/2017  . Vitamin D deficiency 04/27/2017  . Adjustment disorder with depressed mood in remission 04/27/2017  . Obesity, Class III, BMI 40-49.9 (morbid obesity) (HCC) 04/27/2017  . Numbness of toes 04/27/2017  . OSA on CPAP 04/27/2017  . Family history of suicide- father age 48 04/27/2017  . Family history of alcoholism in grandfathers 04/27/2017  . Family history of drug addiction 04/27/2017  . Family history of depression 04/27/2017  . Family history of diet-controlled diabetes- father in 8740s 04/27/2017  . Family history of non-Hodgkin's lymphoma- father 04/27/2017  . Erectile dysfunction 04/19/2015    Medication List:  Allergies as of 03/04/2018      Reactions   Amoxicillin-pot Clavulanate Rash   Penicillins Hives, Other (See Comments)   Has patient had a PCN reaction causing immediate rash, facial/tongue/throat swelling, SOB or lightheadedness with hypotension:  Has patient had a PCN reaction causing severe rash involving mucus membranes or skin necrosis:  Has patient had a PCN reaction that required hospitalization:  Has patient had a PCN reaction occurring within the last 10 years:  If all of the above answers are "NO", then may proceed with Cephalosporin use.      Medication List        Accurate as of 03/04/18  1:45 PM. Always use your most recent med list.          buPROPion 150 MG 24 hr tablet Commonly known as:  WELLBUTRIN XL Take 2 tablets (300 mg total) by mouth every morning. Patient needs office visit for further refills.   cetirizine 10 MG tablet Commonly known as:  ZYRTEC Take 10 mg by mouth daily.   Fish  Oil 1200 MG Caps Take 1,200 mg by mouth daily.   fluticasone 50 MCG/ACT nasal spray Commonly known as:  FLONASE Place 2 sprays into both nostrils daily.   losartan-hydrochlorothiazide 100-12.5 MG tablet Commonly known as:  HYZAAR Take 1 tablet by mouth daily.   montelukast 10 MG tablet Commonly known as:  SINGULAIR Take 1 tablet (10 mg total) by mouth at bedtime.   OVER THE COUNTER MEDICATION Take 1 tablet by mouth daily. GNC Mega Men Prostate and Virility Supplement   PHENERGAN PO Take by mouth.   ranitidine 150 MG tablet Commonly known as:  ZANTAC Take 150 mg by mouth daily.   Vitamin B12 1000 MCG Tbcr Take 1,000 mcg by mouth daily.   Vitamin D3 5000 units Tabs 5,000 IU OTC vitamin D3 daily.       Birth History: non-contributory.    Developmental History: non-contributory.   Past Surgical History: Past Surgical History:  Procedure Laterality Date  . BICEPS TENDON REPAIR  11/2013  .  NASAL SINUS SURGERY    . VASECTOMY       Family History: Family History  Problem Relation Age of Onset  . Stroke Mother   . Depression Father        suicide  . Cancer Father   . Diabetes Father   . Diabetes Paternal Uncle   . Alcohol abuse Maternal Grandfather   . Hypertension Maternal Grandfather   . Alcohol abuse Paternal Grandfather      Social History: Lenorris lives at home with his family.  They live in a house that is 48 years old.  There is carpet and wood in the main living areas and carpeting in the bedrooms.  They have electric heating and central cooling.  There is 1 dog in the home as well as birds outside of the home.  He does have dust mite covers on his bedding.  There is no tobacco exposure in the house for the car.  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     Review of Systems: a 14-point review of systems is pertinent for what is mentioned in HPI.  Otherwise, all other  systems were negative. Constitutional: negative other than that listed in the HPI Eyes: negative other than that listed in the HPI Ears, nose, mouth, throat, and face: negative other than that listed in the HPI Respiratory: negative other than that listed in the HPI Cardiovascular: negative other than that listed in the HPI Gastrointestinal: negative other than that listed in the HPI Genitourinary: negative other than that listed in the HPI Integument: negative other than that listed in the HPI Hematologic: negative other than that listed in the HPI Musculoskeletal: negative other than that listed in the HPI Neurological: negative other than that listed in the HPI Allergy/Immunologic: negative other than that listed in the HPI    Objective:   Blood pressure 132/74, pulse 86, resp. rate 20, height 6\' 3"  (1.905 m), weight (!) 345 lb (156.5 kg), SpO2 98 %. Body mass index is 43.12 kg/m.   Physical Exam:  General: Alert, interactive, in no acute distress. Obese male.  Eyes: No conjunctival injection bilaterally, no discharge on the right, no discharge on the left and no Horner-Trantas dots present. PERRL bilaterally. EOMI without pain. No photophobia.  Ears: Right TM pearly gray with normal light reflex, Left TM pearly gray with normal light reflex, Right TM intact without perforation and Left TM intact without perforation.  Nose/Throat: External nose within normal limits, nasal crease present and septum midline. Turbinates edematous and pale with clear discharge. Posterior oropharynx erythematous without cobblestoning in the posterior oropharynx. Tonsils 2+ without exudates.  Tongue without thrush. Neck: Supple without thyromegaly. Trachea midline. Adenopathy: no enlarged lymph nodes appreciated in the anterior cervical, occipital, axillary, epitrochlear, inguinal, or popliteal regions. Lungs: Clear to auscultation without wheezing, rhonchi or rales. No increased work of  breathing. CV: Normal S1/S2. No murmurs. Capillary refill <2 seconds.  Abdomen: Nondistended, nontender. No guarding or rebound tenderness. Bowel sounds present in all fields and hypoactive. Overall difficult exam due to body habitus.  Skin: Warm and dry, without lesions or rashes. Extremities:  No clubbing, cyanosis or edema. Neuro:   Grossly intact. No focal deficits appreciated. Responsive to questions.  Diagnostic studies: deferred due to recent antihistamine use      Malachi Bonds, MD [facility]

## 2018-03-04 NOTE — Patient Instructions (Addendum)
1. Adverse food reaction - Come back on Monday June 17th at 11:00am for testing.   2. Return in about 4 days (around 03/08/2018).    Please inform us of any Emergency Department visits, hospitalizations, or changes in symptoms. Call us before going to the ED for breathing or allergy symptoms since we might be able to fit you in for a sick visit. Feel free to contact us anytime with any questions, problems, or concerns.  It was a pleasure to meet you today!  Websites that have reliable patient information: 1. American Academy of Asthma, Allergy, and Immunology: www.aaaai.org 2. Food Allergy Research and Education (FARE): foodallergy.org 3. Mothers of Asthmatics: http://www.asthmacommunitynetwork.org 4. American College of Allergy, Asthma, and Immunology: MissingWeapons.cawww.acaai.org   Make sure you are registered to vote!

## 2018-03-08 ENCOUNTER — Encounter: Payer: Self-pay | Admitting: Allergy & Immunology

## 2018-03-08 ENCOUNTER — Ambulatory Visit (INDEPENDENT_AMBULATORY_CARE_PROVIDER_SITE_OTHER): Payer: 59 | Admitting: Allergy & Immunology

## 2018-03-08 VITALS — BP 142/80 | HR 69 | Resp 18

## 2018-03-08 DIAGNOSIS — T781XXD Other adverse food reactions, not elsewhere classified, subsequent encounter: Secondary | ICD-10-CM | POA: Diagnosis not present

## 2018-03-08 NOTE — Progress Notes (Signed)
FOLLOW UP  Date of Service/Encounter:  03/08/18   Assessment:   Generalized abdominal pain  Adverse food reaction - with equivocal testing to chicken today  Seasonal and perennial allergic rhinitis - s/p two rounds of immunotherapy  Possible lactose intolerance   Joseph Graves presents for allergy testing today from his previous visit last week, at which time he had recently taken antihistamines. He has a history of postprandial reactions including generalized abdominal pain. Testing today to his selected foods was rather unrevealing. Upon further discussion with him, it seems that although he reports certain foods are causing his reactions (i.e. onions), when he has these reactions they are breaded. This is the same with items such as chicken. He cannot tolerate fried chicken outside of the home, but when he wife makes it he does fine. Therefore this could be something in the batter itself - such as milk - that is not fully broken down. He also had one episode that improved with the use of Lactaid pills. This seems like a plausible consideration, therefore I recommended that he avoid cow's milk to see how he does with this. At this point, I do not think that we need to see him on a regular basis, therefore I am happy to see him again if symptoms worsen or change.   Plan/Recommendations:   1. Adverse food reaction - Testing was negative to all of the foods tested. - I agree that this could definitely be a manifestation of lactose intolerance. - There is no need for an epinephrine autoinjector at the time. - Email me with an update.  - Another consideration is alpha gal sensitivity, but he does have reactions without exposure to mammalian meat, making this less likely.   2. Return if symptoms worsen or fail to improve.  Subjective:   Joseph Graves is a 47 y.o. male presenting today for follow up of  Chief Complaint  Patient presents with  . Allergy Testing    Food Testing      Joseph Graves has a history of the following: Patient Active Problem List   Diagnosis Date Noted  . Depression 03/02/2018  . Hypertension 04/27/2017  . GERD (gastroesophageal reflux disease) 04/27/2017  . Environmental and seasonal allergies 04/27/2017  . Vitamin D deficiency 04/27/2017  . Adjustment disorder with depressed mood in remission 04/27/2017  . Obesity, Class III, BMI 40-49.9 (morbid obesity) (HCC) 04/27/2017  . Numbness of toes 04/27/2017  . OSA on CPAP 04/27/2017  . Family history of suicide- father age 48 04/27/2017  . Family history of alcoholism in grandfathers 04/27/2017  . Family history of drug addiction 04/27/2017  . Family history of depression 04/27/2017  . Family history of diet-controlled diabetes- father in 36s 04/27/2017  . Family history of non-Hodgkin's lymphoma- father 04/27/2017  . Erectile dysfunction 04/19/2015    History obtained from: chart review and patient.  Joseph Graves Primary Care Provider is Thomasene Lot, DO.     Joseph Graves is a 47 y.o. male presenting for a skin testing. He has a history of adverse food reactions detailed in his note from 03/04/18. Briefly, he reports that her get abdominal pain from a variety of foods. He had seen Dr. Christella Hartigan, who wanted to performed a colonoscopy, but Joseph Graves cancelled it since it was so expensive. Celiac testing had been negative in the past.   Since the last visit, he has done well. He has been off antihistamines for several days now.   Otherwise, there have  been no changes to his past medical history, surgical history, family history, or social history.    Review of Systems: a 14-point review of systems is pertinent for what is mentioned in HPI.  Otherwise, all other systems were negative. Constitutional: negative other than that listed in the HPI Eyes: negative other than that listed in the HPI Ears, nose, mouth, throat, and face: negative other than that listed in the HPI Respiratory:  negative other than that listed in the HPI Cardiovascular: negative other than that listed in the HPI Gastrointestinal: negative other than that listed in the HPI Genitourinary: negative other than that listed in the HPI Integument: negative other than that listed in the HPI Hematologic: negative other than that listed in the HPI Musculoskeletal: negative other than that listed in the HPI Neurological: negative other than that listed in the HPI Allergy/Immunologic: negative other than that listed in the HPI    Objective:   Blood pressure (!) 142/80, pulse 69, resp. rate 18, SpO2 96 %. There is no height or weight on file to calculate BMI.   Physical Exam: deferred since this was a skin testing appointment only  Diagnostic studies:    Allergy Studies:   Selected Food Panel: positive to Chicken (1x1) with adequate controls. Negative to Soy, Wheat, Milk, Casein, Shellfish Mix , Fish Mix, Rice, Pork, Beef, Tomato, White Potato, Onion, Carrots, Celery, Corn, Cucumber, Chocolate, Karaya Gum, Acacia (Arabic Gum) and Garlic   Allergy testing results were read and interpreted by myself, documented by clinical staff.      Joseph BondsJoel Gallagher, MD  [facility]

## 2018-03-08 NOTE — Patient Instructions (Addendum)
1. Adverse food reaction - Testing was negative to all of the foods tested. - I agree that this could definitely be a manifestation of lactose intolerance. - There is no need for an epinephrine autoinjector at the time. - Email me with an update.   2. Return if symptoms worsen or fail to improve.   Please inform us of any Emergency Department visits, hospitalizations, or changes in symptoms. Call us before going to the ED for breathing or allergy symptoms since we might be able to fit you in for a sick visit. Feel free to contact us anytime with any questions, problems, or concerns.  It was a pleasure to see you again today!  Websites that have reliable patient information: 1. American Academy of Asthma, Allergy, and Immunology: www.aaaai.org 2. Food Allergy Research and Education (FARE): foodallergy.org 3. Mothers of Asthmatics: http://www.asthmacommunitynetwork.org 4. American College of Allergy, Asthma, and Immunology: MissingWeapons.cawww.acaai.org   Make sure you are registered to vote!

## 2018-03-09 ENCOUNTER — Encounter: Payer: Self-pay | Admitting: Allergy & Immunology

## 2018-04-05 ENCOUNTER — Encounter: Payer: Self-pay | Admitting: Internal Medicine

## 2018-04-06 ENCOUNTER — Ambulatory Visit (INDEPENDENT_AMBULATORY_CARE_PROVIDER_SITE_OTHER): Payer: 59 | Admitting: Internal Medicine

## 2018-04-06 ENCOUNTER — Encounter: Payer: Self-pay | Admitting: Internal Medicine

## 2018-04-06 VITALS — BP 120/68 | HR 59 | Ht 76.0 in | Wt 362.6 lb

## 2018-04-06 DIAGNOSIS — G4733 Obstructive sleep apnea (adult) (pediatric): Secondary | ICD-10-CM

## 2018-04-06 DIAGNOSIS — Z9989 Dependence on other enabling machines and devices: Secondary | ICD-10-CM | POA: Diagnosis not present

## 2018-04-06 NOTE — Progress Notes (Signed)
04/06/2018-47 year old male former smoker for sleep evaluation. Medical problem list includes allergy, GERD, HBP, obesity, OSA NPSG 04/19/11- AHI 54.9/ hr, desaturation to 80%, body weight 370 lbs -----Sleep Consult: Pt moved here from [place] and needs new DME. Wears CPAP nightly-DL attached.  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 Changed insurance requires change in DME.  Current machine is 28 or 48 years old.  He says CPAP has made his quality of life much better with improved sleep and he uses it all night every night with no break in therapy. Now using nasal mask with heated hose. Mother wears CPAP.  ENT surgery: Sinus surgery, UPPP.  Denies heart or lung disease. Weight today 362 lbs, Epworth Score 3 using CPAP  Prior to Admission medications   Medication Sig Start Date End Date Taking? Authorizing Provider  buPROPion (WELLBUTRIN XL) 150 MG 24 hr tablet Take 2 tablets (300 mg total) by mouth every morning. Patient needs office visit for further refills. 03/02/18  Yes Opalski, Gavin Pound, DO  cetirizine (ZYRTEC) 10 MG tablet Take 10 mg by mouth daily.   Yes [provider]  Cholecalciferol (VITAMIN D3) 5000 units TABS 5,000 IU OTC vitamin D3 daily. Patient taking differently: Take 5,000 Units by mouth daily.  07/22/17  Yes Opalski, Deborah, DO  Cyanocobalamin (VITAMIN B12) 1000 MCG TBCR Take 1,000 mcg by mouth daily.    Yes [provider]  fluticasone (FLONASE) 50 MCG/ACT nasal spray Place 2 sprays into both nostrils daily. Patient taking differently: Place 2 sprays into both nostrils daily as needed for allergies.  09/16/17  Yes Elvina Sidle, MD  losartan-hydrochlorothiazide (HYZAAR) 100-12.5 MG tablet Take 1 tablet by mouth daily. 03/02/18  Yes Opalski, Gavin Pound, DO  montelukast (SINGULAIR) 10 MG tablet Take 1 tablet (10 mg total) by mouth at bedtime. 03/02/18  Yes  Opalski, Gavin Pound, DO  Omega-3 Fatty Acids (FISH OIL) 1200 MG CAPS Take 1,200 mg by mouth daily.    Yes [provider]  OVER THE COUNTER MEDICATION Take 1 tablet by mouth daily. [employer] Mega Men Prostate and Virility Supplement   Yes [provider]   Past Medical History:  Diagnosis Date  . Allergy   . Asthma    outgrew at 16-37 years of age.   Marland Kitchen GERD (gastroesophageal reflux disease)   . Hypertension    Past Surgical History:  Procedure Laterality Date  . BICEPS TENDON REPAIR  11/2013  . NASAL SINUS SURGERY    . VASECTOMY     Family History  Problem Relation Age of Onset  . Stroke Mother   . Allergic rhinitis Mother   . Depression Father        suicide  . Cancer Father   . Diabetes Father   . Allergic rhinitis Father   . Diabetes Paternal Uncle   . Alcohol abuse Maternal Grandfather   . Hypertension Maternal Grandfather   . Alcohol abuse Paternal Grandfather    Social History   Socioeconomic History  . Marital status: Married    Spouse name: Not on file  . Number of children: Not on file  . Years of education: Not on file  . Highest education level: Not on file  Occupational History  . Not on file  Social Needs  . Financial resource strain: Not on file  . Food insecurity:    Worry: Not on file    Inability: Not on file  .  Transportation needs:    Medical: Not on file    Non-medical: Not on file  Tobacco Use  . Smoking status: Former Smoker    Last attempt to quit: 1992    Years since quitting: 27.5  . Smokeless tobacco: Never Used  Substance and Sexual Activity  . Alcohol use: Yes    Alcohol/week: 0.6 oz    Types: 1 Cans of beer per week  . Drug use: No  . Sexual activity: Yes  Lifestyle  . Physical activity:    Days per week: Not on file    Minutes per session: Not on file  . Stress: Not on file  Relationships  . Social connections:    Talks on phone: Not on file    Gets together: Not on file    Attends religious service: Not on  file    Active member of club or organization: Not on file    Attends meetings of clubs or organizations: Not on file    Relationship status: Not on file  . Intimate partner violence:    Fear of current or ex partner: Not on file    Emotionally abused: Not on file    Physically abused: Not on file    Forced sexual activity: Not on file  Other Topics Concern  . Not on file  Social History Narrative  . Not on file   ROS-see HPI   + = positive Constitutional:    weight loss, night sweats, fevers, chills, fatigue, lassitude. HEENT:    headaches, difficulty swallowing, tooth/dental problems, sore throat,       sneezing, itching, ear ache, +nasal congestion, post nasal drip, snoring CV:    chest pain, orthopnea, PND, swelling in lower extremities, anasarca,                                  dizziness, palpitations Resp:   shortness of breath with exertion or at rest.                productive cough,   non-productive cough, coughing up of blood.              change in color of mucus.  wheezing.   Skin:    rash or lesions. GI:  No-   heartburn, indigestion, abdominal pain, nausea, vomiting, diarrhea,                 change in bowel habits, loss of appetite GU: dysuria, change in color of urine, no urgency or frequency.   flank pain. MS:   joint pain, stiffness, decreased range of motion, back pain. Neuro-     nothing unusual Psych:  change in mood or affect.  +depression or anxiety.   memory loss.  OBJ- Physical Exam General- Alert, Oriented, Affect-appropriate, Distress- none acute, + morbid obesity Skin- rash-none, lesions- none, excoriation- none Lymphadenopathy- none Head- atraumatic            Eyes- Gross vision intact, PERRLA, conjunctivae and secretions clear            Ears- Hearing, canals-normal            Nose- Clear, no-Septal dev, mucus, polyps, erosion, perforation             Throat- Mallampati IV , mucosa clear , drainage- none, tonsils- atrophic Neck- flexible , trachea  midline, no stridor , thyroid nl, carotid no bruit Chest - symmetrical excursion ,  unlabored           Heart/CV- RRR , no murmur , no gallop  , no rub, nl s1 s2                           - JVD- none , edema- none, stasis changes- none, varices- none           Lung- clear to P&A, wheeze- none, cough- none , dullness-none, rub- none           Chest wall-  Abd-  Br/ Gen/ Rectal- Not done, not indicated Extrem- cyanosis- none, clubbing, none, atrophy- none, strength- nl Neuro- grossly intact to observation

## 2018-04-06 NOTE — Patient Instructions (Signed)
Order- new DME to establish for OSA. Good compliance, no break in therapy. Please replace old CPAP machine, auto 8-15, mask of choice, heated hose, supplies, AirView  Please call as needed

## 2018-04-06 NOTE — Assessment & Plan Note (Signed)
Excellent compliance and control.  There is been no break in therapy.  His machine is getting old by his report.  This is a good opportunity as he changes to a new DME with his new job and new insurance, to replace the old machine. Plan-continue AutoPap 8-15, replace old CPAP machine and establish with new DME.

## 2018-04-14 DIAGNOSIS — G4733 Obstructive sleep apnea (adult) (pediatric): Secondary | ICD-10-CM | POA: Diagnosis not present

## 2018-04-19 ENCOUNTER — Telehealth: Payer: Self-pay | Admitting: Internal Medicine

## 2018-04-19 NOTE — Telephone Encounter (Signed)
Spoke with patient. He has been scheduled to see Elisha HeadlandBrian Mack NP on 06/15/18 at 9am. He is aware of appointment. Nothing further needed at time of call.

## 2018-05-15 DIAGNOSIS — G4733 Obstructive sleep apnea (adult) (pediatric): Secondary | ICD-10-CM | POA: Diagnosis not present

## 2018-05-25 MED FILL — MONTELUKAST SOD 10 MG TAB: 10 | 90 days supply | Qty: 90 | Fill #1

## 2018-05-31 MED FILL — buPROPion HCL ER (XL) 150 M: 150 | 90 days supply | Qty: 180 | Fill #1

## 2018-06-07 ENCOUNTER — Ambulatory Visit (INDEPENDENT_AMBULATORY_CARE_PROVIDER_SITE_OTHER): Payer: 59 | Admitting: Family Medicine

## 2018-06-07 ENCOUNTER — Encounter: Payer: Self-pay | Admitting: Family Medicine

## 2018-06-07 VITALS — BP 122/83 | HR 57 | Ht 76.0 in | Wt 347.0 lb

## 2018-06-07 DIAGNOSIS — Z0000 Encounter for general adult medical examination without abnormal findings: Secondary | ICD-10-CM

## 2018-06-07 DIAGNOSIS — F4321 Adjustment disorder with depressed mood: Secondary | ICD-10-CM | POA: Diagnosis not present

## 2018-06-07 DIAGNOSIS — E559 Vitamin D deficiency, unspecified: Secondary | ICD-10-CM | POA: Diagnosis not present

## 2018-06-07 DIAGNOSIS — Z8639 Personal history of other endocrine, nutritional and metabolic disease: Secondary | ICD-10-CM | POA: Insufficient documentation

## 2018-06-07 NOTE — Progress Notes (Signed)
Male physical  Impression and Recommendations:    1. Encounter for wellness examination   2. Vitamin D deficiency   3. Adjustment disorder with depressed mood in remission   4. Morbidly obese (HCC)   5. History of non anemic vitamin B12 deficiency     Lab work -Ordered fasting labs -Will check Vitamin D and Vitamin B12 due to previous deficiency  Colon Health -Discussed use of stool cards for preliminary colon cancer screening -Discussed necessity of colonoscopy screening at 50   Eyes -Discussed the importance of more frequent eye appointments after turning 40 -Discussed use of bifocals to help with difficulty seeing items up close  Stress -Discussed the importance of managing stress in work -Estate agent -Encouraged pt to find a Education officer, community -Discussed health guidelines for dental cleaning every 6 months  Skin -Encouraged pt to begin doing skin checks  -Discussed areas that often have higher instances of skin damage such as the tops of ears  Sexual Health -Offered Testerone blood test; pt declined -Educated pt on impact of weight on libido in men and women -Discussed impact of stress on libido  -Educated pt on decrease of erectile function with long term lack of use -Discussed healthy ranges of testosterone for adult men -Educated pt on positive impact of weight loss on libido  Weight: -Encouraged pt to looking to weight management groups run by [employer]  -Discussed methods to look into for weight management such as Weight Watchers and the [employer] -Discussed the impact of obesity on bp, diabetes, and joint pain -Encouraged pt to continue changing his diet and lifestyle to lose weight -Discussed the positive impact of tracking food in order to help with losing weight -Educated pt on the possible benefit of the Keto diet  Genital health -Discussed methods for testicular self exam and red flag symptoms to look out for in testes -Discussed importance of  regular self testing to improve possibility of early detection of changes -Discussed red flag symptoms and changes to look for in penis during self exam -Educated pt on need for prostate e  Foot Numbness -Instructed pt to note sleeping differences that impact numbness -Asked pt to make a journal of changes that aggravate foot numbness -Discussed possible treatments depending on what activities aggravate numbness -Discussed possibility of orthotic inserts to help support foot structure  Anticipatory Guidance: Discussed importance of wearing a seatbelt while driving, not texting while driving;   sunscreen when outside along with skin surveillance; eating a balanced and modest diet; physical activity at least 25 minutes per day or 150 min/ week moderate to intense activity.  Immunizations / Screenings / Labs:  All immunizations are up-to-date per recommendations or will be updated today. Patient is due for dental and vision screens which pt will schedule independently. Will obtain CBC, CMP, HgA1c, Lipid panel, TSH and vit D when fasting, if not already done recently.   Gross side effects, risk and benefits, and alternatives of medications discussed with patient.  Patient is aware that all medications have potential side effects and we are unable to predict every side effect or drug-drug interaction that may occur.  Expresses verbal understanding and consents to current therapy plan and treatment regimen.  Please see AVS handed out to patient at the end of our visit for further patient instructions/ counseling done pertaining to today's office visit.  Follow-up preventative CPE in 1 year. Follow-up office visit pending lab work.  F/up sooner for chronic care management and/or prn  This document serves as a record of services personally performed by Thomasene Loteborah Mal Asher, MD. It was created on her behalf by Alphonse GuildLauren Pearson, a trained medical scribe. The creation of this record is based on the scribe's  personal observations and the provider's statements to them.   I have reviewed the above medical documentation for accuracy and completeness and I concur.  Thomasene LotDeborah Parthiv Mucci 06/10/18 4:17 PM    Subjective:     CC: CPE  HPI: Joseph Graves is a 47 y.o. male who [facility] a yearly health maintenance exam.    Health Maintenance Summary Reviewed and updated, unless pt declines services.  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 to prevent suicide -Has been helping with his friend's app to help prevent veteran suicide  Eyes -PT states it has been almost a year since his last eye exam  -States he has started using bifocals and has to hold objects a little further away  Dental Health -Pt states he doesn't go to the dentist because he doesn't have time -He has not been recently  Vitamin D/B12 -Has a history of Vitamin B12 and D deficiency -Pt has been taking his 5000 IU supplements of vitamin D each day -Pt is also taking B12 supplements  Weight -Pt has lost almost 15 lbs in 78 days -Pt has lost 41 lbs in the last two years -States he has been working towards eating smaller portions  Sexual Health -Pt states he has been struggling with sexual desire -Noticed it has been waxing and waning  -Believes it is not due to job or work stress -Previously has had issues with erectile dysfunction but states it is mostly a lack of desire now  Family History -No family history of early colon cancer  Sleep -[name] for his CPAP machine -Has no complaints -States he just got a new one roughly 3 months ago -Has to do repeated testing for 10 months until it is paid for   Foot numbness -Pt states some nights he has numbness and heaviness of his foot after laying in certain positions -States he hasn't noticed any patterns during sleep that lead to numbness -Pt says it has not  been bother him "too much" but he was curious about causes -Noticed it starts when he lays on his back, turning on his side alleviates short term but then begins again with prolonged sitting on his side -Denies movement of numbness beyond calf  Tobacco History Reviewed:   no Alcohol:    No concerns, no excessive use Exercise Habits:   none STD concerns:   none Drug Use:   None Birth control method:   n/a Testicular/penile concerns:     no   Immunization History  Administered Date(s) Administered  . Influenza Split 08/08/2005  . Influenza Whole 06/08/2018  . Influenza, Seasonal, Injecte, Preservative Fre 06/14/2015  . Tdap 04/19/2015    Health Maintenance  Topic Date Due  . Janet BerlinETANUS/TDAP  04/18/2025  . INFLUENZA VACCINE  Completed  . HIV Screening  Completed      Wt Readings from Last 3 Encounters:  06/15/18 (!) 351 lb (159.2 kg)  06/07/18 (!) 347 lb (157.4 kg)  04/06/18 (!) 362 lb 9.6 oz (164.5 kg)   BP Readings from Last 3 Encounters:  06/15/18 126/80  06/07/18 122/83  04/06/18 120/68   Pulse Readings from Last 3 Encounters:  06/15/18 70  06/07/18 (!) 57  04/06/18 (!) 59    Patient Active Problem List   Diagnosis Date Noted  . Hypertension 04/27/2017    Priority: High  . Adjustment disorder with depressed mood in remission 04/27/2017    Priority: High  . History of non anemic vitamin B12 deficiency 06/07/2018  . Depression 03/02/2018  . GERD (gastroesophageal reflux disease) 04/27/2017  . Environmental and seasonal allergies 04/27/2017  . Vitamin D deficiency 04/27/2017  . Obesity, Class III, BMI 40-49.9 (morbid obesity) (HCC) 04/27/2017  . Numbness of toes 04/27/2017  . OSA on CPAP 04/27/2017  . Family history of suicide- father age 14 04/27/2017  . Family history of alcoholism in grandfathers 04/27/2017  . Family history of drug addiction 04/27/2017  . Family history of depression 04/27/2017  . Family history of diet-controlled diabetes- father in 50s  04/27/2017  . Family history of non-Hodgkin's lymphoma- father 04/27/2017  . Erectile dysfunction 04/19/2015    Past Medical History:  Diagnosis Date  . Allergy   . Asthma    outgrew at 17-37 years of age.   Marland Kitchen GERD (gastroesophageal reflux disease)   . Hypertension     Past Surgical History:  Procedure Laterality Date  . BICEPS TENDON REPAIR  11/2013  . NASAL SINUS SURGERY    . VASECTOMY      Family History  Problem Relation Age of Onset  . Stroke Mother   . Allergic rhinitis Mother   . Depression Father        suicide  . Cancer Father   . Diabetes Father   . Allergic rhinitis Father   . Diabetes Paternal Uncle   . Alcohol abuse Maternal Grandfather   . Hypertension Maternal Grandfather   . Alcohol abuse Paternal Grandfather     Social History   Substance and Sexual Activity  Drug Use No  ,  Social History   Substance and Sexual Activity  Alcohol Use Yes  . Alcohol/week: 1.0 standard drinks  . Types: 1 Cans of beer per week  ,  Social History   Tobacco Use  Smoking Status Former Smoker  . Packs/day: 0.25  . Years: 4.50  . Pack years: 1.12  . Last attempt to quit: 1992  . Years since quitting: 27.7  Smokeless Tobacco Never Used  ,  Social History   Substance and Sexual Activity  Sexual Activity Yes    Patient's Medications  New Prescriptions   No medications on file  Previous Medications   BUPROPION (WELLBUTRIN XL) 150 MG 24 HR TABLET    Take 2 tablets (300 mg total) by mouth every morning. Patient needs office visit for further refills.   CETIRIZINE (ZYRTEC) 10 MG TABLET    Take 10 mg by mouth daily.   CHOLECALCIFEROL (VITAMIN D3) 5000 UNITS TABS    5,000 IU OTC vitamin D3 daily.   CYANOCOBALAMIN (VITAMIN B12) 1000 MCG TBCR    Take 1,000 mcg by mouth daily.    FLUTICASONE (FLONASE) 50 MCG/ACT NASAL SPRAY    Place 2 sprays into both nostrils daily.   LOSARTAN-HYDROCHLOROTHIAZIDE (HYZAAR) 100-12.5 MG TABLET    Take 1 tablet by mouth daily.    MONTELUKAST (SINGULAIR) 10 MG TABLET    Take 1 tablet (10 mg total) by mouth at bedtime.   OMEGA-3 FATTY ACIDS (FISH OIL) 1200 MG CAPS    Take 1,200 mg by mouth daily.    OVER THE COUNTER MEDICATION    Take 1 tablet by mouth daily. [employer] Mega Men Prostate and Virility Supplement  Modified  Medications   No medications on file  Discontinued Medications   No medications on file    Amoxicillin-pot clavulanate and Penicillins  Review of Systems: General:   Denies fever, chills, unexplained weight loss.  Optho/Auditory:   Denies visual changes, blurred vision/LOV Respiratory:   Denies SOB, DOE more than baseline levels.  Cardiovascular:   Denies chest pain, palpitations, new onset peripheral edema  Gastrointestinal:   Denies nausea, vomiting, diarrhea.  Genitourinary: Denies dysuria, freq/ urgency, flank pain or discharge from genitals.  Endocrine:     Denies hot or cold intolerance, polyuria, polydipsia. Musculoskeletal:   Denies unexplained myalgias, joint swelling, unexplained arthralgias, gait problems.  Skin:  Denies rash, suspicious lesions Neurological:     Denies dizziness, unexplained weakness, numbness  Psychiatric/Behavioral:   Denies mood changes, suicidal or homicidal ideations, hallucinations    Objective:     Blood pressure 122/83, pulse (!) 57, height 6\' 4"  (1.93 m), weight (!) 347 lb (157.4 kg), SpO2 99 %. Body mass index is 42.24 kg/m. General Appearance:    Alert, cooperative, no distress, appears stated age  Head:    Normocephalic, without obvious abnormality, atraumatic  Eyes:    PERRL, conjunctiva/corneas clear, EOM's intact, fundi    benign, both eyes  Ears:    Normal TM's and external ear canals, both ears  Nose:   Nares normal, septum midline, mucosa normal, no drainage    or sinus tenderness  Throat:   Lips w/o lesion, mucosa moist, and tongue normal; teeth and   gums normal  Neck:   Supple, symmetrical, trachea midline, no adenopathy;    thyroid:  no  enlargement/tenderness/nodules; no carotid   bruit or JVD  Back:     Symmetric, no curvature, ROM normal, no CVA tenderness  Lungs:     Clear to auscultation bilaterally, respirations unlabored, no       Wh/ R/ R  Chest Wall:    No tenderness or gross deformity; normal excursion   Heart:    Regular rate and rhythm, S1 and S2 normal, no murmur, rub   or gallop  Abdomen:     Soft, non-tender, bowel sounds active all four quadrants, NO G/R/R, no masses, no organomegaly  Genitalia:    Ext genitalia: without lesion, no penile rash or discharge, no hernias appreciated   Rectal:    Normal tone, prostate WNL's and equal b/l, no tenderness; guaiac negative stool  Extremities:   Extremities normal, atraumatic, no cyanosis or gross edema  Pulses:   2+ and symmetric all extremities  Skin:   Warm, dry, Skin color, texture, turgor normal, no obvious rashes or lesions  M-Sk:   Ambulates * 4 w/o difficulty, no gross deformities, tone WNL  Neurologic:   CNII-XII intact, normal strength, sensation and reflexes    Throughout Psych:  No HI/SI, judgement and insight good, Euthymic mood. Full Affect.

## 2018-06-07 NOTE — Patient Instructions (Signed)

## 2018-06-08 LAB — CBC WITH DIFFERENTIAL/PLATELET
Basophils Absolute: 0 10*3/uL (ref 0.0–0.2)
Basos: 1 %
EOS (ABSOLUTE): 0.2 10*3/uL (ref 0.0–0.4)
Eos: 3 %
Hematocrit: 42.4 % (ref 37.5–51.0)
Hemoglobin: 14.2 g/dL (ref 13.0–17.7)
Immature Grans (Abs): 0 10*3/uL (ref 0.0–0.1)
Immature Granulocytes: 0 %
Lymphocytes Absolute: 1.7 10*3/uL (ref 0.7–3.1)
Lymphs: 28 %
MCH: 30.2 pg (ref 26.6–33.0)
MCHC: 33.5 g/dL (ref 31.5–35.7)
MCV: 90 fL (ref 79–97)
Monocytes Absolute: 0.4 10*3/uL (ref 0.1–0.9)
Monocytes: 7 %
Neutrophils Absolute: 3.8 10*3/uL (ref 1.4–7.0)
Neutrophils: 61 %
Platelets: 311 10*3/uL (ref 150–450)
RBC: 4.7 x10E6/uL (ref 4.14–5.80)
RDW: 13.3 % (ref 12.3–15.4)
WBC: 6.1 10*3/uL (ref 3.4–10.8)

## 2018-06-08 LAB — COMPREHENSIVE METABOLIC PANEL
ALT: 28 IU/L (ref 0–44)
AST: 21 IU/L (ref 0–40)
Albumin/Globulin Ratio: 2 (ref 1.2–2.2)
Albumin: 4.7 g/dL (ref 3.5–5.5)
Alkaline Phosphatase: 79 IU/L (ref 39–117)
BUN/Creatinine Ratio: 15 (ref 9–20)
BUN: 16 mg/dL (ref 6–24)
Bilirubin Total: 0.6 mg/dL (ref 0.0–1.2)
CO2: 20 mmol/L (ref 20–29)
Calcium: 9.5 mg/dL (ref 8.7–10.2)
Chloride: 103 mmol/L (ref 96–106)
Creatinine, Ser: 1.06 mg/dL (ref 0.76–1.27)
GFR calc Af Amer: 95 mL/min/{1.73_m2} (ref >59)
GFR calc non Af Amer: 83 mL/min/{1.73_m2} (ref >59)
Globulin, Total: 2.3 g/dL (ref 1.5–4.5)
Glucose: 96 mg/dL (ref 65–99)
Potassium: 4.1 mmol/L (ref 3.5–5.2)
Sodium: 143 mmol/L (ref 134–144)
Total Protein: 7 g/dL (ref 6.0–8.5)

## 2018-06-08 LAB — LIPID PANEL
Chol/HDL Ratio: 3.6 ratio (ref 0.0–5.0)
Cholesterol, Total: 138 mg/dL (ref 100–199)
HDL: 38 mg/dL — ABNORMAL LOW (ref >39)
LDL Calculated: 81 mg/dL (ref 0–99)
Triglycerides: 97 mg/dL (ref 0–149)
VLDL Cholesterol Cal: 19 mg/dL (ref 5–40)

## 2018-06-08 LAB — HEMOGLOBIN A1C
Est. average glucose Bld gHb Est-mCnc: 111 mg/dL
Hgb A1c MFr Bld: 5.5 % (ref 4.8–5.6)

## 2018-06-08 LAB — TSH: TSH: 2.42 u[IU]/mL (ref 0.450–4.500)

## 2018-06-08 LAB — HEPATITIS C ANTIBODY: Hep C Virus Ab: <0.1 s/co ratio (ref 0.0–0.9)

## 2018-06-08 LAB — HIV ANTIBODY (ROUTINE TESTING W REFLEX): HIV Screen 4th Generation wRfx: NONREACTIVE

## 2018-06-08 LAB — VITAMIN D 25 HYDROXY (VIT D DEFICIENCY, FRACTURES): Vit D, 25-Hydroxy: 41.8 ng/mL (ref 30.0–100.0)

## 2018-06-08 LAB — VITAMIN B12: Vitamin B-12: 625 pg/mL (ref 232–1245)

## 2018-06-14 NOTE — Progress Notes (Signed)
@Patient  ID: Joseph Graves, male    DOB: Jan 29, 1971, 47 y.o.   MRN: [id number]  Chief Complaint  Patient presents with  . Follow-up    CPAP follow-up    Referring provider: Thomasene Lotpalski, Deborah, DO  HPI:  47 year old male former smoker followed in our office for obstructive sleep apnea  PMH: GERD, hypertension, obesity, previous sinus surgery Smoker/ Smoking History: Former smoker Maintenance: None Pt of: Dr. Maple HudsonYoung  06/15/2018  - Visit   47 year old patient presenting today for CPAP follow-up visit.  Patient with no complaints.  Mask fitting well.  No issues with CPAP.  Patient reports its going well and that he cannot sleep without his CPAP.  Patient even has battery pack to take CPAP camping.  CPAP compliance report showing 30 out of 30 days use, all 30 of those days greater than 4 hours.  Average usage 8 hours and 12 minutes.  APAP settings 8-15, AHI 1.7.  No significant leaks.  Patient is a [employer], [employer] employee and has already received the flu vaccine.   Tests:   NPSG 04/19/11- AHI 54.9/ hr, desaturation to 80%, body weight 370 lbs   06/07/2018-TSH-2.42  Chart Review:     Specialty Problems      Pulmonary Problems   OSA on CPAP    NPSG 04/19/11- AHI 54.9/ hr, desaturation to 80%, body weight 370 lbs         Allergies  Allergen Reactions  . Amoxicillin-Pot Clavulanate Rash  . Penicillins Hives and Other (See Comments)    Has patient had a PCN reaction causing immediate rash, facial/tongue/throat swelling, SOB or lightheadedness with hypotension:  Has patient had a PCN reaction causing severe rash involving mucus membranes or skin necrosis:  Has patient had a PCN reaction that required hospitalization:  Has patient had a PCN reaction occurring within the last 10 years:  If all of the above answers are "NO", then may proceed with Cephalosporin use.     Immunization History  Administered Date(s) Administered  . Influenza Split 08/08/2005  . Influenza Whole  06/08/2018  . Influenza, Seasonal, Injecte, Preservative Fre 06/14/2015  . Tdap 04/19/2015   Patient is a [employer], [employer] employee and has received a flu vaccine this season  Past Medical History:  Diagnosis Date  . Allergy   . Asthma    outgrew at [age] years of age.   Marland Kitchen. GERD (gastroesophageal reflux disease)   . Hypertension     Tobacco History: Social History   Tobacco Use  Smoking Status Former Smoker  . Packs/day: 0.25  . Years: 4.50  . Pack years: 1.12  . Last attempt to quit: 1992  . Years since quitting: 27.7  Smokeless Tobacco Never Used   Counseling given: Yes  Continue not smoking  Outpatient Encounter Medications as of 06/15/2018  Medication Sig  . buPROPion (WELLBUTRIN XL) 150 MG 24 hr tablet Take 2 tablets (300 mg total) by mouth every morning. Patient needs office visit for further refills.  . cetirizine (ZYRTEC) 10 MG tablet Take 10 mg by mouth daily.  . Cholecalciferol (VITAMIN D3) 5000 units TABS 5,000 IU OTC vitamin D3 daily. (Patient taking differently: Take 5,000 Units by mouth daily. )  . Cyanocobalamin (VITAMIN B12) 1000 MCG TBCR Take 1,000 mcg by mouth daily.   . fluticasone (FLONASE) 50 MCG/ACT nasal spray Place 2 sprays into both nostrils daily. (Patient taking differently: Place 2 sprays into both nostrils daily as needed for allergies. )  . losartan-hydrochlorothiazide (HYZAAR) 100-12.5 MG tablet Take 1  tablet by mouth daily.  . montelukast (SINGULAIR) 10 MG tablet Take 1 tablet (10 mg total) by mouth at bedtime.  . Omega-3 Fatty Acids (FISH OIL) 1200 MG CAPS Take 1,200 mg by mouth daily.   Marland Kitchen OVER THE COUNTER MEDICATION Take 1 tablet by mouth daily. [employer] Mega Men Prostate and Virility Supplement   No facility-administered encounter medications on file as of 06/15/2018.      Review of Systems  Review of Systems  Constitutional: Negative for activity change, chills, fatigue, fever and unexpected weight change.  HENT: Negative for postnasal drip,  rhinorrhea, sinus pressure, sinus pain, sneezing and sore throat.   Eyes: Negative.   Respiratory: Negative for cough, shortness of breath and wheezing.   Cardiovascular: Negative for chest pain and palpitations.  Gastrointestinal: Negative for constipation, diarrhea, nausea and vomiting.  Endocrine: Negative.   Musculoskeletal: Negative.   Skin: Negative.   Neurological: Negative for dizziness and headaches.  Psychiatric/Behavioral: Negative.  Negative for dysphoric mood. The patient is not nervous/anxious.   All other systems reviewed and are negative.    Physical Exam  BP 126/80 (BP Location: Left Wrist, Cuff Size: Normal)   Pulse 70   Ht 6\' 4"  (1.93 m)   Wt (!) 351 lb (159.2 kg)   SpO2 98%   BMI 42.73 kg/m   Wt Readings from Last 5 Encounters:  06/15/18 (!) 351 lb (159.2 kg)  06/07/18 (!) 347 lb (157.4 kg)  04/06/18 (!) 362 lb 9.6 oz (164.5 kg)  03/04/18 (!) 345 lb (156.5 kg)  03/02/18 (!) 359 lb (162.8 kg)     Physical Exam  Constitutional: He is oriented to person, place, and time and well-developed, well-nourished, and in no distress. No distress.  HENT:  Head: Normocephalic and atraumatic.  Right Ear: Hearing, tympanic membrane, external ear and ear canal normal.  Left Ear: Hearing, tympanic membrane, external ear and ear canal normal.  Nose: Nose normal. Right sinus exhibits no maxillary sinus tenderness and no frontal sinus tenderness. Left sinus exhibits no maxillary sinus tenderness and no frontal sinus tenderness.  Mouth/Throat: Uvula is midline and oropharynx is clear and moist. No oropharyngeal exudate.  mallampati II  Eyes: Pupils are equal, round, and reactive to light.  Neck: Normal range of motion. Neck supple. No JVD present.  Cardiovascular: Normal rate, regular rhythm and normal heart sounds.  Pulmonary/Chest: Effort normal and breath sounds normal. No accessory muscle usage. No respiratory distress. He has no decreased breath sounds. He has no  wheezes. He has no rhonchi.  Musculoskeletal: Normal range of motion. He exhibits no edema.  Lymphadenopathy:    He has no cervical adenopathy.  Neurological: He is alert and oriented to person, place, and time. Gait normal.  Skin: Skin is warm and dry. He is not diaphoretic. No erythema.  Psychiatric: Mood, memory, affect and judgment normal.  Nursing note and vitals reviewed.     Lab Results:  CBC    Component Value Date/Time   WBC 6.1 06/07/2018 0854   RBC 4.70 06/07/2018 0854   HGB 14.2 06/07/2018 0854   HCT 42.4 06/07/2018 0854   PLT 311 06/07/2018 0854   MCV 90 06/07/2018 0854   MCH 30.2 06/07/2018 0854   MCHC 33.5 06/07/2018 0854   RDW 13.3 06/07/2018 0854   LYMPHSABS 1.7 06/07/2018 0854   EOSABS 0.2 06/07/2018 0854   BASOSABS 0.0 06/07/2018 0854    BMET    Component Value Date/Time   NA 143 06/07/2018 0854   K 4.1  06/07/2018 0854   CL 103 06/07/2018 0854   CO2 20 06/07/2018 0854   GLUCOSE 96 06/07/2018 0854   BUN 16 06/07/2018 0854   CREATININE 1.06 06/07/2018 0854   CALCIUM 9.5 06/07/2018 0854   GFRNONAA 83 06/07/2018 0854   GFRAA 95 06/07/2018 0854    BNP No results found for: BNP  ProBNP No results found for: PROBNP  Imaging: No results found.    Assessment & Plan:   Pleasant 48 year old patient seen office visit today.  Patient doing well on CPAP therapy. Patient to continue CPAP therapy at this time.  Follow-up in 1 year  OSA on CPAP We recommend that you continue using your CPAP daily >>>Keep up the hard work using your device >>> Goal should be wearing this for the entire night that you are sleeping, at least 4 to 6 hours  Remember:  . Do not drive or operate heavy machinery if tired or drowsy.  . Please notify the supply company and office if you are unable to use your device regularly due to missing supplies or machine being broken.  . Work on maintaining a healthy weight and following your recommended nutrition plan  . Maintain  proper daily exercise and movement  . Maintaining proper use of your device can also help improve management of other chronic illnesses such as: Blood pressure, blood sugars, and weight management.   BiPAP/ CPAP Cleaning:  >>>Clean weekly, with Dawn soap, and bottle brush.  Set up to air dry.       Coral Ceo, NP 06/15/2018

## 2018-06-15 ENCOUNTER — Encounter: Payer: Self-pay | Admitting: Pulmonary Disease

## 2018-06-15 ENCOUNTER — Ambulatory Visit (INDEPENDENT_AMBULATORY_CARE_PROVIDER_SITE_OTHER): Payer: 59 | Admitting: Pulmonary Disease

## 2018-06-15 DIAGNOSIS — Z9989 Dependence on other enabling machines and devices: Secondary | ICD-10-CM

## 2018-06-15 DIAGNOSIS — G4733 Obstructive sleep apnea (adult) (pediatric): Secondary | ICD-10-CM

## 2018-06-15 NOTE — Assessment & Plan Note (Signed)

## 2018-06-15 NOTE — Patient Instructions (Signed)
Follow-up in 1 year  We recommend that you continue using your CPAP daily >>>Keep up the hard work using your device >>> Goal should be wearing this for the entire night that you are sleeping, at least 4 to 6 hours  Remember:  . Do not drive or operate heavy machinery if tired or drowsy.  . Please notify the supply company and office if you are unable to use your device regularly due to missing supplies or machine being broken.  . Work on maintaining a healthy weight and following your recommended nutrition plan  . Maintain proper daily exercise and movement  . Maintaining proper use of your device can also help improve management of other chronic illnesses such as: Blood pressure, blood sugars, and weight management.   BiPAP/ CPAP Cleaning:  >>>Clean weekly, with Dawn soap, and bottle brush.  Set up to air dry.     It is flu season:   >>>Remember to be washing your hands regularly, using hand sanitizer, be careful to use around herself with has contact with people who are sick will increase her chances of getting sick yourself. >>> Best ways to protect herself from the flu: Receive the yearly flu vaccine, practice good hand hygiene washing with soap and also using hand sanitizer when available, eat a nutritious meals, get adequate rest, hydrate appropriately    As of 07/26/2018 we will be moving! We will no longer be at our [place] location.   Our new address and phone number will be:  [address] [place], [place] [address] Telephone number: [phone]   Please contact the office if your symptoms worsen or you have concerns that you are not improving.   Thank you for choosing [facility] for your healthcare, and for allowing [place] to partner with you on your healthcare journey. I am thankful to be able to provide care to you today.   Joseph Headland FNP-C    CPAP and BiPAP Information CPAP and BiPAP are methods of helping a person breathe with the use of air  pressure. CPAP stands for "continuous positive airway pressure." BiPAP stands for "bi-level positive airway pressure." In both methods, air is blown through your nose or mouth and into your air passages to help you breathe well. CPAP and BiPAP use different amounts of pressure to blow air. With CPAP, the amount of pressure stays the same while you breathe in and out. With BiPAP, the amount of pressure is increased when you breathe in (inhale) so that you can take larger breaths. Your health care provider will recommend whether CPAP or BiPAP would be more helpful for you. Why are CPAP and BiPAP treatments used? CPAP or BiPAP can be helpful if you have:  Sleep apnea.  Chronic obstructive pulmonary disease (COPD).  Heart failure.  Medical conditions that weaken the muscles of the chest including muscular dystrophy, or neurological diseases such as amyotrophic lateral sclerosis (ALS).  Other problems that cause breathing to be weak, abnormal, or difficult.  CPAP is most commonly used for obstructive sleep apnea (OSA) to keep the airways from collapsing when the muscles relax during sleep. How is CPAP or BiPAP administered? Both CPAP and BiPAP are provided by a small machine with a flexible plastic tube that attaches to a plastic mask. You wear the mask. Air is blown through the mask into your nose or mouth. The amount of pressure that is used to blow the air can be adjusted on the machine. Your health care  provider will determine the pressure setting that should be used based on your individual needs. When should CPAP or BiPAP be used? In most cases, the mask only needs to be worn during sleep. Generally, the mask needs to be worn throughout the night and during any daytime naps. People with certain medical conditions may also need to wear the mask at other times when they are awake. Follow instructions from your health care provider about when to use the machine. What are some tips for using the  mask?  Because the mask needs to be snug, some people feel trapped or closed-in (claustrophobic) when first using the mask. If you feel this way, you may need to get used to the mask. One way to do this is by holding the mask loosely over your nose or mouth and then gradually applying the mask more snugly. You can also gradually increase the amount of time that you use the mask.  Masks are available in various types and sizes. Some fit over your mouth and nose while others fit over just your nose. If your mask does not fit well, talk with your health care provider about getting a different one.  If you are using a mask that fits over your nose and you tend to breathe through your mouth, a chin strap may be applied to help keep your mouth closed.  The CPAP and BiPAP machines have alarms that may sound if the mask comes off or develops a leak.  If you have trouble with the mask, it is very important that you talk with your health care provider about finding a way to make the mask easier to tolerate. Do not stop using the mask. Stopping the use of the mask could have a negative impact on your health. What are some tips for using the machine?  Place your CPAP or BiPAP machine on a secure table or stand near an electrical outlet.  Know where the on/off switch is located on the machine.  Follow instructions from your health care provider about how to set the pressure on your machine and when you should use it.  Do not eat or drink while the CPAP or BiPAP machine is on. Food or fluids could get pushed into your lungs by the pressure of the CPAP or BiPAP.  Do not smoke. Tobacco smoke residue can damage the machine.  For home use, CPAP and BiPAP machines can be rented or purchased through home health care companies. Many different brands of machines are available. Renting a machine before purchasing may help you find out which particular machine works well for you.  Keep the CPAP or BiPAP machine and  attachments clean. Ask your health care provider for specific instructions. Get help right away if:  You have redness or open areas around your nose or mouth where the mask fits.  You have trouble using the CPAP or BiPAP machine.  You cannot tolerate wearing the CPAP or BiPAP mask.  You have pain, discomfort, and bloating in your abdomen. Summary  CPAP and BiPAP are methods of helping a person breathe with the use of air pressure.  Both CPAP and BiPAP are provided by a small machine with a flexible plastic tube that attaches to a plastic mask.  If you have trouble with the mask, it is very important that you talk with your health care provider about finding a way to make the mask easier to tolerate. This information is not intended to replace advice given to  you by your health care provider. Make sure you discuss any questions you have with your health care provider. Document Released: 06/06/2004 Document Revised: 07/28/2016 Document Reviewed: 07/28/2016 Elsevier Interactive Patient Education  2017 [employer]

## 2018-06-21 MED FILL — LOSARTAN-HCTZ 100-12.5 MG T: 100-12.5 | 30 days supply | Qty: 30 | Fill #1

## 2018-06-28 DIAGNOSIS — H52223 Regular astigmatism, bilateral: Secondary | ICD-10-CM | POA: Diagnosis not present

## 2018-06-28 DIAGNOSIS — H524 Presbyopia: Secondary | ICD-10-CM | POA: Diagnosis not present

## 2018-07-15 DIAGNOSIS — G4733 Obstructive sleep apnea (adult) (pediatric): Secondary | ICD-10-CM | POA: Diagnosis not present

## 2018-07-26 MED FILL — LOSARTAN-HCTZ 100-12.5 MG T: 100-12.5 | 30 days supply | Qty: 30 | Fill #2

## 2018-08-07 ENCOUNTER — Ambulatory Visit: Payer: Self-pay | Admitting: Family Medicine

## 2018-08-07 VITALS — BP 130/88 | HR 74 | Temp 98.3°F | Wt 356.6 lb

## 2018-08-07 DIAGNOSIS — J329 Chronic sinusitis, unspecified: Secondary | ICD-10-CM

## 2018-08-07 MED ORDER — DOXYCYCLINE HYCLATE 100 MG PO TABS: 100.0000 mg | ORAL_TABLET | Freq: Two times a day (BID) | ORAL | 0 refills | Status: AC

## 2018-08-07 NOTE — Patient Instructions (Signed)

## 2018-08-07 NOTE — Progress Notes (Signed)
Joseph EvertsDarren E Graves is a 47 y.o. male who presents today with concerns of sinus pain and congestion for the last 7 days. He has used multiple over the counter medications to treat his symptoms with only minor relief. He has seasonal allergies that he uses Zyrtec and Flonase for and Hypertension he reports is currently well controlled. He reports 2 household contacts with similar symptoms.  Review of Systems  Constitutional: Negative for chills, fever and malaise/fatigue.  HENT: Positive for congestion and sinus pain. Negative for ear discharge, ear pain and sore throat.   Eyes: Negative.   Respiratory: Negative for cough, sputum production and shortness of breath.   Cardiovascular: Negative.  Negative for chest pain.  Gastrointestinal: Negative for abdominal pain, diarrhea, nausea and vomiting.  Genitourinary: Negative for dysuria, frequency, hematuria and urgency.  Musculoskeletal: Negative for myalgias.  Skin: Negative.   Neurological: Negative for headaches.  Endo/Heme/Allergies: Negative.   Psychiatric/Behavioral: Negative.     O: Vitals:   08/07/18 1032  BP: 130/88  Pulse: 74  Temp: 98.3 F (36.8 C)  SpO2: 96%     Physical Exam  Constitutional: He is oriented to person, place, and time. Vital signs are normal. He appears well-developed and well-nourished. He is active.  Non-toxic appearance. He does not have a sickly appearance.  HENT:  Head: Normocephalic.  Right Ear: Hearing, external ear and ear canal normal. A middle ear effusion is present.  Left Ear: Hearing, external ear and ear canal normal. A middle ear effusion is present.  Nose: Rhinorrhea present. Right sinus exhibits maxillary sinus tenderness and frontal sinus tenderness. Left sinus exhibits frontal sinus tenderness.  Mouth/Throat: Uvula is midline. Posterior oropharyngeal erythema present.  Neck: Normal range of motion. Neck supple.  Cardiovascular: Normal rate, regular rhythm, normal heart sounds and normal  pulses.  Pulmonary/Chest: Effort normal and breath sounds normal.  Abdominal: Soft. Bowel sounds are normal.  Musculoskeletal: Normal range of motion.  Lymphadenopathy:       Head (right side): No submental and no submandibular adenopathy present.       Head (left side): No submental and no submandibular adenopathy present.    He has no cervical adenopathy.  Neurological: He is alert and oriented to person, place, and time.  Psychiatric: He has a normal mood and affect.  Vitals reviewed.  A: 1. Sinusitis, unspecified chronicity, unspecified location    P: Exam findings, diagnosis etiology and medication use and indications reviewed with patient. Follow- Up and discharge instructions provided. No emergent/urgent issues found on exam.  Patient verbalized understanding of information provided and agrees with plan of care (POC), all questions answered.  1. Sinusitis, unspecified chronicity, unspecified location - doxycycline (VIBRA-TABS) 100 MG tablet; Take 1 tablet (100 mg total) by mouth 2 (two) times daily for 7 days.

## 2018-08-09 ENCOUNTER — Telehealth: Payer: Self-pay

## 2018-08-09 NOTE — Telephone Encounter (Signed)
I was no able to contacted the patient. 

## 2018-08-15 ENCOUNTER — Encounter: Payer: Self-pay | Admitting: Family

## 2018-08-15 ENCOUNTER — Ambulatory Visit: Payer: Self-pay | Admitting: Family

## 2018-08-15 VITALS — BP 130/90 | HR 67 | Temp 98.3°F | Wt 353.0 lb

## 2018-08-15 DIAGNOSIS — G4733 Obstructive sleep apnea (adult) (pediatric): Secondary | ICD-10-CM | POA: Diagnosis not present

## 2018-08-15 DIAGNOSIS — L255 Unspecified contact dermatitis due to plants, except food: Secondary | ICD-10-CM

## 2018-08-15 MED ORDER — PREDNISONE 10 MG (21) PO TBPK
ORAL_TABLET | ORAL | 0 refills | Status: DC
Start: 2018-08-15 — End: 2019-09-26

## 2018-08-15 MED ORDER — TRIAMCINOLONE ACETONIDE 0.5 % EX OINT: 1.0000 "application " | TOPICAL_OINTMENT | Freq: Two times a day (BID) | CUTANEOUS | 0 refills | Status: DC

## 2018-08-15 NOTE — Progress Notes (Signed)
   Subjective:    Patient ID: Joseph Graves, male    DOB: 21-Dec-1970, 47 y.o.   MRN: [id number]  Chief Complaint  Patient presents with  . Rash   Pt presents to the office today with complaints of a rash left foot and left forearm after cutting bushes in his yard two weeks ago.  Rash  This is a new problem. The current episode started 1 to 4 weeks ago. The problem has been gradually worsening since onset. The affected locations include the left foot. The rash is characterized by redness, swelling, itchiness and blistering. Associated with: was outside cutting bushes two weeks ago. Pertinent negatives include no congestion, cough, diarrhea, fatigue, fever, shortness of breath or sore throat. Past treatments include antibiotics and antibiotic cream (pt took doxycyclinefor a sinus infection). The treatment provided no relief.      Review of Systems  Constitutional: Negative for fatigue and fever.  HENT: Negative for congestion and sore throat.   Respiratory: Negative for cough and shortness of breath.   Gastrointestinal: Negative for diarrhea.  Skin: Positive for rash.  All other systems reviewed and are negative.      Objective:   Physical Exam  Constitutional: He is oriented to person, place, and time. He appears well-developed and well-nourished. No distress.  HENT:  Head: Normocephalic.  Eyes: Pupils are equal, round, and reactive to light. Right eye exhibits no discharge. Left eye exhibits no discharge.  Neck: Normal range of motion. Neck supple. No thyromegaly present.  Cardiovascular: Normal rate, regular rhythm, normal heart sounds and intact distal pulses.  No murmur heard. Pulmonary/Chest: Effort normal and breath sounds normal. No respiratory distress. He has no wheezes.  Abdominal: Soft. Bowel sounds are normal. He exhibits no distension. There is no tenderness.  Musculoskeletal: Normal range of motion. He exhibits no edema or tenderness.  Neurological: He is alert and  oriented to person, place, and time. He has normal reflexes. No cranial nerve deficit.  Skin: Skin is warm and dry. Rash noted.     circular erythemas, vesicular  rash with on dorsum foot approx 3.5X2.2, linear vesicular rash on left forearm  Psychiatric: He has a normal mood and affect. His behavior is normal. Judgment and thought content normal.  Vitals reviewed.        BP 130/90   Pulse 67   Temp 98.3 F (36.8 C)   SpO2 98%      Assessment & Plan:  Joseph Graves comes in today with chief complaint of Rash   Diagnosis and orders addressed:  1. Contact dermatitis due to plant Do not scratch Good hand hygiene discussed I do not believe this is a spider bite since it did not respond to the doxycycline and he has similar rash on forarm RTO if symptoms worsen or do not improve  - predniSONE (STERAPRED UNI-PAK 21 TAB) 10 MG (21) TBPK tablet; Use as directed  Dispense: 21 tablet; Refill: 0 - triamcinolone ointment (KENALOG) 0.5 %; Apply 1 application topically 2 (two) times daily.  Dispense: 30 g; Refill: 0   Jannifer Rodneyhristy Hawks, FNP

## 2018-08-15 NOTE — Patient Instructions (Signed)

## 2018-08-24 DIAGNOSIS — G4733 Obstructive sleep apnea (adult) (pediatric): Secondary | ICD-10-CM | POA: Diagnosis not present

## 2018-08-25 MED FILL — traMADol HCL 50 MG TABS: 50 | 2 days supply | Qty: 10 | Fill #0

## 2018-08-25 MED FILL — IBUPROFEN 800 MG TAB: 800 | 4 days supply | Qty: 16 | Fill #0

## 2018-08-25 MED FILL — CLINDAMYCIN HCL 150 MG CAPS: 150 | 6 days supply | Qty: 25 | Fill #0

## 2018-08-29 MED FILL — LOSARTAN-HCTZ 100-12.5 MG T: 100-12.5 | 30 days supply | Qty: 30 | Fill #3

## 2018-08-30 ENCOUNTER — Other Ambulatory Visit: Payer: Self-pay | Admitting: Family Medicine

## 2018-08-30 DIAGNOSIS — F4321 Adjustment disorder with depressed mood: Secondary | ICD-10-CM

## 2018-08-30 MED FILL — MONTELUKAST SOD 10 MG TAB: 10 | 90 days supply | Qty: 90 | Fill #0

## 2018-08-30 MED FILL — buPROPion HCL ER (XL) 150 M: 150 | 90 days supply | Qty: 180 | Fill #0

## 2018-10-01 MED FILL — LOSARTAN POTASSIUM 100 MG T: 100 | 30 days supply | Qty: 30 | Fill #0

## 2018-10-01 MED FILL — HYDROCHLOROTHIAZIDE 12.5 MG: 12.5 | 90 days supply | Qty: 90 | Fill #0

## 2018-11-01 MED FILL — LOSARTAN POTASSIUM 100 MG T: 100 | 30 days supply | Qty: 30 | Fill #1

## 2018-11-30 MED FILL — MONTELUKAST SOD 10 MG TAB: 10 | 90 days supply | Qty: 90 | Fill #1

## 2018-11-30 MED FILL — LOSARTAN POTASSIUM 100 MG T: 100 | 30 days supply | Qty: 30 | Fill #2

## 2018-12-24 ENCOUNTER — Other Ambulatory Visit: Payer: Self-pay | Admitting: Family Medicine

## 2018-12-28 ENCOUNTER — Ambulatory Visit (INDEPENDENT_AMBULATORY_CARE_PROVIDER_SITE_OTHER): Payer: 59 | Admitting: Family Medicine

## 2018-12-28 ENCOUNTER — Other Ambulatory Visit: Payer: Self-pay

## 2018-12-28 VITALS — BP 133/82 | HR 68 | Ht 74.0 in | Wt 356.0 lb

## 2018-12-28 DIAGNOSIS — I10 Essential (primary) hypertension: Secondary | ICD-10-CM | POA: Diagnosis not present

## 2018-12-28 DIAGNOSIS — J3089 Other allergic rhinitis: Secondary | ICD-10-CM

## 2018-12-28 DIAGNOSIS — F4321 Adjustment disorder with depressed mood: Secondary | ICD-10-CM | POA: Diagnosis not present

## 2018-12-28 MED ORDER — BUPROPION HCL ER (XL) 150 MG PO TB24: 300.0000 mg | ORAL_TABLET | Freq: Every day | ORAL | 1 refills | Status: DC

## 2018-12-28 MED ORDER — LOSARTAN POTASSIUM-HCTZ 100-12.5 MG PO TABS: 1.0000 | ORAL_TABLET | Freq: Every day | ORAL | 1 refills | Status: DC

## 2018-12-28 MED FILL — HYDROCHLOROTHIAZIDE 12.5 MG: 12.5 | 90 days supply | Qty: 90 | Fill #0

## 2018-12-28 MED FILL — buPROPion HCL ER (XL) 150 M: 150 | 90 days supply | Qty: 180 | Fill #0

## 2018-12-28 MED FILL — LOSARTAN POTASSIUM 100 MG T: 100 | 30 days supply | Qty: 30 | Fill #0

## 2018-12-28 NOTE — Progress Notes (Signed)
Virtual Visit via Telephone Note for [employer], D.O- Primary Care Physician at [facility]   I connected with current patient today by telephone and verified that I am speaking with the correct person using two identifiers.   Because of federal recommendations of social distancing due to the current novel COVID-19 outbreak, an audio/video telehealth visit is felt to be most appropriate for this patient at this time.  My staff members also discussed with the patient that there may be a patient charge related to this service.   The patient expressed understanding, and agreed to proceed.    History of Present Illness:  BP- 130's/ 80's regularly- consistently.  Denies HA, CP, SOB, dizziness, heart palp, DIB, vis changes etc. on losartan hydrochlorothiazide.  Mood- well controlled- going into work - [employer].  Remains on Wellbutrin.  No complaints or concerns.  Seasonal allergies -  Flonase zyrtec and singulair, no complaints.  Patient states well controlled on current regiment.    Wt Readings from Last 3 Encounters:  12/28/18 (!) 356 lb (161.5 kg)  08/15/18 (!) 353 lb (160.1 kg)  08/07/18 (!) 356 lb 9.6 oz (161.8 kg)    BP Readings from Last 3 Encounters:  12/28/18 133/82  08/15/18 130/90  08/07/18 130/88    Pulse Readings from Last 3 Encounters:  12/28/18 68  08/15/18 67  08/07/18 74    BMI Readings from Last 3 Encounters:  12/28/18 45.71 kg/m  08/15/18 42.97 kg/m  08/07/18 43.41 kg/m      -Vitals obtained; Medications, allergies reconciled;  personal medical, social, Sx etc. etc. histories were updated by Leda Min the medical assistant today and are reflected in below chart   Patient Care Team    Relationship Specialty Notifications Start End  Thomasene Lot, DO PCP - General Family Medicine  04/22/17      Patient Active Problem List   Diagnosis Date Noted  . Hypertension 04/27/2017    Priority: High  . Adjustment disorder with  depressed mood in remission 04/27/2017    Priority: High  . Environmental and seasonal allergies 04/27/2017    Priority: Low  . History of non anemic vitamin B12 deficiency 06/07/2018  . Depression 03/02/2018  . GERD (gastroesophageal reflux disease) 04/27/2017  . Vitamin D deficiency 04/27/2017  . Obesity, Class III, BMI 40-49.9 (morbid obesity) (HCC) 04/27/2017  . Numbness of toes 04/27/2017  . OSA on CPAP 04/27/2017  . Family history of suicide- father age 70 04/27/2017  . Family history of alcoholism in grandfathers 04/27/2017  . Family history of drug addiction 04/27/2017  . Family history of depression 04/27/2017  . Family history of diet-controlled diabetes- father in 55s 04/27/2017  . Family history of non-Hodgkin's lymphoma- father 04/27/2017  . Erectile dysfunction 04/19/2015     No outpatient medications have been marked as taking for the 12/28/18 encounter (Office Visit) with Thomasene Lot, DO.     Allergies:  Allergies  Allergen Reactions  . Amoxicillin-Pot Clavulanate Rash  . Penicillins Hives and Other (See Comments)    Has patient had a PCN reaction causing immediate rash, facial/tongue/throat swelling, SOB or lightheadedness with hypotension:  Has patient had a PCN reaction causing severe rash involving mucus membranes or skin necrosis:  Has patient had a PCN reaction that required hospitalization:  Has patient had a PCN reaction occurring within the last 10 years:  If all of the above answers are "NO", then may proceed with Cephalosporin use.  ROS:  See above HPI for pertinent positives and negatives   Objective:   Blood pressure 133/82, pulse 68, height 6\' 2"  (1.88 m), weight (!) 356 lb (161.5 kg). General: sounds in no acute distress.  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  -Blood pressure well controlled, continue home monitoring, refill medicines.  -Also discussed exercise, meditation etc. during these more stressful times to help with his mood.  He denies need for increase in dose at this time.  Sleeping well.  -Reminded patient to do Nettie pot twice daily followed by 1 spray Flonase in addition to current regimen if needed.  I discussed the assessment and treatment plan with the patient. The patient was provided an opportunity to ask questions and all were answered.  - The patient agreed with the plan and demonstrated an understanding of the instructions.   No barriers to understanding were identified.  Red flag symptoms and signs discussed in detail.  Patient expressed understanding regarding what to do in case of emergency\urgent symptoms   The patient was advised to call back or seek an in-person evaluation if the symptoms worsen or if the condition fails to improve as anticipated.   Return for Hypertension, Mood follow up every 4 mo.     Meds ordered this encounter  Medications  . losartan-hydrochlorothiazide (HYZAAR) 100-12.5 MG tablet    Sig: Take 1 tablet by mouth daily.    Dispense:  90 tablet    Refill:  1  . buPROPion (WELLBUTRIN XL) 150 MG 24 hr tablet    Sig: Take 2 tablets (300 mg total) by mouth daily.    Dispense:  180 tablet    Refill:  1    Medications Discontinued During This Encounter  Medication Reason  . losartan-hydrochlorothiazide (HYZAAR) 100-12.5 MG tablet Reorder  . buPROPion  (WELLBUTRIN XL) 150 MG 24 hr tablet Reorder     Gross side effects, risk and benefits, and alternatives of medications and treatment plan in general discussed with patient.  Patient is aware that all medications have potential side effects and we are unable to predict every side effect or drug-drug interaction that may occur.   Patient was strongly encouraged to call with any questions or concerns they may have concerns.     I provided 13+ minutes of non-face-to-face time during this encounter.   Thomasene Loteborah Helana Macbride, DO

## 2019-01-22 MED FILL — LOSARTAN POTASSIUM 100 MG T: 100 | 30 days supply | Qty: 30 | Fill #1

## 2019-02-03 ENCOUNTER — Other Ambulatory Visit: Payer: Self-pay

## 2019-02-03 ENCOUNTER — Encounter: Payer: 59 | Admitting: Internal Medicine

## 2019-02-28 ENCOUNTER — Other Ambulatory Visit: Payer: Self-pay | Admitting: Family Medicine

## 2019-02-28 DIAGNOSIS — J3089 Other allergic rhinitis: Secondary | ICD-10-CM

## 2019-02-28 MED FILL — MONTELUKAST SOD 10 MG TAB: 10 | 90 days supply | Qty: 90 | Fill #0

## 2019-02-28 MED FILL — LOSARTAN POTASSIUM 100 MG T: 100 | 30 days supply | Qty: 30 | Fill #2

## 2019-03-10 MED FILL — MONTELUKAST SOD 10 MG TAB: 10 | 90 days supply | Qty: 90 | Fill #0

## 2019-03-22 DIAGNOSIS — G4733 Obstructive sleep apnea (adult) (pediatric): Secondary | ICD-10-CM | POA: Diagnosis not present

## 2019-03-31 MED FILL — LOSARTAN POTASSIUM 100 MG T: 100 | 30 days supply | Qty: 30 | Fill #3

## 2019-03-31 MED FILL — HYDROCHLOROTHIAZIDE 12.5 MG: 12.5 | 90 days supply | Qty: 90 | Fill #1

## 2019-04-07 ENCOUNTER — Ambulatory Visit: Payer: 59 | Admitting: Internal Medicine

## 2019-04-28 MED FILL — LOSARTAN POTASSIUM 100 MG T: 100 | 30 days supply | Qty: 30 | Fill #4

## 2019-06-01 MED FILL — LOSARTAN POTASSIUM 100 MG T: 100 | 30 days supply | Qty: 30 | Fill #5

## 2019-06-01 MED FILL — MONTELUKAST SOD 10 MG TAB: 10 | 90 days supply | Qty: 90 | Fill #1

## 2019-07-01 ENCOUNTER — Other Ambulatory Visit: Payer: Self-pay | Admitting: Family Medicine

## 2019-07-01 MED FILL — LOSARTAN POTASSIUM 100 MG T: 100 | 30 days supply | Qty: 30 | Fill #0

## 2019-07-01 MED FILL — HYDROCHLOROTHIAZIDE 12.5 MG: 12.5 | 30 days supply | Qty: 30 | Fill #0

## 2019-07-08 MED FILL — buPROPion HCL ER (XL) 150 M: 150 | 90 days supply | Qty: 180 | Fill #1
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
## 2019-07-27 MED FILL — LOSARTAN POTASSIUM 100 MG T: 100 | 30 days supply | Qty: 30 | Fill #1

## 2019-07-27 MED FILL — HYDROCHLOROTHIAZIDE 12.5 MG: 12.5 | 30 days supply | Qty: 30 | Fill #1

## 2019-09-05 ENCOUNTER — Telehealth: Payer: Self-pay | Admitting: Family Medicine

## 2019-09-05 ENCOUNTER — Other Ambulatory Visit: Payer: Self-pay | Admitting: Family Medicine

## 2019-09-05 DIAGNOSIS — J3089 Other allergic rhinitis: Secondary | ICD-10-CM

## 2019-09-05 MED FILL — HYDROCHLOROTHIAZIDE 12.5 MG: 12.5 | 30 days supply | Qty: 30 | Fill #0

## 2019-09-05 MED FILL — LOSARTAN POTASSIUM 100 MG T: 100 | 15 days supply | Qty: 15 | Fill #0

## 2019-09-05 MED FILL — MONTELUKAST SOD 10 MG TAB: 10 | 30 days supply | Qty: 30 | Fill #0

## 2019-09-05 NOTE — Telephone Encounter (Signed)
Can you please call this patient and schedule him an apt for med refills. Please advise him that he has already been given 30 day supply and notified and I just gave him 15 day supply. No further refills until patient is seen per refill policy.   Thank you. AS, CMA

## 2019-09-19 ENCOUNTER — Encounter: Payer: Self-pay | Admitting: Family Medicine

## 2019-09-20 ENCOUNTER — Other Ambulatory Visit: Payer: Self-pay | Admitting: Family Medicine

## 2019-09-26 ENCOUNTER — Ambulatory Visit (INDEPENDENT_AMBULATORY_CARE_PROVIDER_SITE_OTHER): Payer: 59 | Admitting: Family Medicine

## 2019-09-26 ENCOUNTER — Other Ambulatory Visit: Payer: Self-pay

## 2019-09-26 ENCOUNTER — Encounter: Payer: Self-pay | Admitting: Family Medicine

## 2019-09-26 VITALS — BP 133/78 | Ht 76.0 in | Wt 370.0 lb

## 2019-09-26 DIAGNOSIS — N521 Erectile dysfunction due to diseases classified elsewhere: Secondary | ICD-10-CM | POA: Diagnosis not present

## 2019-09-26 DIAGNOSIS — F4321 Adjustment disorder with depressed mood: Secondary | ICD-10-CM | POA: Diagnosis not present

## 2019-09-26 DIAGNOSIS — Z8639 Personal history of other endocrine, nutritional and metabolic disease: Secondary | ICD-10-CM

## 2019-09-26 DIAGNOSIS — J3089 Other allergic rhinitis: Secondary | ICD-10-CM | POA: Diagnosis not present

## 2019-09-26 DIAGNOSIS — I10 Essential (primary) hypertension: Secondary | ICD-10-CM | POA: Diagnosis not present

## 2019-09-26 DIAGNOSIS — R200 Anesthesia of skin: Secondary | ICD-10-CM

## 2019-09-26 DIAGNOSIS — E559 Vitamin D deficiency, unspecified: Secondary | ICD-10-CM | POA: Diagnosis not present

## 2019-09-26 DIAGNOSIS — G4733 Obstructive sleep apnea (adult) (pediatric): Secondary | ICD-10-CM | POA: Diagnosis not present

## 2019-09-26 DIAGNOSIS — E349 Endocrine disorder, unspecified: Secondary | ICD-10-CM | POA: Diagnosis not present

## 2019-09-26 DIAGNOSIS — E66813 Obesity, class 3: Secondary | ICD-10-CM

## 2019-09-26 DIAGNOSIS — Z9989 Dependence on other enabling machines and devices: Secondary | ICD-10-CM

## 2019-09-26 DIAGNOSIS — R202 Paresthesia of skin: Secondary | ICD-10-CM

## 2019-09-26 MED ORDER — TADALAFIL 20 MG PO TABS: 10.0000 mg | ORAL_TABLET | Freq: Every day | ORAL | 1 refills | Status: DC | PRN

## 2019-09-26 MED ORDER — HYDROCHLOROTHIAZIDE 12.5 MG PO CAPS: 12.5000 mg | ORAL_CAPSULE | Freq: Every day | ORAL | 1 refills | Status: DC

## 2019-09-26 MED ORDER — LOSARTAN POTASSIUM 100 MG PO TABS: 100.0000 mg | ORAL_TABLET | Freq: Every day | ORAL | 1 refills | Status: DC

## 2019-09-26 MED FILL — TADALAFIL 20 MG TABS: 20 | 6 days supply | Qty: 6 | Fill #0

## 2019-09-26 MED FILL — LOSARTAN POTASSIUM 100 MG T: 100 | 90 days supply | Qty: 90 | Fill #0

## 2019-09-26 NOTE — Progress Notes (Signed)
Virtual / live video office visit note for Joseph Graves, D.O- [facility]   I connected with current patient today and beyond visually recognizing the correct individual, I verified that I am speaking with the correct person using two identifiers.  . Location of the patient: Home . Location of the provider: Office Only the patient (+/- their family members at pt's discretion) and myself were participating in the encounter    - This visit type was conducted due to national recommendations for restrictions regarding the COVID-19 Pandemic (e.g. social distancing) in an effort to limit this patient's exposure and mitigate transmission in our community.  This format is felt to be most appropriate for this patient at this time.   - The patient did have access to video technology today  - No physical exam could be performed with this format, beyond that communicated to us by the patient/ family members as noted.   - Additionally my office staff/ schedulers discussed with the patient that there may be a monetary charge related to this service, depending on patient's medical insurance.   The patient expressed understanding, and agreed to proceed.      History of Present Illness: Hypertension   I, Joseph Graves, am serving as scribe for Dr. Thomasene Loteborah Delroy Graves.   Notes he's been home for the last month.  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   - Weight He has not been exercising lately.  Thinks he's back at about 370; got down to 360-something for a while, but gained it back.   - Mood Management Denies concerns with mood.  Has been ONLY taking one tablet of bupropion daily since last OV. Pt changed dose on his own.    Feels emotions are  controlled and he is able to focus on work.  Notes "this COVID thing is starting to wear on me a little bit, but it's wearing on everyone."  Denies ho seizures.   - Vitamin D Deficiency Notes doing well on Vitamin D supplementation.  Isn't sure if his energy levels are better or not.   - Environmental & Seasonal Allergies Continues taking Singulair.  Notes this year has been tough for him with allergies, experiencing a lot of headaches and congestion.  He continues Zyrtec, and a little Xyzal now and then.  Notes "The Xyzal makes me kinda sleepy, I take it at night."   - OSA on CPAP Notes going good.  Believes he last had his machine interrogated in May.  Continues use nightly.   - Toe Numbness Notes he experiences numbness and tingling in his feet during the day.  "I can tell that they're tingling, but it's not annoying."  If he's sitting in a chair, his left leg experiences symptoms, and "then at night when I lay down, my toes may hurt and get numb at night."  States it's "just numb," denies sensation of cold.  His symptoms improve when he is up and moving on his feet.  Says "it feels like your foot is kind of trying to go to sleep."   - Erectile Concerns Patient curious about options for Viagra / Cialis today.  They have tried Viagra in the past, and patient states "  you kind of have to plan, and with two kids, you don't really get to plan."  Feels his main concern is with regard to maintaining his erection.  States "it becomes an anxiety thing, and even when you would've made it, you talk yourself out of it."   HPI:  Hypertension: -  His blood pressure at home has been running: states it's been "pretty good," running in the mid 130's over the high seventies, low eighties.  - Patient reports good compliance with medication and/or lifestyle modification  - His denies acute concerns or problems related to treatment plan  - He denies new onset of: chest pain, exercise  intolerance, shortness of breath, dizziness, visual changes, headache, lower extremity swelling or claudication.   Last 3 blood pressure readings in our office are as follows: BP Readings from Last 3 Encounters:  09/26/19 133/78  12/28/18 133/82  08/15/18 130/90   Filed Weights   09/26/19 1306  Weight: (!) 370 lb (167.8 kg)    Depression screen [facility] 2/9 09/26/2019 06/07/2018 03/02/2018 07/22/2017 07/22/2017  Decreased Interest 0 0 0 0 0  Down, Depressed, Hopeless 0 0 0 0 0  PHQ - 2 Score 0 0 0 0 0  Altered sleeping 0 0 0 1 -  Tired, decreased energy 0 0 0 1 -  Change in appetite 0 0 0 2 -  Feeling bad or failure about yourself  0 0 0 0 -  Trouble concentrating 0 0 1 0 -  Moving slowly or fidgety/restless 0 0 0 0 -  Suicidal thoughts 0 0 0 0 -  PHQ-9 Score 0 0 1 4 -  Difficult doing work/chores - - Not difficult at all Not difficult at all -    No flowsheet data found.   Impression and Recommendations:    1. Hypertension, unspecified type   2. Adjustment disorder with depressed mood in remission   3. Environmental and seasonal allergies   4. Vitamin D deficiency   5. OSA on CPAP   6. Erectile dysfunction due to diseases classified elsewhere   7. Numbness and tingling of both feet   8. 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 Obesity, Class III, BMI 40-49.9 (morbid obesity) (HCC)   10. History of non anemic vitamin B12 deficiency      Hypertension - Stable at this time. - BP currently at goal. - Patient will continue current treatment regimen. - Patient tolerating well without S-E.  - Counseled patient on pathophysiology of disease and discussed various treatment options, which always includes dietary and lifestyle modification as first line.   - Lifestyle changes such as dash and heart healthy diets and engaging in a regular exercise program discussed extensively with patient.   - Ambulatory blood pressure monitoring encouraged at least 3 times weekly.   Keep log and bring in every office visit.  Reminded patient that if he ever feels poorly in any way, to check his blood pressure and pulse.  - We will continue to monitor.   Adjustment disorder with depressed mood in remission  - Stable at this time. - Per patient, taking one bupropion per day.150mg  per day - Continue management as established. - Patient tolerating well without S-E.  - In addition to prescription intervention, reviewed the "spokes of the wheel" of mood and health management.  Stressed the importance of ongoing prudent habits, including regular exercise, appropriate sleep hygiene, healthful dietary habits, and prayer/meditation to relax.  - Will continue to monitor.  Environmental and seasonal allergies  - Stable at this time on current management. - Patient will continue Singulair and Zyrtec daily. - Patient tolerating treatment plan well without side-effects.  - Will continue to monitor.   Erectile Dysfunction - Risks, benefits, and alternatives to medication discussed with patient today. - Told patient to avoid purchasing medications over the Internet.  - Discussed Viagra vs Cialis in terms of cost, risks, and benefits. - Patient used 100 of Viagra in the past. - Cialis 20 mg prescribed today.  Patient knows to use one half tablet if effective, increasing dose to one full tablet if needed. -Red flag symptoms regarding side effects discussed with patient.  Consents to taking medicine despite potential risks - Discussed that physical inactivity and being overweight can contribute to decreased libido and erectile dysfunction, by contributing to poor circulation and decreased blood flow.  - Will re-evaluate patient's testosterone levels in the future as-needed.  - Will continue to monitor.   LE Numbness - Numbness/Tingling Bilateral Feet - Dicussed patient's symptoms at length during appointment today.  - Encouraged patient to wear well-fitting shoes at all  times.  Not too tight as they have been. - To help reduce LE swelling, discussed importance of elevating legs while seated.  - Benefits of exercise extensively reviewed.  Encouraged patient to engage in regular physical activity, especially a formal exercise routine.  Discussed that 150-300 minutes of cardiovascular activity will help to improve the patient's vascular health.  - Told patient to commit to 20 minutes of walking, 10 minutes in the morning, 10 minutes in the evening, for 30 days.  - If symptoms do not significantly improve over 30 days, will consider referral to vascular specialist or neurology for further evaluation, which he states is not so bad that he desires referral at this time.  - Will continue to monitor.   Vitamin D Deficiency - Stable on supplementation as established - Patient asymptomatic at this time.  - Need for re-check. - Continue supplementation as established.  - Reviewed benefits of well-managed Vitamin D.  - Will continue to monitor and re-check as discussed.   Morbidly Obese - Body mass index is 45.04 kg/m - Weight loss encouraged to patient today. - Encouraged patient to increase his daily exercise. - Prudent habits reviewed and discussed.  - Reviewed option of referral to [facility] with patient. - Ambulatory referral to [facility] placed today.  - Will continue to monitor.   OSA on CPAP - Stable at this time. - Continue management as established. - Patient knows to obtain follow up and interrogation as advised.  - Will continue to monitor.   Recommendations - Last lab work drawn in 2019. - Return for CPE and full fasting blood work, testosterone, B12, near future.   - As part of my medical decision making, I reviewed the following data within the electronic MEDICAL RECORD NUMBER History obtained from pt /family, CMA notes reviewed and incorporated if applicable, Labs reviewed, Radiograph/ tests reviewed  if applicable and OV notes from prior OV's with me, as well as other specialists she/he has seen since seeing me last, were all reviewed and used in my medical decision making process today.   - Additionally, discussion had with patient regarding txmnt plan, their biases about that plan etc were used in my medical decision making today.   - The patient agreed with the plan and demonstrated an understanding of the instructions.   No barriers to understanding were  identified.    - Red flag symptoms and signs discussed in detail.  Patient expressed understanding regarding what to do in case of emergency\ urgent symptoms.  The patient was advised to call back or seek an in-person evaluation if the symptoms worsen or if the condition fails to improve as anticipated.   Return for CPE in 4-6 weeks with full fasting blood work plus testosterone, B12 at that time.    Orders Placed This Encounter  Procedures  . Amb Ref to Medical Weight Management    Meds ordered this encounter  Medications  . hydrochlorothiazide (MICROZIDE) 12.5 MG capsule    Sig: Take 1 capsule (12.5 mg total) by mouth daily.    Dispense:  90 capsule    Refill:  1  . losartan (COZAAR) 100 MG tablet    Sig: Take 1 tablet (100 mg total) by mouth daily.    Dispense:  90 tablet    Refill:  1  . tadalafil (CIALIS) 20 MG tablet    Sig: Take 0.5-1 tablets (10-20 mg total) by mouth daily as needed for erectile dysfunction. 1 hour prior    Dispense:  6 tablet    Refill:  1  . buPROPion (WELLBUTRIN XL) 150 MG 24 hr tablet    Sig: Take 1 tablet (150 mg total) by mouth daily.    Dispense:  30 tablet    Refill:  0    Medications Discontinued During This Encounter  Medication Reason  . losartan-hydrochlorothiazide (HYZAAR) 100-12.5 MG tablet Error  . triamcinolone ointment (KENALOG) 0.5 % Error  . predniSONE (STERAPRED UNI-PAK 21 TAB) 10 MG (21) TBPK tablet Error  . losartan (COZAAR) 100 MG tablet Reorder  . hydrochlorothiazide  (MICROZIDE) 12.5 MG capsule Reorder  . buPROPion (WELLBUTRIN XL) 150 MG 24 hr tablet       Note:  This note was prepared with assistance of Dragon voice recognition software. Occasional wrong-word or sound-a-like substitutions may have occurred due to the inherent limitations of voice recognition software.   This document serves as a record of services personally performed by Mellody Dance, DO. It was created on her behalf by Toni Amend, a trained medical scribe. The creation of this record is based on the scribe's personal observations and the provider's statements to them.   This case required medical decision making of at least moderate complexity. The above documentation has been reviewed to be accurate and was completed by Marjory Sneddon, D.O.    Patient Care Team    Relationship Specialty Notifications Start End  Mellody Dance, DO PCP - General Family Medicine  04/22/17     -Vitals obtained; medications/ allergies reconciled;  personal medical, social, Sx etc.histories were updated by CMA, reviewed by me and are reflected in chart  Patient Active Problem List   Diagnosis Date Noted  . Hypertension 04/27/2017  . Adjustment disorder with depressed mood in remission 04/27/2017  . Environmental and seasonal allergies 04/27/2017  . Erectile dysfunction due to diseases classified elsewhere 09/26/2019  . Testosterone insufficiency 09/26/2019  . History of non anemic vitamin B12 deficiency 06/07/2018  . Depression 03/02/2018  . GERD (gastroesophageal reflux disease) 04/27/2017  . Vitamin D deficiency 04/27/2017  . Obesity, Class III, BMI 40-49.9 (morbid obesity) ([place]) 04/27/2017  . Numbness and tingling of both feet 04/27/2017  . OSA on CPAP 04/27/2017  . Family history of suicide- father age 61 04/27/2017  . Family history of alcoholism in grandfathers 04/27/2017  . Family history of drug addiction 04/27/2017  .  Family history of depression 04/27/2017  . Family  history of diet-controlled diabetes- father in 63s 04/27/2017  . Family history of non-Hodgkin's lymphoma- father 04/27/2017  . Erectile dysfunction 04/19/2015     No outpatient medications have been marked as taking for the 09/26/19 encounter (Office Visit) with Joseph Lot, DO.     Allergies  Allergen Reactions  . Amoxicillin-Pot Clavulanate Rash  . Penicillins Hives and Other (See Comments)    Has patient had a PCN reaction causing immediate rash, facial/tongue/throat swelling, SOB or lightheadedness with hypotension:  Has patient had a PCN reaction causing severe rash involving mucus membranes or skin necrosis:  Has patient had a PCN reaction that required hospitalization:  Has patient had a PCN reaction occurring within the last 10 years:  If all of the above answers are "NO", then may proceed with Cephalosporin use.      ROS:  See above HPI for pertinent positives and negatives   Objective:   Blood pressure 133/78, height 6\' 4"  (1.93 m), weight (!) 370 lb (167.8 kg).  (if some vitals are omitted, this means that patient was UNABLE to obtain them even though they were asked to get them prior to OV today.  They were asked to call at their earliest convenience with these once obtained.)  General: A & O * 3; visually in no acute distress; in usual state of health.  Skin: Visible skin appears normal and pt's usual skin color HEENT:  EOMI, head is normocephalic and atraumatic.  Sclera are anicteric. Neck has a good range of motion.  Lips are noncyanotic Chest: normal chest excursion and movement Respiratory: speaking in full sentences, no conversational dyspnea; no use of accessory muscles Psych: insight good, mood- appears full

## 2019-09-26 NOTE — Patient Instructions (Signed)
Movement goal: 10 minutes of walking once or twice per day, at a pace that causes you to be mildly out of breath.

## 2019-09-29 MED FILL — HYDROCHLOROTHIAZIDE 12.5 MG: 12.5 | 90 days supply | Qty: 90 | Fill #0

## 2019-10-03 ENCOUNTER — Other Ambulatory Visit: Payer: Self-pay | Admitting: Family Medicine

## 2019-10-03 DIAGNOSIS — J3089 Other allergic rhinitis: Secondary | ICD-10-CM

## 2019-10-04 MED FILL — MONTELUKAST SOD 10 MG TAB: 10 | 90 days supply | Qty: 90 | Fill #0

## 2019-10-09 MED ORDER — BUPROPION HCL ER (XL) 150 MG PO TB24: 150.0000 mg | ORAL_TABLET | Freq: Every day | ORAL | 0 refills | Status: DC

## 2019-10-19 ENCOUNTER — Encounter (INDEPENDENT_AMBULATORY_CARE_PROVIDER_SITE_OTHER): Payer: Self-pay | Admitting: Bariatrics

## 2019-10-19 ENCOUNTER — Ambulatory Visit (INDEPENDENT_AMBULATORY_CARE_PROVIDER_SITE_OTHER): Payer: 59 | Admitting: Bariatrics

## 2019-10-19 ENCOUNTER — Other Ambulatory Visit: Payer: Self-pay

## 2019-10-19 VITALS — BP 124/74 | HR 61 | Temp 98.5°F | Ht 76.0 in | Wt 364.0 lb

## 2019-10-19 DIAGNOSIS — R5383 Other fatigue: Secondary | ICD-10-CM

## 2019-10-19 DIAGNOSIS — Z0289 Encounter for other administrative examinations: Secondary | ICD-10-CM

## 2019-10-19 DIAGNOSIS — E559 Vitamin D deficiency, unspecified: Secondary | ICD-10-CM

## 2019-10-19 DIAGNOSIS — E538 Deficiency of other specified B group vitamins: Secondary | ICD-10-CM

## 2019-10-19 DIAGNOSIS — Z9189 Other specified personal risk factors, not elsewhere classified: Secondary | ICD-10-CM

## 2019-10-19 DIAGNOSIS — I10 Essential (primary) hypertension: Secondary | ICD-10-CM

## 2019-10-19 DIAGNOSIS — Z6841 Body Mass Index (BMI) 40.0 and over, adult: Secondary | ICD-10-CM

## 2019-10-19 DIAGNOSIS — Z1331 Encounter for screening for depression: Secondary | ICD-10-CM | POA: Diagnosis not present

## 2019-10-19 DIAGNOSIS — R0602 Shortness of breath: Secondary | ICD-10-CM

## 2019-10-19 DIAGNOSIS — G4733 Obstructive sleep apnea (adult) (pediatric): Secondary | ICD-10-CM | POA: Diagnosis not present

## 2019-10-19 DIAGNOSIS — Z9989 Dependence on other enabling machines and devices: Secondary | ICD-10-CM

## 2019-10-19 DIAGNOSIS — E349 Endocrine disorder, unspecified: Secondary | ICD-10-CM

## 2019-10-19 NOTE — Progress Notes (Signed)
Dear Joseph Graves,   Thank you for referring Joseph Graves to our clinic. The following note includes my evaluation and treatment recommendations.  Chief Complaint:   OBESITY Joseph Graves (MR# [id number]) is a 49 y.o. male who presents for evaluation and treatment of obesity and related comorbidities. Current BMI is Body mass index is 44.31 kg/m.Marland Kitchen Joseph Graves has been struggling with his weight for many years and has been unsuccessful in either losing weight, maintaining weight loss, or reaching his healthy weight goal.  Joseph Graves is currently in the action stage of change and ready to dedicate time achieving and maintaining a healthier weight. Joseph Graves is interested in becoming our patient and working on intensive lifestyle modifications including (but not limited to) diet and exercise for weight loss.  Joseph Graves is lactose intolerant. He states that he likes to cook, but notes time as an obstacle. He craves chips and sweets. He does snack at night.  Joseph Graves's habits were reviewed today and are as follows: His family eats meals together, his desired weight loss is 89 lbs, he has been heavy most of his life, he started gaining weight in his mid-30's, his heaviest weight ever was 388 pounds, he craves chips and sweets, he snacks frequently in the evenings, he skips breakfast and sometimes lunch 3 times per week, he is frequently drinking liquids with calories, he frequently makes poor food choices, he frequently eats larger portions than normal and he struggles with emotional eating.  Depression Screen Joseph Graves's Food and Mood (modified PHQ-9) score was 11.  Depression screen PHQ 2/9 10/19/2019  Decreased Interest 1  Down, Depressed, Hopeless 1  PHQ - 2 Score 2  Altered sleeping 3  Tired, decreased energy 1  Change in appetite 2  Feeling bad or failure about yourself  1  Trouble concentrating 2  Moving slowly or fidgety/restless 0  Suicidal thoughts 0  PHQ-9 Score 11  Difficult doing  work/chores Not difficult at all   Subjective:   Other fatigue. Joseph Graves denies daytime somnolence and denies waking up still tired. Joseph Graves generally gets 8 hours of sleep per night, and states that he has generally restful sleep. Snoring is not present with CPAP. Apneic episodes are not present with CPAP. Epworth Sleepiness Score is 5.  Shortness of breath on exertion. Joseph Graves notes increasing shortness of breath with certain activities and seems to be worsening over time with weight gain. He notes getting out of breath sooner with activity than he used to. This has not gotten worse recently. Joseph Graves denies shortness of breath at rest or orthopnea.  Essential hypertension. Joseph Graves is taking losartan and HCTZ. Blood pressure is well controlled.  BP Readings from Last 3 Encounters:  10/19/19 124/74  09/26/19 133/78  12/28/18 133/82   Lab Results  Component Value Date   CREATININE 1.06 06/07/2018   CREATININE 0.97 04/27/2017   CREATININE 1.1 10/24/2016   OSA, on CPAP. Joseph Graves has a diagnosis of sleep apnea. He reports that he is using a CPAP regularly and reports restful sleep.  Vitamin D deficiency. Joseph Graves is taking Vitamin D3 OTC 5,000 IU.  B12 nutritional deficiency. Joseph Graves is taking Vitamin B12 1,000 mcg.   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   At risk for heart disease. Joseph Graves is at a higher than average risk for cardiovascular disease due to obesity and hypertension. Reviewed: no chest pain on exertion, no dyspnea on exertion, and no swelling  of ankles.  Depression screening. Joseph Graves had a moderately positive depression screening with a PHQ-9 score of 11 today.  Assessment/Plan:   Other fatigue. Joseph Graves does feel that his weight is causing his energy to be lower than it should be. Fatigue may be related to obesity, depression or many other causes. Labs will be ordered, and in the meanwhile,  Joseph Graves will focus on self care including making healthy food choices, increasing physical activity and focusing on stress reduction. EKG 12-Lead, T3, T4, free, TSH, Vitamin B12, Hemoglobin A1c, Insulin, random, Comprehensive metabolic panel, Testosterone, Free and Total, labs ordered.  Shortness of breath on exertion. Joseph Graves does not feel that he gets out of breath more easily that he used to when he exercises. Joseph Graves's shortness of breath appears to be obesity related and exercise induced. He has agreed to work on weight loss and gradually increase exercise to treat his exercise induced shortness of breath. Will continue to monitor closely. Lipid Panel With LDL/HDL Ratio levels ordered.  Essential hypertension. Joseph Graves is working on healthy weight loss and exercise to improve blood pressure control. We will watch for signs of hypotension as he continues his lifestyle modifications. He will continue his medications as prescribed.  OSA, on CPAP. Intensive lifestyle modifications are the first line treatment for this issue. We discussed several lifestyle modifications today and he will continue to work on diet, exercise and weight loss efforts. We will continue to monitor. Orders and follow up as documented in patient record. Joseph Graves will continue to use his CPAP nightly.  Counseling  Sleep apnea is a condition in which breathing pauses or becomes shallow during sleep. This happens over and over during the night. This disrupts your sleep and keeps your body from getting the rest that it needs, which can cause tiredness and lack of energy (fatigue) during the day.  Sleep apnea treatment: If you were given a device to open your airway while you sleep, USE IT!  Sleep hygiene:   Limit or avoid alcohol, caffeinated beverages, and cigarettes, especially close to bedtime.   Do not eat a large meal or eat spicy foods right before bedtime. This can lead to digestive discomfort that can make it hard for you to  sleep.  Keep a sleep diary to help you and your health care provider figure out what could be causing your insomnia.  . Make your bedroom a dark, comfortable place where it is easy to fall asleep. ? Put up shades or blackout curtains to block light from outside. ? Use a white noise machine to block noise. ? Keep the temperature cool. . Limit screen use before bedtime. This includes: ? Watching TV. ? Using your smartphone, tablet, or computer. . Stick to a routine that includes going to bed and waking up at the same times every day and night. This can help you fall asleep faster. Consider making a quiet activity, such as reading, part of your nighttime routine. . Try to avoid taking naps during the day so that you sleep better at night. . Get out of bed if you are still awake after 15 minutes of trying to sleep. Keep the lights down, but try reading or doing a quiet activity. When you feel sleepy, go back to bed.  Vitamin D deficiency. Low Vitamin D level contributes to fatigue and are associated with obesity, breast, and colon cancer. He agrees to continue to take OTC Vitamin D3 @ 5,000 IU. VITAMIN D 25 Hydroxy (Vit-D Deficiency, Fractures) D level ordered.  B12 nutritional deficiency. The diagnosis was reviewed with the patient. Counseling provided today, see below. We will continue to monitor. Orders and follow up as documented in patient record. B12 level ordered.  Counseling . The body needs vitamin B12: to make red blood cells; to make DNA; and to help the nerves work properly so they can carry messages from the brain to the body.  . The main causes of vitamin B12 deficiency include dietary deficiency, digestive diseases, pernicious anemia, and having a surgery in which part of the stomach or small intestine is removed.  . Certain medicines can make it harder for the body to absorb vitamin B12. These medicines include: heartburn medications; some antibiotics; some medications used to treat  diabetes, gout, and high cholesterol.  . In some cases, there are no symptoms of this condition. If the condition leads to anemia or nerve damage, various symptoms can occur, such as weakness or fatigue, shortness of breath, and numbness or tingling in your hands and feet.   . Treatment:  o May include taking vitamin B12 supplements.  o Avoid alcohol.  o Eat lots of healthy foods that contain vitamin B12: - Beef, pork, chicken, [place], and organ meats, such as liver.  - Seafood: This includes clams, rainbow trout, salmon, tuna, and haddock. Eggs.  - Cereal and dairy products that are fortified: This means that vitamin B12 has been added to the food.   Testosterone insufficiency. Will have testosterone level checked.  At risk for heart disease. Joseph Graves was given approximately 15 minutes of coronary artery disease prevention counseling today. He is 49 y.o. male and has risk factors for heart disease including obesity. We discussed intensive lifestyle modifications today with an emphasis on specific weight loss instructions and strategies.   Repetitive spaced learning was employed today to elicit superior memory formation and behavioral change.  Depression screening. Joseph Graves had a positive depression screening. Depression is commonly associated with obesity and often results in emotional eating behaviors. We will monitor this closely and work on CBT to help improve the non-hunger eating patterns. Referral to Psychology may be required if no improvement is seen as he continues in our clinic.  Class 3 severe obesity with serious comorbidity and body mass index (BMI) of 45.0 to 49.9 in adult, unspecified obesity type (HCC).  Joseph Graves is currently in the action stage of change and his goal is to continue with weight loss efforts. I recommend Joseph Graves begin the structured treatment plan as follows:  He has agreed to the Category 4 Plan + 200 calories.  We independently reviewed labs with the patient from  06/07/2018 including CMP, lipids, Vitamin D, CBC, A1c, and TSH.  He will work on meal planning and will decrease or eliminate meal skipping.  Exercise goals: All adults should avoid inactivity. Some physical activity is better than none, and adults who participate in any amount of physical activity gain some health benefits.   Behavioral modification strategies: increasing lean protein intake, decreasing simple carbohydrates, increasing vegetables, increasing water intake, decreasing eating out, no skipping meals, meal planning and cooking strategies, keeping healthy foods in the home and planning for success.  He was informed of the importance of frequent follow-up visits to maximize his success with intensive lifestyle modifications for his multiple health conditions. He was informed we would discuss his lab results at his next visit unless there is a critical issue that needs to be addressed sooner. Joseph Graves agreed to keep his next visit at the agreed upon time to  discuss these results.  Objective:   Blood pressure 124/74, pulse 61, temperature 98.5 F (36.9 C), height 6\' 4"  (1.93 m), weight (!) 364 lb (165.1 kg), SpO2 98 %. Body mass index is 44.31 kg/m.  EKG: Sinus  Rhythm with a rate of 62 BPM. Low voltage in precordial leads most likely related to body habitus. Otherwise normal.  Indirect Calorimeter completed today shows a VO2 of 460 and a REE of 3201.  His calculated basal metabolic rate is thus his basal metabolic rate is better than expected.  General: Cooperative, alert, well developed, in no acute distress. HEENT: Conjunctivae and lids unremarkable. Cardiovascular: Regular rhythm.  Lungs: Normal work of breathing. Neurologic: No focal deficits.   Lab Results  Component Value Date   CREATININE 1.06 06/07/2018   BUN 16 06/07/2018   NA 143 06/07/2018   K 4.1 06/07/2018   CL 103 06/07/2018   CO2 20 06/07/2018   Lab Results  Component Value Date   ALT 28 06/07/2018    AST 21 06/07/2018   ALKPHOS 79 06/07/2018   BILITOT 0.6 06/07/2018   Lab Results  Component Value Date   HGBA1C 5.5 06/07/2018   HGBA1C 5.5 04/27/2017   No results found for: INSULIN Lab Results  Component Value Date   TSH 2.420 06/07/2018   Lab Results  Component Value Date   CHOL 138 06/07/2018   HDL 38 (L) 06/07/2018   LDLCALC 81 06/07/2018   TRIG 97 06/07/2018   CHOLHDL 3.6 06/07/2018   Lab Results  Component Value Date   WBC 6.1 06/07/2018   HGB 14.2 06/07/2018   HCT 42.4 06/07/2018   MCV 90 06/07/2018   PLT 311 06/07/2018   No results found for: IRON, TIBC, FERRITIN  Attestation Statements:   Reviewed by clinician on day of visit: allergies, medications, problem list, medical history, surgical history, family history, social history, and previous encounter notes.  06/09/2018, am acting as Fernanda Drum for Energy manager, DO   I have reviewed the above documentation for accuracy and completeness, and I agree with the above. [employer], DO

## 2019-10-20 ENCOUNTER — Encounter (INDEPENDENT_AMBULATORY_CARE_PROVIDER_SITE_OTHER): Payer: Self-pay | Admitting: Bariatrics

## 2019-10-20 DIAGNOSIS — R7401 Elevation of levels of liver transaminase levels: Secondary | ICD-10-CM | POA: Insufficient documentation

## 2019-10-20 DIAGNOSIS — R7303 Prediabetes: Secondary | ICD-10-CM | POA: Insufficient documentation

## 2019-10-20 DIAGNOSIS — E786 Lipoprotein deficiency: Secondary | ICD-10-CM | POA: Insufficient documentation

## 2019-10-21 LAB — COMPREHENSIVE METABOLIC PANEL
ALT: 47 IU/L — ABNORMAL HIGH (ref 0–44)
AST: 39 IU/L (ref 0–40)
Albumin/Globulin Ratio: 1.8 (ref 1.2–2.2)
Albumin: 4.5 g/dL (ref 4.0–5.0)
Alkaline Phosphatase: 64 IU/L (ref 39–117)
BUN/Creatinine Ratio: 16 (ref 9–20)
BUN: 15 mg/dL (ref 6–24)
Bilirubin Total: 0.7 mg/dL (ref 0.0–1.2)
CO2: 22 mmol/L (ref 20–29)
Calcium: 9.6 mg/dL (ref 8.7–10.2)
Chloride: 103 mmol/L (ref 96–106)
Creatinine, Ser: 0.94 mg/dL (ref 0.76–1.27)
GFR calc Af Amer: 109 mL/min/{1.73_m2} (ref >59)
GFR calc non Af Amer: 94 mL/min/{1.73_m2} (ref >59)
Globulin, Total: 2.5 g/dL (ref 1.5–4.5)
Glucose: 90 mg/dL (ref 65–99)
Potassium: 4.4 mmol/L (ref 3.5–5.2)
Sodium: 139 mmol/L (ref 134–144)
Total Protein: 7 g/dL (ref 6.0–8.5)

## 2019-10-21 LAB — TSH: TSH: 2.55 u[IU]/mL (ref 0.450–4.500)

## 2019-10-21 LAB — LIPID PANEL WITH LDL/HDL RATIO
Cholesterol, Total: 138 mg/dL (ref 100–199)
HDL: 38 mg/dL — ABNORMAL LOW (ref >39)
LDL Chol Calc (NIH): 79 mg/dL (ref 0–99)
LDL/HDL Ratio: 2.1 ratio (ref 0.0–3.6)
Triglycerides: 117 mg/dL (ref 0–149)
VLDL Cholesterol Cal: 21 mg/dL (ref 5–40)

## 2019-10-21 LAB — VITAMIN B12: Vitamin B-12: >2000 pg/mL — ABNORMAL HIGH (ref 232–1245)

## 2019-10-21 LAB — T3: T3, Total: 126 ng/dL (ref 71–180)

## 2019-10-21 LAB — TESTOSTERONE,FREE AND TOTAL
Testosterone, Free: 6.1 pg/mL — ABNORMAL LOW (ref 7.2–24.0)
Testosterone: 259 ng/dL — ABNORMAL LOW (ref 264–916)

## 2019-10-21 LAB — HEMOGLOBIN A1C
Est. average glucose Bld gHb Est-mCnc: 120 mg/dL
Hgb A1c MFr Bld: 5.8 % — ABNORMAL HIGH (ref 4.8–5.6)

## 2019-10-21 LAB — T4, FREE: Free T4: 1.14 ng/dL (ref 0.82–1.77)

## 2019-10-21 LAB — INSULIN, RANDOM: INSULIN: 41 u[IU]/mL — ABNORMAL HIGH (ref 2.6–24.9)

## 2019-10-21 LAB — VITAMIN D 25 HYDROXY (VIT D DEFICIENCY, FRACTURES): Vit D, 25-Hydroxy: 40.9 ng/mL (ref 30.0–100.0)

## 2019-11-02 ENCOUNTER — Other Ambulatory Visit: Payer: Self-pay

## 2019-11-02 ENCOUNTER — Ambulatory Visit (INDEPENDENT_AMBULATORY_CARE_PROVIDER_SITE_OTHER): Payer: 59 | Admitting: Bariatrics

## 2019-11-02 ENCOUNTER — Encounter (INDEPENDENT_AMBULATORY_CARE_PROVIDER_SITE_OTHER): Payer: Self-pay | Admitting: Bariatrics

## 2019-11-02 VITALS — BP 122/74 | HR 59 | Temp 98.3°F | Ht 76.0 in | Wt 355.0 lb

## 2019-11-02 DIAGNOSIS — E559 Vitamin D deficiency, unspecified: Secondary | ICD-10-CM

## 2019-11-02 DIAGNOSIS — Z6834 Body mass index (BMI) 34.0-34.9, adult: Secondary | ICD-10-CM | POA: Diagnosis not present

## 2019-11-02 DIAGNOSIS — E7849 Other hyperlipidemia: Secondary | ICD-10-CM | POA: Diagnosis not present

## 2019-11-02 DIAGNOSIS — R7303 Prediabetes: Secondary | ICD-10-CM | POA: Diagnosis not present

## 2019-11-02 DIAGNOSIS — R7401 Elevation of levels of liver transaminase levels: Secondary | ICD-10-CM

## 2019-11-02 DIAGNOSIS — R7989 Other specified abnormal findings of blood chemistry: Secondary | ICD-10-CM | POA: Diagnosis not present

## 2019-11-02 DIAGNOSIS — E669 Obesity, unspecified: Secondary | ICD-10-CM

## 2019-11-02 NOTE — Progress Notes (Signed)
Chief Complaint:   OBESITY Joseph Graves is here to discuss his progress with his obesity treatment plan along with follow-up of his obesity related diagnoses. Joseph Graves is on the Category 4 Plan + 200 calories and states he is following his eating plan approximately 80% of the time. Joseph Graves states he is walking 3 miles 1-2 times per week.  Today's visit was #: 2 Starting weight: 364 lbs Starting date: 10/19/2019 Today's weight: 355 lbs Today's date: 11/02/2019 Total lbs lost to date: 9 Total lbs lost since last in-office visit: 9  Interim History: Joseph Graves is down 9 lbs and only had any extra food during the [employer]. He states it is not that bad.  Subjective:   Vitamin D deficiency. Joseph Graves is taking Vitamin D 5,000 units. Last Vitamin D level 40.9 on 10/19/2019.   Other hyperlipidemia. Joseph Graves has low HDL, otherwise lipids are normal.   Lab Results  Component Value Date   CHOL 138 10/19/2019   HDL 38 (L) 10/19/2019   LDLCALC 79 10/19/2019   TRIG 117 10/19/2019   CHOLHDL 3.6 06/07/2018   Lab Results  Component Value Date   ALT 47 (H) 10/19/2019   AST 39 10/19/2019   ALKPHOS 64 10/19/2019   BILITOT 0.7 10/19/2019   The 10-year ASCVD risk score Joseph Graves DC Jr., et al., 2013) is: 2.9%   Values used to calculate the score:     Age: 49 years     Sex: Male     Is Non-Hispanic African American: No     Diabetic: No     Tobacco smoker: No     Systolic Blood Pressure: 237 mmHg     Is BP treated: Yes     HDL Cholesterol: 38 mg/dL     Total Cholesterol: 138 mg/dL  Elevated ALT measurement. Joseph Graves has a new dx of elevated ALT. His BMI is over 40. He denies abdominal pain or jaundice and has never been told of any liver problems in the past. He denies excessive alcohol intake. He has had no testing (ultrasound). He reports no Tylenol use and minimal alcohol.  Lab Results  Component Value Date   ALT 47 (H) 10/19/2019   AST 39 10/19/2019   ALKPHOS 64 10/19/2019   BILITOT 0.7  10/19/2019   Prediabetes. Joseph Graves has a diagnosis of prediabetes based on his elevated HgA1c and was informed this puts him at greater risk of developing diabetes. He continues to work on diet and exercise to decrease his risk of diabetes. He denies nausea or hypoglycemia. No polyphagia.  Lab Results  Component Value Date   HGBA1C 5.8 (H) 10/19/2019   Lab Results  Component Value Date   INSULIN 41.0 (H) 10/19/2019   Low testosterone. Joseph Graves has a testosterone level of 259 with a free testosterone level of 6.1. He reports fatigue.  Assessment/Plan:   Vitamin D deficiency. Low Vitamin D level contributes to fatigue and are associated with obesity, breast, and colon cancer. He will increase Vitamin D at home and recheck labs.  Other hyperlipidemia. Cardiovascular risk and specific lipid/LDL goals reviewed.  We discussed several lifestyle modifications today and Joseph Graves will continue to work on diet, increase exercise and weight loss efforts. Orders and follow up as documented in patient record. He will increase healthy fats, MUFA's and PUFA's.  Counseling Intensive lifestyle modifications are the first line treatment for this issue. . Dietary changes: Increase soluble fiber. Decrease simple carbohydrates. . Exercise changes: Moderate to vigorous-intensity aerobic activity 150  minutes per week if tolerated. . Lipid-lowering medications: see documented in medical record.  Elevated ALT measurement. We discussed the likely diagnosis of non-alcoholic fatty liver disease today and how this condition is obesity related. Joseph Graves was educated the importance of weight loss. Joseph Graves agreed to continue with his weight loss efforts with healthier diet and exercise as an essential part of his treatment plan. Will follow over time.  Prediabetes. Joseph Graves will continue to work on weight loss, exercise, increasing protein and healthy fats, and decreasing simple carbohydrates to help decrease the risk of diabetes.     Low testosterone. Joseph Graves will check in with his PCP to recommend if he needs exogenous testosterone.  Class 1 obesity with serious comorbidity and body mass index (BMI) of 34.0 to 34.9 in adult, unspecified obesity type.  Joseph Graves is currently in the action stage of change. As such, his goal is to continue with weight loss efforts. He has agreed to the Category 4 Plan + 200 calories.   We independently reviewed labs with the patient including CMP, lipids, Vitamin D, B12, A1c, insulin, testosterone, and TSH.   Exercise goals: All adults should avoid inactivity. Some physical activity is better than none, and adults who participate in any amount of physical activity gain some health benefits.  Behavioral modification strategies: increasing lean protein intake, decreasing simple carbohydrates, increasing vegetables, increasing water intake, decreasing eating out, no skipping meals, meal planning and cooking strategies, keeping healthy foods in the home and planning for success.  Joseph Graves has agreed to follow-up with our clinic in 2 weeks. He was informed of the importance of frequent follow-up visits to maximize his success with intensive lifestyle modifications for his multiple health conditions.   Objective:   Blood pressure 122/74, pulse (!) 59, temperature 98.3 F (36.8 C), height 6\' 4"  (1.93 m), weight (!) 355 lb (161 kg), SpO2 97 %. Body mass index is 43.21 kg/m.  General: Cooperative, alert, well developed, in no acute distress. HEENT: Conjunctivae and lids unremarkable. Cardiovascular: Regular rhythm.  Lungs: Normal work of breathing. Neurologic: No focal deficits.   Lab Results  Component Value Date   CREATININE 0.94 10/19/2019   BUN 15 10/19/2019   NA 139 10/19/2019   K 4.4 10/19/2019   CL 103 10/19/2019   CO2 22 10/19/2019   Lab Results  Component Value Date   ALT 47 (H) 10/19/2019   AST 39 10/19/2019   ALKPHOS 64 10/19/2019   BILITOT 0.7 10/19/2019   Lab Results   Component Value Date   HGBA1C 5.8 (H) 10/19/2019   HGBA1C 5.5 06/07/2018   HGBA1C 5.5 04/27/2017   Lab Results  Component Value Date   INSULIN 41.0 (H) 10/19/2019   Lab Results  Component Value Date   TSH 2.550 10/19/2019   Lab Results  Component Value Date   CHOL 138 10/19/2019   HDL 38 (L) 10/19/2019   LDLCALC 79 10/19/2019   TRIG 117 10/19/2019   CHOLHDL 3.6 06/07/2018   Lab Results  Component Value Date   WBC 6.1 06/07/2018   HGB 14.2 06/07/2018   HCT 42.4 06/07/2018   MCV 90 06/07/2018   PLT 311 06/07/2018   No results found for: IRON, TIBC, FERRITIN  Attestation Statements:   Reviewed by clinician on day of visit: allergies, medications, problem list, medical history, surgical history, family history, social history, and previous encounter notes.  Time spent on visit including pre-visit chart review and post-visit care was 30 minutes.   06/09/2018, am acting  as transcriptionist for Jearld Lesch, DO   I have reviewed the above documentation for accuracy and completeness, and I agree with the above. Jearld Lesch, DO

## 2019-11-03 ENCOUNTER — Encounter (INDEPENDENT_AMBULATORY_CARE_PROVIDER_SITE_OTHER): Payer: Self-pay | Admitting: Bariatrics

## 2019-11-03 DIAGNOSIS — G4733 Obstructive sleep apnea (adult) (pediatric): Secondary | ICD-10-CM | POA: Diagnosis not present

## 2019-11-15 ENCOUNTER — Ambulatory Visit (INDEPENDENT_AMBULATORY_CARE_PROVIDER_SITE_OTHER): Payer: 59 | Admitting: Family Medicine

## 2019-11-15 ENCOUNTER — Other Ambulatory Visit: Payer: Self-pay

## 2019-11-15 ENCOUNTER — Encounter (INDEPENDENT_AMBULATORY_CARE_PROVIDER_SITE_OTHER): Payer: Self-pay | Admitting: Family Medicine

## 2019-11-15 VITALS — BP 123/73 | HR 60 | Temp 97.9°F | Ht 76.0 in | Wt 341.0 lb

## 2019-11-15 DIAGNOSIS — R7303 Prediabetes: Secondary | ICD-10-CM

## 2019-11-15 DIAGNOSIS — E559 Vitamin D deficiency, unspecified: Secondary | ICD-10-CM

## 2019-11-15 DIAGNOSIS — Z9189 Other specified personal risk factors, not elsewhere classified: Secondary | ICD-10-CM | POA: Diagnosis not present

## 2019-11-15 DIAGNOSIS — Z6841 Body Mass Index (BMI) 40.0 and over, adult: Secondary | ICD-10-CM | POA: Diagnosis not present

## 2019-11-15 DIAGNOSIS — R7401 Elevation of levels of liver transaminase levels: Secondary | ICD-10-CM | POA: Diagnosis not present

## 2019-11-15 DIAGNOSIS — E786 Lipoprotein deficiency: Secondary | ICD-10-CM | POA: Diagnosis not present

## 2019-11-15 DIAGNOSIS — R7989 Other specified abnormal findings of blood chemistry: Secondary | ICD-10-CM

## 2019-11-15 NOTE — Progress Notes (Signed)
Chief Complaint:   OBESITY Joseph Graves is here to discuss his progress with his obesity treatment plan along with follow-up of his obesity related diagnoses. Joseph Graves is on the Category 4 Plan and states he is following his eating plan approximately 95% of the time. Joseph Graves states he is walking for 20-30 minutes 3-4 times per week.  Today's visit was #: 3 Starting weight: 364 lbs Starting date: 10/19/2019 Today's weight: 341 lbs Today's date: 11/15/2019 Total lbs lost to date: 23 lbs Total lbs lost since last in-office visit: 14 lbs  Interim History: Joseph Graves is following the plan well.  He has increased his water consumption.  He is eating 1800-2000 calories per day.  He says he has mild nighttime cravings, but making good choices.  Subjective:   1. Vitamin D deficiency Joseph Graves's Vitamin D level was 40-9 on 10/19/2019. He is currently taking vit D. He denies nausea, vomiting or muscle weakness.  2. Low HDL Joseph Graves had an HDL below 40 at his last blood draw.  Lab Results  Component Value Date   CHOL 138 10/19/2019   HDL 38 (L) 10/19/2019   LDLCALC 79 10/19/2019   TRIG 117 10/19/2019   CHOLHDL 3.6 06/07/2018   3. Elevated ALT measurement Joseph Graves had an elevated ALT level at his last blood draw.  Lab Results  Component Value Date   ALT 47 (H) 10/19/2019   4. Prediabetes Joseph Graves will continue to work on weight loss, exercise, and decreasing simple carbohydrates to help decrease the risk of diabetes.   5. Decreased testosterone level Lab Results  Component Value Date   TESTOSTERONE 259 (L) 10/19/2019   6. At risk for diabetes mellitus Joseph Graves is at higher than average risk for developing diabetes due to his obesity.   Assessment/Plan:   1. Vitamin D deficiency Low Vitamin D level contributes to fatigue and are associated with obesity, breast, and colon cancer. He agrees to continue to take prescription Vitamin D @50 ,000 IU every week and will follow-up for routine testing of  Vitamin D, at least 2-3 times per year to avoid over-replacement.  2. Low HDL We will continue to monitor. Orders and follow up as documented in patient record.  3. Elevated ALT measurement Orders and follow up as documented in patient record.  4. Prediabetes Joseph Graves will continue to work on weight loss, exercise, and decreasing simple carbohydrates to help decrease the risk of diabetes.   5. Decreased testosterone level Will monitor.  6. At risk for diabetes mellitus Joseph Graves was given approximately 15 minutes of diabetes education and counseling today. We discussed intensive lifestyle modifications today with an emphasis on weight loss as well as increasing exercise and decreasing simple carbohydrates in his diet. We also reviewed medication options with an emphasis on risk versus benefit of those discussed.   Repetitive spaced learning was employed today to elicit superior memory formation and behavioral change.  7. Class 3 severe obesity with serious comorbidity and body mass index (BMI) of 40.0 to 44.9 in adult, unspecified obesity type (HCC) Joseph Graves is currently in the action stage of change. As such, his goal is to continue with weight loss efforts. He has agreed to the Category 4 Plan.   Exercise goals: As is.  Behavioral modification strategies: increasing lean protein intake and increasing water intake.  Joseph Graves has agreed to follow-up with our clinic in 2 weeks. He was informed of the importance of frequent follow-up visits to maximize his success with intensive lifestyle modifications for his multiple  health conditions.   Objective:   Blood pressure 123/73, pulse 60, temperature 97.9 F (36.6 C), temperature source Oral, height 6\' 4"  (1.93 m), weight (!) 341 lb (154.7 kg), SpO2 96 %. Body mass index is 41.51 kg/m.  General: Cooperative, alert, well developed, in no acute distress. HEENT: Conjunctivae and lids unremarkable. Cardiovascular: Regular rhythm.  Lungs: Normal  work of breathing. Neurologic: No focal deficits.   Lab Results  Component Value Date   CREATININE 0.94 10/19/2019   BUN 15 10/19/2019   NA 139 10/19/2019   K 4.4 10/19/2019   CL 103 10/19/2019   CO2 22 10/19/2019   Lab Results  Component Value Date   ALT 47 (H) 10/19/2019   AST 39 10/19/2019   ALKPHOS 64 10/19/2019   BILITOT 0.7 10/19/2019   Lab Results  Component Value Date   HGBA1C 5.8 (H) 10/19/2019   HGBA1C 5.5 06/07/2018   HGBA1C 5.5 04/27/2017   Lab Results  Component Value Date   INSULIN 41.0 (H) 10/19/2019   Lab Results  Component Value Date   TSH 2.550 10/19/2019   Lab Results  Component Value Date   CHOL 138 10/19/2019   HDL 38 (L) 10/19/2019   LDLCALC 79 10/19/2019   TRIG 117 10/19/2019   CHOLHDL 3.6 06/07/2018   Lab Results  Component Value Date   WBC 6.1 06/07/2018   HGB 14.2 06/07/2018   HCT 42.4 06/07/2018   MCV 90 06/07/2018   PLT 311 06/07/2018   Attestation Statements:   Reviewed by clinician on day of visit: allergies, medications, problem list, medical history, surgical history, family history, social history, and previous encounter notes.  I, 06/09/2018, CMA, am acting as Insurance claims handler for Energy manager, DO.  I have reviewed the above documentation for accuracy and completeness, and I agree with the above. [employer], DO

## 2019-11-21 DIAGNOSIS — H524 Presbyopia: Secondary | ICD-10-CM | POA: Diagnosis not present

## 2019-11-21 DIAGNOSIS — H52223 Regular astigmatism, bilateral: Secondary | ICD-10-CM | POA: Diagnosis not present

## 2019-11-29 ENCOUNTER — Ambulatory Visit (INDEPENDENT_AMBULATORY_CARE_PROVIDER_SITE_OTHER): Payer: 59 | Admitting: Bariatrics

## 2019-11-29 ENCOUNTER — Encounter (INDEPENDENT_AMBULATORY_CARE_PROVIDER_SITE_OTHER): Payer: Self-pay | Admitting: Bariatrics

## 2019-11-29 ENCOUNTER — Other Ambulatory Visit: Payer: Self-pay

## 2019-11-29 VITALS — BP 118/72 | HR 74 | Temp 97.7°F | Ht 76.0 in | Wt 340.0 lb

## 2019-11-29 DIAGNOSIS — Z9189 Other specified personal risk factors, not elsewhere classified: Secondary | ICD-10-CM

## 2019-11-29 DIAGNOSIS — R7303 Prediabetes: Secondary | ICD-10-CM | POA: Diagnosis not present

## 2019-11-29 DIAGNOSIS — E66813 Obesity, class 3: Secondary | ICD-10-CM

## 2019-11-29 DIAGNOSIS — I10 Essential (primary) hypertension: Secondary | ICD-10-CM

## 2019-11-29 DIAGNOSIS — Z6841 Body Mass Index (BMI) 40.0 and over, adult: Secondary | ICD-10-CM

## 2019-11-29 MED ORDER — METFORMIN HCL 500 MG PO TABS: 500.0000 mg | ORAL_TABLET | Freq: Every day | ORAL | 0 refills | Status: DC

## 2019-11-29 MED FILL — metFORMIN HCL 500 MG TABS: 500 | 30 days supply | Qty: 30 | Fill #0

## 2019-11-29 NOTE — Progress Notes (Signed)
Chief Complaint:   OBESITY Joseph Graves is here to discuss his progress with his obesity treatment plan along with follow-up of his obesity related diagnoses. Joseph Graves is on the Category 4 Plan and states he is following his eating plan approximately 100% of the time. Joseph Graves states he is painting 30 minutes 7 times per week.  Today's visit was #: 4 Starting weight: 364 lbs Starting date: 10/19/2019 Today's weight: 340 lbs Today's date: 11/29/2019 Total lbs lost to date: 24 Total lbs lost since last in-office visit: 1  Interim History: Joseph Graves is down 1 lb, but has done well overall. He is more hungry in the middle of the day 3:30-4:00 p.m. He is not doing as well with his water intake.  Subjective:   Prediabetes. Joseph Graves has a diagnosis of prediabetes based on his elevated HgA1c and was informed this puts him at greater risk of developing diabetes. He continues to work on diet and exercise to decrease his risk of diabetes. He denies nausea or hypoglycemia. Joseph Graves reports polyphagia.   Lab Results  Component Value Date   HGBA1C 5.8 (H) 10/19/2019   Lab Results  Component Value Date   INSULIN 41.0 (H) 10/19/2019   Essential hypertension. Joseph Graves is taking medications. Blood pressure is well controlled.  BP Readings from Last 3 Encounters:  11/29/19 118/72  11/15/19 123/73  11/02/19 122/74   Lab Results  Component Value Date   CREATININE 0.94 10/19/2019   CREATININE 1.06 06/07/2018   CREATININE 0.97 04/27/2017   At risk for diarrhea. Joseph Graves is at higher risk of diarrhea secondary to metformin. He will take metformin with lunch, but do not take if he does not eat lunch.  Assessment/Plan:   Prediabetes. Joseph Graves will continue to work on weight loss, exercise, increasing healthy fats and protein, and decreasing simple carbohydrates to help decrease the risk of diabetes. He was given a prescription for metFORMIN (GLUCOPHAGE) 500 MG tablet 1 daily with lunch #30 with 0  refills.  Essential hypertension. Joseph Graves is working on healthy weight loss and exercise to improve blood pressure control. We will watch for signs of hypotension as he continues his lifestyle modifications. He will continue his medications as directed.  At risk for diarrhea. Joseph Graves was given approximately 15 minutes of diarrhea prevention counseling today. He is 49 y.o. male and has risk factors for diarrhea including medications and changes in diet. We discussed intensive lifestyle modifications today with an emphasis on specific weight loss instructions including dietary strategies.   Repetitive spaced learning was employed today to elicit superior memory formation and behavioral change.  Class 3 severe obesity with serious comorbidity and body mass index (BMI) of 40.0 to 44.9 in adult, unspecified obesity type (Joseph Graves).  Joseph Graves is currently in the action stage of change. As such, his goal is to continue with weight loss efforts. He has agreed to the Category 4 Plan.   He will work on meal planning, mindful eating, and increasing his water intake.  Exercise goals: Joseph Graves will remain active and will increase his walking.  Behavioral modification strategies: increasing lean protein intake, decreasing simple carbohydrates, increasing vegetables, increasing water intake, decreasing eating out, no skipping meals, meal planning and cooking strategies, keeping healthy foods in the home and planning for success.  Joseph Graves has agreed to follow-up with our clinic in 2 weeks. He was informed of the importance of frequent follow-up visits to maximize his success with intensive lifestyle modifications for his multiple health conditions.   Objective:  Blood pressure 118/72, pulse 74, temperature 97.7 F (36.5 C), temperature source Oral, height 6\' 4"  (1.93 m), weight (!) 340 lb (154.2 kg), SpO2 98 %. Body mass index is 41.39 kg/m.  General: Cooperative, alert, well developed, in no acute distress. HEENT:  Conjunctivae and lids unremarkable. Cardiovascular: Regular rhythm.  Lungs: Normal work of breathing. Neurologic: No focal deficits.   Lab Results  Component Value Date   CREATININE 0.94 10/19/2019   BUN 15 10/19/2019   NA 139 10/19/2019   K 4.4 10/19/2019   CL 103 10/19/2019   CO2 22 10/19/2019   Lab Results  Component Value Date   ALT 47 (H) 10/19/2019   AST 39 10/19/2019   ALKPHOS 64 10/19/2019   BILITOT 0.7 10/19/2019   Lab Results  Component Value Date   HGBA1C 5.8 (H) 10/19/2019   HGBA1C 5.5 06/07/2018   HGBA1C 5.5 04/27/2017   Lab Results  Component Value Date   INSULIN 41.0 (H) 10/19/2019   Lab Results  Component Value Date   TSH 2.550 10/19/2019   Lab Results  Component Value Date   CHOL 138 10/19/2019   HDL 38 (L) 10/19/2019   LDLCALC 79 10/19/2019   TRIG 117 10/19/2019   CHOLHDL 3.6 06/07/2018   Lab Results  Component Value Date   WBC 6.1 06/07/2018   HGB 14.2 06/07/2018   HCT 42.4 06/07/2018   MCV 90 06/07/2018   PLT 311 06/07/2018   No results found for: IRON, TIBC, FERRITIN  Attestation Statements:   Reviewed by clinician on day of visit: allergies, medications, problem list, medical history, surgical history, family history, social history, and previous encounter notes.  Time spent on visit including pre-visit chart review and post-visit charting and care was 20 minutes.   06/09/2018, am acting as Fernanda Drum for Energy manager, DO   I have reviewed the above documentation for accuracy and completeness, and I agree with the above. [employer], DO

## 2019-12-13 ENCOUNTER — Ambulatory Visit (INDEPENDENT_AMBULATORY_CARE_PROVIDER_SITE_OTHER): Payer: 59 | Admitting: Family Medicine

## 2019-12-13 ENCOUNTER — Encounter (INDEPENDENT_AMBULATORY_CARE_PROVIDER_SITE_OTHER): Payer: Self-pay | Admitting: Family Medicine

## 2019-12-13 ENCOUNTER — Other Ambulatory Visit: Payer: Self-pay

## 2019-12-13 VITALS — BP 119/73 | HR 56 | Temp 97.5°F | Ht 76.0 in | Wt 331.0 lb

## 2019-12-13 DIAGNOSIS — R7303 Prediabetes: Secondary | ICD-10-CM

## 2019-12-13 DIAGNOSIS — Z6841 Body Mass Index (BMI) 40.0 and over, adult: Secondary | ICD-10-CM | POA: Diagnosis not present

## 2019-12-13 DIAGNOSIS — Z9189 Other specified personal risk factors, not elsewhere classified: Secondary | ICD-10-CM

## 2019-12-13 DIAGNOSIS — I10 Essential (primary) hypertension: Secondary | ICD-10-CM | POA: Diagnosis not present

## 2019-12-13 MED ORDER — METFORMIN HCL 500 MG PO TABS: 500.0000 mg | ORAL_TABLET | Freq: Every day | ORAL | 0 refills | Status: DC

## 2019-12-14 ENCOUNTER — Encounter (INDEPENDENT_AMBULATORY_CARE_PROVIDER_SITE_OTHER): Payer: Self-pay | Admitting: Family Medicine

## 2019-12-14 NOTE — Progress Notes (Signed)
Chief Complaint:   OBESITY Joseph Graves is here to discuss his progress with his obesity treatment plan along with follow-up of his obesity related diagnoses. Joseph Graves is on the Category 4 Plan and states he is following his eating plan approximately 90% of the time. Joseph Graves states he is walking for 20-30 minutes 4-5 times per week.  Today's visit was #: 5 Starting weight: 364 lbs Starting date: 10/19/2019 Today's weight: 331 lbs Today's date: 12/13/2019 Total lbs lost to date: 33 Total lbs lost since last in-office visit: 9  Interim History: Joseph Graves is doing quite well on the plan. He reports that his biggest change is that he has cut out sugar. He is tracking his food. He is trying to drink more water but admits he does not drink enough. He denies excessive hunger or cravings. He has lost 33 lbs total since starting our program almost 8 weeks ago.  Subjective:   1. Pre-diabetes Joseph Graves is on metformin q daily. He denies nausea or vomiting. Last A1c was 5.8.  2. Essential hypertension Joseph Graves's blood pressure is well controlled on hydrochlorothiazide and losartan.  3. At risk for dehydration Joseph Graves is at risk for dehydration due to inadequate water intake and HCTZ.  Assessment/Plan:   1. Pre-diabetes Joseph Graves will continue to work on weight loss, exercise, and decreasing simple carbohydrates to help decrease the risk of diabetes. We will refill metformin for 1 month.  - metFORMIN (GLUCOPHAGE) 500 MG tablet; Take 1 tablet (500 mg total) by mouth daily with lunch.  Dispense: 30 tablet; Refill: 0  2. Essential hypertension Joseph Graves is working on healthy weight loss and exercise to improve blood pressure control. We will watch for signs of hypotension as he continues his lifestyle modifications. Joseph Graves agreed to continue his medications and will follow up.  3. At risk for dehydration Joseph Graves was given approximately 15 minutes dehydration prevention counseling today. Joseph Graves is at risk for  dehydration due to weight loss and current medication(s). He was encouraged to hydrate and monitor fluid status to avoid dehydration as well as weight loss plateaus.   4. Class 3 severe obesity with serious comorbidity and body mass index (BMI) of 40.0 to 44.9 in adult, unspecified obesity type (HCC) Joseph Graves is currently in the action stage of change. As such, his goal is to continue with weight loss efforts. He has agreed to the Category 4 Plan.   Exercise goals: As is.  Behavioral modification strategies: increasing water intake and better snacking choices.  Joseph Graves has agreed to follow-up with our clinic in 3 weeks. He was informed of the importance of frequent follow-up visits to maximize his success with intensive lifestyle modifications for his multiple health conditions.   Objective:   Blood pressure 119/73, pulse (!) 56, temperature (!) 97.5 F (36.4 C), temperature source Oral, height 6\' 4"  (1.93 m), weight (!) 331 lb (150.1 kg), SpO2 98 %. Body mass index is 40.29 kg/m.  General: Cooperative, alert, well developed, in no acute distress. HEENT: Conjunctivae and lids unremarkable. Cardiovascular: Regular rhythm.  Lungs: Normal work of breathing. Neurologic: No focal deficits.   Lab Results  Component Value Date   CREATININE 0.94 10/19/2019   BUN 15 10/19/2019   NA 139 10/19/2019   K 4.4 10/19/2019   CL 103 10/19/2019   CO2 22 10/19/2019   Lab Results  Component Value Date   ALT 47 (H) 10/19/2019   AST 39 10/19/2019   ALKPHOS 64 10/19/2019   BILITOT 0.7 10/19/2019   Lab  Results  Component Value Date   HGBA1C 5.8 (H) 10/19/2019   HGBA1C 5.5 06/07/2018   HGBA1C 5.5 04/27/2017   Lab Results  Component Value Date   INSULIN 41.0 (H) 10/19/2019   Lab Results  Component Value Date   TSH 2.550 10/19/2019   Lab Results  Component Value Date   CHOL 138 10/19/2019   HDL 38 (L) 10/19/2019   LDLCALC 79 10/19/2019   TRIG 117 10/19/2019   CHOLHDL 3.6 06/07/2018    Lab Results  Component Value Date   WBC 6.1 06/07/2018   HGB 14.2 06/07/2018   HCT 42.4 06/07/2018   MCV 90 06/07/2018   PLT 311 06/07/2018   No results found for: IRON, TIBC, FERRITIN  Attestation Statements:   Reviewed by clinician on day of visit: allergies, medications, problem list, medical history, surgical history, family history, social history, and previous encounter notes.   Trude Mcburney, am acting as Energy manager for [place], FNP-C.  I have reviewed the above documentation for accuracy and completeness, and I agree with the above. - Jesse Sans, FNP

## 2019-12-26 MED FILL — LOSARTAN POTASSIUM 100 MG T: 100 | 90 days supply | Qty: 90 | Fill #1

## 2020-01-03 ENCOUNTER — Ambulatory Visit (INDEPENDENT_AMBULATORY_CARE_PROVIDER_SITE_OTHER): Payer: 59 | Admitting: Family Medicine

## 2020-01-03 ENCOUNTER — Other Ambulatory Visit: Payer: Self-pay

## 2020-01-03 ENCOUNTER — Encounter (INDEPENDENT_AMBULATORY_CARE_PROVIDER_SITE_OTHER): Payer: Self-pay | Admitting: Family Medicine

## 2020-01-03 VITALS — BP 112/67 | HR 62 | Temp 97.8°F | Ht 76.0 in | Wt 324.0 lb

## 2020-01-03 DIAGNOSIS — R7303 Prediabetes: Secondary | ICD-10-CM | POA: Diagnosis not present

## 2020-01-03 DIAGNOSIS — Z6839 Body mass index (BMI) 39.0-39.9, adult: Secondary | ICD-10-CM

## 2020-01-03 NOTE — Progress Notes (Signed)
Chief Complaint:   OBESITY Joseph Graves is here to discuss his progress with his obesity treatment plan along with follow-up of his obesity related diagnoses. Joseph Graves is on the Category 4 Plan and states he is following his eating plan approximately 90% of the time. Joseph Graves states he is walking for 30 minutes 7 times per week.  Today's visit was #: 6 Starting weight: 364 lbs Starting date: 10/19/2019 Today's weight: 324 lbs Today's date: 01/03/2020 Total lbs lost to date: 40 Total lbs lost since last in-office visit: 7  Interim History: Joseph Graves is adhering to the plan very well. He is getting his protein in most of the time. He is journaling daily and uses Category 4 as more of a guideline.  Subjective:   1. Pre-diabetes Joseph Graves denies polyphagia, but he notes cravings. He is on metformin daily. Lab Results  Component Value Date   HGBA1C 5.8 (H) 10/19/2019    Assessment/Plan:   1. Pre-diabetes Joseph Graves agreed to continue taking metformin. He will continue to work on weight loss, exercise, and decreasing simple carbohydrates to help decrease the risk of diabetes.   2. Class 2 severe obesity with serious comorbidity and body mass index (BMI) of 39.0 to 39.9 in adult, unspecified obesity type (HCC) Joseph Graves is currently in the action stage of change. As such, his goal is to continue with weight loss efforts. He has agreed to the Category 4 Plan or keeping a food journal and adhering to recommended goals of 2000-2200 calories and 115 grams of protein daily.   I gave Joseph Graves journaling calorie and protein goals since he is already journaling.  Exercise goals: Joseph Graves is to continue cardio, and add resistance twice weekly to reduce loss of muscle.  Behavioral modification strategies: increasing lean protein intake.  Joseph Graves has agreed to follow-up with our clinic in 3 weeks. He was informed of the importance of frequent follow-up visits to maximize his success with intensive lifestyle  modifications for his multiple health conditions.   Objective:   Blood pressure 112/67, pulse 62, temperature 97.8 F (36.6 C), temperature source Oral, height 6\' 4"  (1.93 m), weight (!) 324 lb (147 kg), SpO2 98 %. Body mass index is 39.44 kg/m.  General: Cooperative, alert, well developed, in no acute distress. HEENT: Conjunctivae and lids unremarkable. Cardiovascular: Regular rhythm.  Lungs: Normal work of breathing. Neurologic: No focal deficits.   Lab Results  Component Value Date   CREATININE 0.94 10/19/2019   BUN 15 10/19/2019   NA 139 10/19/2019   K 4.4 10/19/2019   CL 103 10/19/2019   CO2 22 10/19/2019   Lab Results  Component Value Date   ALT 47 (H) 10/19/2019   AST 39 10/19/2019   ALKPHOS 64 10/19/2019   BILITOT 0.7 10/19/2019   Lab Results  Component Value Date   HGBA1C 5.8 (H) 10/19/2019   HGBA1C 5.5 06/07/2018   HGBA1C 5.5 04/27/2017   Lab Results  Component Value Date   INSULIN 41.0 (H) 10/19/2019   Lab Results  Component Value Date   TSH 2.550 10/19/2019   Lab Results  Component Value Date   CHOL 138 10/19/2019   HDL 38 (L) 10/19/2019   LDLCALC 79 10/19/2019   TRIG 117 10/19/2019   CHOLHDL 3.6 06/07/2018   Lab Results  Component Value Date   WBC 6.1 06/07/2018   HGB 14.2 06/07/2018   HCT 42.4 06/07/2018   MCV 90 06/07/2018   PLT 311 06/07/2018   No results found for: IRON, TIBC,  FERRITIN  Attestation Statements:   Reviewed by clinician on day of visit: allergies, medications, problem list, medical history, surgical history, family history, social history, and previous encounter notes.   Trude Mcburney, am acting as Energy manager for [employer], FNP-C.  I have reviewed the above documentation for accuracy and completeness, and I agree with the above. -  Jesse Sans, FNP

## 2020-01-04 MED FILL — METFORMIN HCL 500 MG TABS: 500 | 30 days supply | Qty: 30 | Fill #0

## 2020-01-09 ENCOUNTER — Other Ambulatory Visit: Payer: Self-pay | Admitting: Family Medicine

## 2020-01-09 DIAGNOSIS — J3089 Other allergic rhinitis: Secondary | ICD-10-CM

## 2020-01-09 MED FILL — HYDROCHLOROTHIAZIDE 12.5 MG: 12.5 | 90 days supply | Qty: 90 | Fill #1

## 2020-01-09 MED FILL — MONTELUKAST SOD 10 MG TAB: 10 | 30 days supply | Qty: 30 | Fill #0

## 2020-01-24 ENCOUNTER — Other Ambulatory Visit: Payer: Self-pay

## 2020-01-24 ENCOUNTER — Encounter (INDEPENDENT_AMBULATORY_CARE_PROVIDER_SITE_OTHER): Payer: Self-pay | Admitting: Family Medicine

## 2020-01-24 ENCOUNTER — Ambulatory Visit (INDEPENDENT_AMBULATORY_CARE_PROVIDER_SITE_OTHER): Payer: 59 | Admitting: Family Medicine

## 2020-01-24 VITALS — BP 110/72 | HR 67 | Temp 97.5°F | Ht 76.0 in | Wt 319.0 lb

## 2020-01-24 DIAGNOSIS — R7303 Prediabetes: Secondary | ICD-10-CM

## 2020-01-24 DIAGNOSIS — I10 Essential (primary) hypertension: Secondary | ICD-10-CM

## 2020-01-24 DIAGNOSIS — Z9189 Other specified personal risk factors, not elsewhere classified: Secondary | ICD-10-CM | POA: Diagnosis not present

## 2020-01-24 DIAGNOSIS — Z6838 Body mass index (BMI) 38.0-38.9, adult: Secondary | ICD-10-CM | POA: Diagnosis not present

## 2020-01-24 MED ORDER — METFORMIN HCL 500 MG PO TABS: 500.0000 mg | ORAL_TABLET | Freq: Every day | ORAL | 0 refills | Status: DC

## 2020-01-24 NOTE — Progress Notes (Signed)
Chief Complaint:   OBESITY Joseph Graves is here to discuss his progress with his obesity treatment plan along with follow-up of his obesity related diagnoses. Joseph Graves is on the Category 4 Plan or keeping a food journal and adhering to recommended goals of 2000-2200 calories and 115 grams of protein daily and states he is following his eating plan approximately 100% of the time. Joseph Graves states he is walking and doing pushups for 30-40 minutes 7 times per week.  Today's visit was #: 7 Starting weight: 364 lbs Starting date: 10/19/2019 Today's weight: 319 lbs Today's date: 01/24/2020 Total lbs lost to date: 45 Total lbs lost since last in-office visit: 5  Interim History: Joseph Graves is doing great on his meal plan. He has lost 45 lbs total. He is journaling daily and is meeting his calorie goals. He is a bit low on protein, averaging 95 grams of protein per day. He is walking 100 miles in May for the organization "[employer]". He has lost 16 lbs of muscle mass per our bioimpedance scale since starting with [place].  Subjective:   1. Pre-diabetes Joseph Graves notes cravings at night. He does snack on carbs at times but is portioning out his snacks. Lab Results  Component Value Date   HGBA1C 5.8 (H) 10/19/2019    2. Essential hypertension Joseph Graves's blood pressure is very well controlled with hydrochlorothiazide and losartan 100 mg. His ASCVD risk score is 2.5%. The 10-year ASCVD risk score Joseph Bussing DC Brooke Bonito., et al., 2013) is: 2.5%   Values used to calculate the score:     Age: 49 years     Sex: Male     Is Non-Hispanic African American: No     Diabetic: No     Tobacco smoker: No     Systolic Blood Pressure: 376 mmHg     Is BP treated: Yes     HDL Cholesterol: 38 mg/dL     Total Cholesterol: 138 mg/dL  3. At risk for heart disease Joseph Graves is at a higher than average risk for cardiovascular disease due to obesity, OSA, HTN, and HLD.  Assessment/Plan:   1. Pre-diabetes Joseph Graves will continue to  work on weight loss, exercise, and decreasing simple carbohydrates to help decrease the risk of diabetes. We will refill metformin for 1 month.   - metFORMIN (GLUCOPHAGE) 500 MG tablet; Take 1 tablet (500 mg total) by mouth daily with lunch.  Dispense: 90 tablet; Refill: 0  2. Essential hypertension Joseph Graves will continue all of his medications, and will continue to work on healthy weight loss and exercise to improve blood pressure control. We will watch for signs of hypotension as he continues his lifestyle modifications.  3. At risk for heart disease Joseph Graves was given approximately 15 minutes of coronary artery disease prevention counseling today. He is 49 y.o. male and has risk factors for heart disease including obesity. We discussed intensive lifestyle modifications today with an emphasis on specific weight loss instructions and strategies.   Repetitive spaced learning was employed today to elicit superior memory formation and behavioral change.  4. Class 2 severe obesity with serious comorbidity and body mass index (BMI) of 38.0 to 38.9 in adult, unspecified obesity type (HCC) Joseph Graves is currently in the action stage of change. As such, his goal is to continue with weight loss efforts. He has agreed to the keeping a food journal and adhering to recommended goals of 2000-2200 calories and 115 grams of protein daily.   Exercise goals: As is, add  more resistance.  Behavioral modification strategies: increasing lean protein intake and better snacking choices.  Dal has agreed to follow-up with our clinic in 3 weeks. He was informed of the importance of frequent follow-up visits to maximize his success with intensive lifestyle modifications for his multiple health conditions.   Objective:   Blood pressure 110/72, pulse 67, temperature (!) 97.5 F (36.4 C), temperature source Oral, height 6\' 4"  (1.93 m), weight (!) 319 lb (144.7 kg), SpO2 98 %. Body mass index is 38.83 kg/m.  General:  Cooperative, alert, well developed, in no acute distress. HEENT: Conjunctivae and lids unremarkable. Cardiovascular: Regular rhythm.  Lungs: Normal work of breathing. Neurologic: No focal deficits.   Lab Results  Component Value Date   CREATININE 0.94 10/19/2019   BUN 15 10/19/2019   NA 139 10/19/2019   K 4.4 10/19/2019   CL 103 10/19/2019   CO2 22 10/19/2019   Lab Results  Component Value Date   ALT 47 (H) 10/19/2019   AST 39 10/19/2019   ALKPHOS 64 10/19/2019   BILITOT 0.7 10/19/2019   Lab Results  Component Value Date   HGBA1C 5.8 (H) 10/19/2019   HGBA1C 5.5 06/07/2018   HGBA1C 5.5 04/27/2017   Lab Results  Component Value Date   INSULIN 41.0 (H) 10/19/2019   Lab Results  Component Value Date   TSH 2.550 10/19/2019   Lab Results  Component Value Date   CHOL 138 10/19/2019   HDL 38 (L) 10/19/2019   LDLCALC 79 10/19/2019   TRIG 117 10/19/2019   CHOLHDL 3.6 06/07/2018   Lab Results  Component Value Date   WBC 6.1 06/07/2018   HGB 14.2 06/07/2018   HCT 42.4 06/07/2018   MCV 90 06/07/2018   PLT 311 06/07/2018   No results found for: IRON, TIBC, FERRITIN  Attestation Statements:   Reviewed by clinician on day of visit: allergies, medications, problem list, medical history, surgical history, family history, social history, and previous encounter notes.   06/09/2018, am acting as Trude Mcburney for Energy manager, FNP-C.  I have reviewed the above documentation for accuracy and completeness, and I agree with the above. -  [place], FNP

## 2020-01-24 NOTE — Progress Notes (Signed)
The 10-year ASCVD risk score Denman George DC Montez Hageman., et al., 2013) is: 2.5%   Values used to calculate the score:     Age: 49 years     Sex: Male     Is Non-Hispanic African American: No     Diabetic: No     Tobacco smoker: No     Systolic Blood Pressure: 110 mmHg     Is BP treated: Yes     HDL Cholesterol: 38 mg/dL     Total Cholesterol: 138 mg/dL

## 2020-02-03 ENCOUNTER — Other Ambulatory Visit: Payer: Self-pay | Admitting: Family Medicine

## 2020-02-03 ENCOUNTER — Other Ambulatory Visit (INDEPENDENT_AMBULATORY_CARE_PROVIDER_SITE_OTHER): Payer: Self-pay | Admitting: Family Medicine

## 2020-02-03 DIAGNOSIS — F4321 Adjustment disorder with depressed mood: Secondary | ICD-10-CM

## 2020-02-03 DIAGNOSIS — R7303 Prediabetes: Secondary | ICD-10-CM

## 2020-02-03 DIAGNOSIS — J3089 Other allergic rhinitis: Secondary | ICD-10-CM

## 2020-02-03 MED ORDER — MONTELUKAST SODIUM 10 MG PO TABS: 10.0000 mg | ORAL_TABLET | Freq: Every day | ORAL | 0 refills | Status: DC

## 2020-02-03 MED ORDER — BUPROPION HCL ER (XL) 150 MG PO TB24: 150.0000 mg | ORAL_TABLET | Freq: Every day | ORAL | 0 refills | Status: DC

## 2020-02-14 ENCOUNTER — Ambulatory Visit (INDEPENDENT_AMBULATORY_CARE_PROVIDER_SITE_OTHER): Payer: 59 | Admitting: Family Medicine

## 2020-02-14 ENCOUNTER — Other Ambulatory Visit: Payer: Self-pay

## 2020-02-14 ENCOUNTER — Encounter (INDEPENDENT_AMBULATORY_CARE_PROVIDER_SITE_OTHER): Payer: Self-pay | Admitting: Family Medicine

## 2020-02-14 VITALS — BP 114/60 | HR 59 | Temp 97.5°F | Ht 76.0 in | Wt 312.0 lb

## 2020-02-14 DIAGNOSIS — Z6838 Body mass index (BMI) 38.0-38.9, adult: Secondary | ICD-10-CM | POA: Diagnosis not present

## 2020-02-14 DIAGNOSIS — R7303 Prediabetes: Secondary | ICD-10-CM

## 2020-02-14 DIAGNOSIS — F3289 Other specified depressive episodes: Secondary | ICD-10-CM

## 2020-02-14 DIAGNOSIS — J3089 Other allergic rhinitis: Secondary | ICD-10-CM | POA: Diagnosis not present

## 2020-02-14 DIAGNOSIS — E786 Lipoprotein deficiency: Secondary | ICD-10-CM | POA: Diagnosis not present

## 2020-02-14 DIAGNOSIS — Z9189 Other specified personal risk factors, not elsewhere classified: Secondary | ICD-10-CM | POA: Diagnosis not present

## 2020-02-14 DIAGNOSIS — E538 Deficiency of other specified B group vitamins: Secondary | ICD-10-CM | POA: Insufficient documentation

## 2020-02-14 DIAGNOSIS — E559 Vitamin D deficiency, unspecified: Secondary | ICD-10-CM | POA: Diagnosis not present

## 2020-02-14 MED ORDER — BUPROPION HCL ER (XL) 150 MG PO TB24: 150.0000 mg | ORAL_TABLET | Freq: Every day | ORAL | 0 refills | Status: DC

## 2020-02-14 MED ORDER — MONTELUKAST SODIUM 10 MG PO TABS: 10.0000 mg | ORAL_TABLET | Freq: Every day | ORAL | 0 refills | Status: DC

## 2020-02-14 NOTE — Progress Notes (Signed)
The 10-year ASCVD risk score Denman George DC Montez Hageman., et al., 2013) is: 2.6%   Values used to calculate the score:     Age: 49 years     Sex: Male     Is Non-Hispanic African American: No     Diabetic: No     Tobacco smoker: No     Systolic Blood Pressure: 114 mmHg     Is BP treated: Yes     HDL Cholesterol: 38 mg/dL     Total Cholesterol: 138 mg/dL

## 2020-02-14 NOTE — Progress Notes (Signed)
Chief Complaint:   OBESITY Joseph Graves is here to discuss his progress with his obesity treatment plan along with follow-up of his obesity related diagnoses. Joseph Graves is on keeping a food journal and adhering to recommended goals of 2000-2200 calories and 115 grams of protein daily and states he is following his eating plan approximately 95% of the time. Joseph Graves states he is doing push ups and walking for 45-60 minutes 7 times per week.  Today's visit was #: 8 Starting weight: 364 lbs Starting date: 10/19/2019 Today's weight: 312 lbs Today's date: 02/14/2020 Total lbs lost to date: 52 Total lbs lost since last in-office visit: 7  Interim History: Joseph Graves is sticking to the plan very well. He denies excessive hunger but he notes cravings at night. He is supplementing with protein bars.  Subjective:   1. B12 deficiency Joseph Graves is on daily B12 (he is unsure of dose). Last B12 level was high at >2000.  Lab Results  Component Value Date   VITAMINB12 >2000 (H) 10/19/2019   2. 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 He is not on statin therapy.  Lab Results  Component Value Date   ALT 47 (H) 10/19/2019   AST 39 10/19/2019   ALKPHOS 64 10/19/2019   BILITOT 0.7 10/19/2019   Lab Results  Component Value Date   CHOL 138 10/19/2019   HDL 38 (L) 10/19/2019   LDLCALC 79 10/19/2019   TRIG 117 10/19/2019   CHOLHDL 3.6 06/07/2018   3. Vitamin D deficiency Joseph Graves is on 5,000 IU OTC Vit D daily. Last Vit D level was low at 40.9.  4. Pre-diabetes Joseph Graves has a diagnosis of pre-diabetes based on his elevated Hgb A1c and was informed this puts him at greater risk of developing diabetes. Joseph Graves is on metformin and he notes cravings at night. Last A1c was 5.8. He continues to work on diet and exercise to decrease his risk of diabetes. He denies nausea or hypoglycemia.  Lab Results  Component Value Date   HGBA1C 5.8 (H) 10/19/2019   Lab Results  Component Value Date   INSULIN 41.0 (H) 10/19/2019   5. Environmental and Seasonal allergies Joseph Graves notes allergies to mold, pollen, etc. He takes Flonase daily as well as Singulair. He notes his allergies are well controlled.  6. Other depression, with emotional eatimg Joseph Graves is on bupropion. He notes cravings at night.  7. At risk for heart disease Joseph Graves is at a higher than average risk for cardiovascular disease due to obesity, low HDL, and prediabetic status..   Assessment/Plan:   1. B12 deficiency  We will check labs today, and will continue to monitor.   - Vitamin B12  2. 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  - Comprehensive metabolic panel - Lipid Panel With LDL/HDL Ratio  3. Vitamin D deficiency Low Vitamin D level contributes to fatigue and are associated with obesity, breast, and colon cancer. Boniface agreed to continue taking OTC Vit D 5,000 IU daily and will follow-up for routine testing of Vitamin D, at least 2-3 times per year to avoid over-replacement. We will check labs today.  - VITAMIN D 25 Hydroxy (Vit-D Deficiency, Fractures)  4. Pre-diabetes Joseph Graves will continue to work on weight loss, exercise, and decreasing simple carbohydrates to help decrease the risk of diabetes. We will check labs today.  - Hemoglobin A1c -  Insulin, random  5.  Environmental and Seasonal allergies Joseph Graves will continue taking Singular, and we will refill for 90 days with no refills.  - montelukast (SINGULAIR) 10 MG tablet; Take 1 tablet (10 mg total) by mouth at bedtime.  Dispense: 90 tablet; Refill: 0  6. Other depression, with emotional eatimg Behavior modification techniques were discussed today to help Joseph Graves deal with his emotional/non-hunger eating behaviors. We will refill bupropion XL for 1 month. Orders and follow up as documented in patient record.   - buPROPion  (WELLBUTRIN XL) 150 MG 24 hr tablet; Take 1 tablet (150 mg total) by mouth daily.  Dispense: 30 tablet; Refill: 0  7. At risk for heart disease Joseph Graves was given approximately 15 minutes of coronary artery disease prevention counseling today. He is 49 y.o. male and has risk factors for heart disease including obesity and low HDL. We discussed intensive lifestyle modifications today with an emphasis on specific weight loss instructions and strategies.   Repetitive spaced learning was employed today to elicit superior memory formation and behavioral change.  8. Class 2 severe obesity with serious comorbidity and body mass index (BMI) of 38.0 to 38.9 in adult, unspecified obesity type (HCC) Joseph Graves is currently in the action stage of change. As such, his goal is to continue with weight loss efforts. He has agreed to keeping a food journal and adhering to recommended goals of 2000-2200 calories and 115 grams of protein daily.   Exercise goals: As is.  Behavioral modification strategies: decreasing simple carbohydrates and keeping a strict food journal.  Joseph Graves has agreed to follow-up with our clinic in 3 weeks. He was informed of the importance of frequent follow-up visits to maximize his success with intensive lifestyle modifications for his multiple health conditions.   Joseph Graves was informed we would discuss his lab results at his next visit unless there is a critical issue that needs to be addressed sooner. Joseph Graves agreed to keep his next visit at the agreed upon time to discuss these results.  Objective:   Blood pressure 114/60, pulse (!) 59, temperature (!) 97.5 F (36.4 C), temperature source Oral, height 6\' 4"  (1.93 m), weight (!) 312 lb (141.5 kg), SpO2 98 %. Body mass index is 37.98 kg/m.  General: Cooperative, alert, well developed, in no acute distress. HEENT: Conjunctivae and lids unremarkable. Cardiovascular: Regular rhythm.  Lungs: Normal work of breathing. Neurologic: No focal  deficits.   Lab Results  Component Value Date   CREATININE 0.94 10/19/2019   BUN 15 10/19/2019   NA 139 10/19/2019   K 4.4 10/19/2019   CL 103 10/19/2019   CO2 22 10/19/2019   Lab Results  Component Value Date   ALT 47 (H) 10/19/2019   AST 39 10/19/2019   ALKPHOS 64 10/19/2019   BILITOT 0.7 10/19/2019   Lab Results  Component Value Date   HGBA1C 5.8 (H) 10/19/2019   HGBA1C 5.5 06/07/2018   HGBA1C 5.5 04/27/2017   Lab Results  Component Value Date   INSULIN 41.0 (H) 10/19/2019   Lab Results  Component Value Date   TSH 2.550 10/19/2019   Lab Results  Component Value Date   CHOL 138 10/19/2019   HDL 38 (L) 10/19/2019   LDLCALC 79 10/19/2019   TRIG 117 10/19/2019   CHOLHDL 3.6 06/07/2018   Lab Results  Component Value Date   WBC 6.1 06/07/2018   HGB 14.2 06/07/2018   HCT 42.4 06/07/2018   MCV 90 06/07/2018   PLT 311 06/07/2018  No results found for: IRON, TIBC, FERRITIN  Attestation Statements:   Reviewed by clinician on day of visit: allergies, medications, problem list, medical history, surgical history, family history, social history, and previous encounter notes.   Wilhemena Durie, am acting as Location manager for [employer], FNP-C.  I have reviewed the above documentation for accuracy and completeness, and I agree with the above. -  Georgianne Fick, FNP

## 2020-02-15 LAB — COMPREHENSIVE METABOLIC PANEL
ALT: 22 IU/L (ref 0–44)
AST: 18 IU/L (ref 0–40)
Albumin/Globulin Ratio: 1.7 (ref 1.2–2.2)
Albumin: 4.5 g/dL (ref 4.0–5.0)
Alkaline Phosphatase: 84 IU/L (ref 48–121)
BUN/Creatinine Ratio: 18 (ref 9–20)
BUN: 16 mg/dL (ref 6–24)
Bilirubin Total: 0.9 mg/dL (ref 0.0–1.2)
CO2: 24 mmol/L (ref 20–29)
Calcium: 9.7 mg/dL (ref 8.7–10.2)
Chloride: 101 mmol/L (ref 96–106)
Creatinine, Ser: 0.9 mg/dL (ref 0.76–1.27)
GFR calc Af Amer: 115 mL/min/{1.73_m2} (ref >59)
GFR calc non Af Amer: 99 mL/min/{1.73_m2} (ref >59)
Globulin, Total: 2.6 g/dL (ref 1.5–4.5)
Glucose: 83 mg/dL (ref 65–99)
Potassium: 4.3 mmol/L (ref 3.5–5.2)
Sodium: 139 mmol/L (ref 134–144)
Total Protein: 7.1 g/dL (ref 6.0–8.5)

## 2020-02-15 LAB — HEMOGLOBIN A1C
Est. average glucose Bld gHb Est-mCnc: 105 mg/dL
Hgb A1c MFr Bld: 5.3 % (ref 4.8–5.6)

## 2020-02-15 LAB — LIPID PANEL WITH LDL/HDL RATIO
Cholesterol, Total: 109 mg/dL (ref 100–199)
HDL: 39 mg/dL — ABNORMAL LOW (ref >39)
LDL Chol Calc (NIH): 55 mg/dL (ref 0–99)
LDL/HDL Ratio: 1.4 ratio (ref 0.0–3.6)
Triglycerides: 71 mg/dL (ref 0–149)
VLDL Cholesterol Cal: 15 mg/dL (ref 5–40)

## 2020-02-15 LAB — VITAMIN D 25 HYDROXY (VIT D DEFICIENCY, FRACTURES): Vit D, 25-Hydroxy: 72.6 ng/mL (ref 30.0–100.0)

## 2020-02-15 LAB — VITAMIN B12: Vitamin B-12: >2000 pg/mL — ABNORMAL HIGH (ref 232–1245)

## 2020-02-15 LAB — INSULIN, RANDOM: INSULIN: 14.8 u[IU]/mL (ref 2.6–24.9)

## 2020-02-23 ENCOUNTER — Other Ambulatory Visit: Payer: Self-pay | Admitting: Family Medicine

## 2020-02-23 DIAGNOSIS — F3289 Other specified depressive episodes: Secondary | ICD-10-CM

## 2020-02-23 MED FILL — buPROPion HCL ER (XL) 150 M: 150 | 60 days supply | Qty: 120 | Fill #0

## 2020-02-23 NOTE — Telephone Encounter (Signed)
Please call pt to schedule appt for CPE.   No FBW needed. No further refills until pt is seen.  Tiajuana Amass, CMA

## 2020-03-01 DIAGNOSIS — G4733 Obstructive sleep apnea (adult) (pediatric): Secondary | ICD-10-CM | POA: Diagnosis not present

## 2020-03-06 ENCOUNTER — Ambulatory Visit (INDEPENDENT_AMBULATORY_CARE_PROVIDER_SITE_OTHER): Payer: 59 | Admitting: Family Medicine

## 2020-03-06 ENCOUNTER — Other Ambulatory Visit: Payer: Self-pay

## 2020-03-06 ENCOUNTER — Encounter (INDEPENDENT_AMBULATORY_CARE_PROVIDER_SITE_OTHER): Payer: Self-pay | Admitting: Family Medicine

## 2020-03-06 VITALS — BP 119/71 | HR 61 | Temp 97.8°F | Ht 76.0 in | Wt 308.0 lb

## 2020-03-06 DIAGNOSIS — E538 Deficiency of other specified B group vitamins: Secondary | ICD-10-CM

## 2020-03-06 DIAGNOSIS — E559 Vitamin D deficiency, unspecified: Secondary | ICD-10-CM

## 2020-03-06 DIAGNOSIS — E88819 Insulin resistance, unspecified: Secondary | ICD-10-CM | POA: Insufficient documentation

## 2020-03-06 DIAGNOSIS — E8881 Metabolic syndrome: Secondary | ICD-10-CM | POA: Diagnosis not present

## 2020-03-06 DIAGNOSIS — Z6837 Body mass index (BMI) 37.0-37.9, adult: Secondary | ICD-10-CM | POA: Diagnosis not present

## 2020-03-06 MED ORDER — METFORMIN HCL 500 MG PO TABS: 500.0000 mg | ORAL_TABLET | Freq: Two times a day (BID) | ORAL | 0 refills | Status: DC

## 2020-03-06 MED ORDER — VITAMIN D3 125 MCG (5000 UT) PO CAPS: ORAL_CAPSULE | ORAL | 0 refills | Status: DC

## 2020-03-06 MED FILL — METFORMIN HCL 500 MG TABS: 500 | 30 days supply | Qty: 60 | Fill #0

## 2020-03-06 NOTE — Progress Notes (Signed)
Chief Complaint:   OBESITY Joseph Graves is here to discuss his progress with his obesity treatment plan along with follow-up of his obesity related diagnoses. Joseph Graves is on keeping a food journal and adhering to recommended goals of 2000-2200 calories and 115 grams of protein daily and states he is following his eating plan approximately 100% of the time. Joseph Graves states he is walking, doing push ups, squats, and at the gym for 60 minutes 5 times per week.  Today's visit was #: 9 Starting weight: 364 lbs  Starting date: 10/19/2019 Today's weight: 308 lbs Today's date: 03/06/2020 Total lbs lost to date: 56 Total lbs lost since last in-office visit: 4  Interim History: Joseph Graves is doing a great job on the plan and has lost 56 lbs. Joseph Graves is journaling daily. He is averaging 117 grams of of protein daily. He denies hunger but notes cravings/hunger in the evenings. He is doing very well on the plan.   Subjective:   1. Insulin resistance Joseph Graves has a diagnosis of insulin resistance based on his elevated fasting insulin level >5. He notes cravings/hunger at night. He is on metformin which he is taking at lunch.   Lab Results  Component Value Date   INSULIN 14.8 02/14/2020   INSULIN 41.0 (H) 10/19/2019   Lab Results  Component Value Date   HGBA1C 5.3 02/14/2020   2. Vitamin D deficiency Joseph Graves's Vit D level is at goal on 10,000 IU daily. Vit D is 72.6, up from 40.9.  3. B12 deficiency Joseph Graves discontinued B12 supplementation recently. His B12 level was high at >2000.  Lab Results  Component Value Date   VITAMINB12 >2000 (H) 02/14/2020   Assessment/Plan:   1. Insulin resistance Joseph Graves will continue to work on weight loss, exercise, and decreasing simple carbohydrates to help decrease the risk of diabetes. Joseph Graves agreed to increase metformin to 500 mg BID with meals (to be taken at lunch and dinner), and we will refill for 1 month. Joseph Graves agreed to follow-up with [place] as directed to closely  monitor his progress.  - metFORMIN (GLUCOPHAGE) 500 MG tablet; Take 1 tablet (500 mg total) by mouth 2 (two) times daily with a meal.  Dispense: 60 tablet; Refill: 0  2. Vitamin D deficiency To prevent over-replacement, Joseph Graves agreed to take OTC Vit D 5,000 IU Monday-Friday, and 10,000 IU on weekends, and will follow-up for routine testing of Vitamin D, at least 2-3 times per year to avoid over-replacement.  3. B12 deficiency B12 still high. Continue without supplementation. We will continue to monitor.   4. Class 2 severe obesity with serious comorbidity and body mass index (BMI) of 37.0 to 37.9 in adult, unspecified obesity type (HCC) Joseph Graves is currently in the action stage of change. As such, his goal is to continue with weight loss efforts. He has agreed to keeping a food journal and adhering to recommended goals of 2000-2200 calories and 115 grams of protein daily.   We discussed Joseph Graves briefly but he is not interested at this point. We also discussed topiramate. He has no history of kidney stones or glaucoma. If night hunger/cravings continue with extra dose of metformin, will consider starting topiramate.  Exercise goals: As is.  Behavioral modification strategies: planning for success and keeping a strict food journal.  [place]le has agreed to follow-up with our clinic in 3 weeks with Joseph Graves. He was informed of the importance of frequent follow-up visits to maximize his success with intensive lifestyle modifications for his multiple health  conditions.   Objective:   Blood pressure 119/71, pulse 61, temperature 97.8 F (36.6 C), temperature source Oral, height 6\' 4"  (1.93 m), weight (!) 308 lb (139.7 kg), SpO2 97 %. Body mass index is 37.49 kg/m.  General: Cooperative, alert, well developed, in no acute distress. HEENT: Conjunctivae and lids unremarkable. Cardiovascular: Regular rhythm.  Lungs: Normal work of breathing. Neurologic: No focal deficits.   Lab Results    Component Value Date   CREATININE 0.90 02/14/2020   BUN 16 02/14/2020   NA 139 02/14/2020   K 4.3 02/14/2020   CL 101 02/14/2020   CO2 24 02/14/2020   Lab Results  Component Value Date   ALT 22 02/14/2020   AST 18 02/14/2020   ALKPHOS 84 02/14/2020   BILITOT 0.9 02/14/2020   Lab Results  Component Value Date   HGBA1C 5.3 02/14/2020   HGBA1C 5.8 (H) 10/19/2019   HGBA1C 5.5 06/07/2018   HGBA1C 5.5 04/27/2017   Lab Results  Component Value Date   INSULIN 14.8 02/14/2020   INSULIN 41.0 (H) 10/19/2019   Lab Results  Component Value Date   TSH 2.550 10/19/2019   Lab Results  Component Value Date   CHOL 109 02/14/2020   HDL 39 (L) 02/14/2020   LDLCALC 55 02/14/2020   TRIG 71 02/14/2020   CHOLHDL 3.6 06/07/2018   Lab Results  Component Value Date   WBC 6.1 06/07/2018   HGB 14.2 06/07/2018   HCT 42.4 06/07/2018   MCV 90 06/07/2018   PLT 311 06/07/2018   No results found for: IRON, TIBC, FERRITIN  Attestation Statements:   Reviewed by clinician on day of visit: allergies, medications, problem list, medical history, surgical history, family history, social history, and previous encounter notes.   Wilhemena Durie, am acting as Location manager for [employer], FNP-C.  I have reviewed the above documentation for accuracy and completeness, and I agree with the above. -  Georgianne Fick, FNP

## 2020-03-20 ENCOUNTER — Encounter: Payer: Self-pay | Admitting: Physician Assistant

## 2020-03-22 ENCOUNTER — Encounter (INDEPENDENT_AMBULATORY_CARE_PROVIDER_SITE_OTHER): Payer: Self-pay

## 2020-03-26 ENCOUNTER — Other Ambulatory Visit: Payer: Self-pay | Admitting: Family Medicine

## 2020-03-26 DIAGNOSIS — I10 Essential (primary) hypertension: Secondary | ICD-10-CM

## 2020-03-27 ENCOUNTER — Other Ambulatory Visit: Payer: Self-pay | Admitting: Physician Assistant

## 2020-03-27 ENCOUNTER — Other Ambulatory Visit: Payer: Self-pay

## 2020-03-27 ENCOUNTER — Encounter (INDEPENDENT_AMBULATORY_CARE_PROVIDER_SITE_OTHER): Payer: Self-pay | Admitting: Family Medicine

## 2020-03-27 ENCOUNTER — Ambulatory Visit (INDEPENDENT_AMBULATORY_CARE_PROVIDER_SITE_OTHER): Payer: 59 | Admitting: Family Medicine

## 2020-03-27 VITALS — BP 119/74 | HR 70 | Temp 98.1°F | Ht 76.0 in | Wt 309.0 lb

## 2020-03-27 DIAGNOSIS — F3289 Other specified depressive episodes: Secondary | ICD-10-CM | POA: Diagnosis not present

## 2020-03-27 DIAGNOSIS — I10 Essential (primary) hypertension: Secondary | ICD-10-CM | POA: Diagnosis not present

## 2020-03-27 DIAGNOSIS — Z6837 Body mass index (BMI) 37.0-37.9, adult: Secondary | ICD-10-CM | POA: Diagnosis not present

## 2020-03-27 DIAGNOSIS — E559 Vitamin D deficiency, unspecified: Secondary | ICD-10-CM | POA: Diagnosis not present

## 2020-03-27 DIAGNOSIS — Z9189 Other specified personal risk factors, not elsewhere classified: Secondary | ICD-10-CM | POA: Diagnosis not present

## 2020-03-27 DIAGNOSIS — E8881 Metabolic syndrome: Secondary | ICD-10-CM

## 2020-03-27 MED ORDER — LOSARTAN POTASSIUM 100 MG PO TABS: 100.0000 mg | ORAL_TABLET | Freq: Every day | ORAL | 0 refills | Status: DC

## 2020-03-27 MED FILL — LOSARTAN POTASSIUM 100 MG T: 100 | 60 days supply | Qty: 60 | Fill #0

## 2020-03-28 ENCOUNTER — Encounter: Payer: 59 | Admitting: Physician Assistant

## 2020-03-28 NOTE — Progress Notes (Signed)
Chief Complaint:   OBESITY Joseph Graves is here to discuss his progress with his obesity treatment plan along with follow-up of his obesity related diagnoses. [name] is on keeping a food journal and adhering to recommended goals of 2000-2200 calories and 115 grams of protein and states he is following his eating plan approximately 100% of the time. [name] states he is walking, doing squats, and doing push-ups for 60 minutes 7 times per week.  Today's visit was #: 10 Starting weight: 364 lbs Starting date: 10/19/2019 Today's weight: 309 lbs Today's date: 03/27/2020 Total lbs lost to date: 55 lbs Total lbs lost since last in-office visit: 0  Interim History: Joseph Graves says he has been eating his feelings over the last couple of weeks.  His mother moved in with him and his family.  He says that he and his wife have been struggling in their interpersonal relations.  He is using Lose It and is averaging between 100-120 grams of protein daily with calories averaging 2200-2300 per day.  He is diong push-ups, squats, and walking daily.  Subjective:   1. Insulin resistance [name] has a diagnosis of insulin resistance based on his elevated fasting insulin level >5. He continues to work on diet and exercise to decrease his risk of diabetes.  He is on metformin and is tolerating it well with no side effects.  Lab Results  Component Value Date   INSULIN 14.8 02/14/2020   INSULIN 41.0 (H) 10/19/2019   Lab Results  Component Value Date   HGBA1C 5.3 02/14/2020   2. Essential hypertension Review: taking medications as instructed, no medication side effects noted, no chest pain on exertion, no dyspnea on exertion, no swelling of ankles.  CMP was within normal limits at the end of May 2021.  Well-controlled currently.  He is asymptomatic.    BP Readings from Last 3 Encounters:  03/27/20 119/74  03/06/20 119/71  02/14/20 114/60   3. Vitamin D deficiency Joseph Graves's Vitamin D level was 72.6 on 02/14/2020.  He is currently taking OTC vitamin D 5000 IU Monday-Friday and 10,000 IU on Saturday and Sunday each day. He denies nausea, vomiting or muscle weakness.  4. Other depression, with emotional eating Joseph Graves is struggling with emotional eating and using food for comfort to the extent that it is negatively impacting his health. He has been working on behavior modification techniques to help reduce his emotional eating and has been successful. He shows no sign of suicidal or homicidal ideations.  He is taking Wellbutrin.  Mood is stable.  Assessment/Plan:   1. Insulin resistance Joseph Graves will continue to work on weight loss, exercise, and decreasing simple carbohydrates to help decrease the risk of diabetes. Joseph Graves agreed to follow-up with [place] as directed to closely monitor his progress.  Continue prudent nutritional plan, weight loss, and will continue to monitor.  2. Essential hypertension Joseph Graves is working on healthy weight loss and exercise to improve blood pressure control. We will watch for signs of hypotension as he continues his lifestyle modifications.  Continue losartan and HCTZ per PCP at current dose.  He know that as he continues with weight loss, blood pressure may decrease, thus keep a closer eye on it at home.  3. Vitamin D deficiency Low Vitamin D level contributes to fatigue and are associated with obesity, breast, and colon cancer. He agrees to continue to take prescription Vitamin D @50 ,000 IU every week and will follow-up for routine testing of Vitamin D, at least 2-3 times per  year to avoid over-replacement.  4. Other depression, with emotional eating Behavior modification techniques were discussed today to help Lem deal with his emotional/non-hunger eating behaviors.  Orders and follow up as documented in patient record.  Declines Dr. Dewaine Conger referral at this time.  He will consider.  He can do a self-referral in future.  5. Class 2 severe obesity with serious comorbidity and body  mass index (BMI) of 37.0 to 37.9 in adult, unspecified obesity type (HCC) Joseph Graves is currently in the action stage of change. As such, his goal is to continue with weight loss efforts. He has agreed to keeping a food journal and adhering to recommended goals of 2200-2300 calories and 110+ grams of protein.   Exercise goals: As is.  Behavioral modification strategies: keeping healthy foods in the home, emotional eating strategies, and stress management techniques.  [name] has agreed to follow-up with our clinic in 3 weeks. He was informed of the importance of frequent follow-up visits to maximize his success with intensive lifestyle modifications for his multiple health conditions.   Objective:   Blood pressure 119/74, pulse 70, temperature 98.1 F (36.7 C), temperature source Oral, height 6\' 4"  (1.93 m), weight (!) 309 lb (140.2 kg), SpO2 98 %. Body mass index is 37.61 kg/m.  General: Cooperative, alert, well developed, in no acute distress. HEENT: Conjunctivae and lids unremarkable. Cardiovascular: Regular rhythm.  Lungs: Normal work of breathing. Neurologic: No focal deficits.   Lab Results  Component Value Date   CREATININE 0.90 02/14/2020   BUN 16 02/14/2020   NA 139 02/14/2020   K 4.3 02/14/2020   CL 101 02/14/2020   CO2 24 02/14/2020   Lab Results  Component Value Date   ALT 22 02/14/2020   AST 18 02/14/2020   ALKPHOS 84 02/14/2020   BILITOT 0.9 02/14/2020   Lab Results  Component Value Date   HGBA1C 5.3 02/14/2020   HGBA1C 5.8 (H) 10/19/2019   HGBA1C 5.5 06/07/2018   HGBA1C 5.5 04/27/2017   Lab Results  Component Value Date   INSULIN 14.8 02/14/2020   INSULIN 41.0 (H) 10/19/2019   Lab Results  Component Value Date   TSH 2.550 10/19/2019   Lab Results  Component Value Date   CHOL 109 02/14/2020   HDL 39 (L) 02/14/2020   LDLCALC 55 02/14/2020   TRIG 71 02/14/2020   CHOLHDL 3.6 06/07/2018   Lab Results  Component Value Date   WBC 6.1 06/07/2018    HGB 14.2 06/07/2018   HCT 42.4 06/07/2018   MCV 90 06/07/2018   PLT 311 06/07/2018   Attestation Statements:   Reviewed by clinician on day of visit: allergies, medications, problem list, medical history, surgical history, family history, social history, and previous encounter notes.  Time spent on visit including pre-visit chart review and post-visit care and charting was 25 minutes.   I, 06/09/2018, CMA, am acting as Insurance claims handler for Energy manager, DO.  I have reviewed the above documentation for accuracy and completeness, and I agree with the above. [employer], DO

## 2020-04-02 MED FILL — MONTELUKAST SOD 10 MG TAB: 10 | 90 days supply | Qty: 90 | Fill #0

## 2020-04-03 ENCOUNTER — Other Ambulatory Visit (INDEPENDENT_AMBULATORY_CARE_PROVIDER_SITE_OTHER): Payer: Self-pay | Admitting: Family Medicine

## 2020-04-03 DIAGNOSIS — J3089 Other allergic rhinitis: Secondary | ICD-10-CM

## 2020-04-03 MED ORDER — MONTELUKAST SODIUM 10 MG PO TABS: 10.0000 mg | ORAL_TABLET | Freq: Every day | ORAL | 0 refills | Status: DC

## 2020-04-06 ENCOUNTER — Other Ambulatory Visit: Payer: Self-pay | Admitting: Family Medicine

## 2020-04-06 DIAGNOSIS — I10 Essential (primary) hypertension: Secondary | ICD-10-CM

## 2020-04-18 ENCOUNTER — Encounter (INDEPENDENT_AMBULATORY_CARE_PROVIDER_SITE_OTHER): Payer: Self-pay | Admitting: Family Medicine

## 2020-04-18 ENCOUNTER — Other Ambulatory Visit: Payer: Self-pay

## 2020-04-18 ENCOUNTER — Ambulatory Visit (INDEPENDENT_AMBULATORY_CARE_PROVIDER_SITE_OTHER): Payer: 59 | Admitting: Family Medicine

## 2020-04-18 VITALS — BP 103/68 | HR 60 | Temp 97.4°F | Ht 76.0 in | Wt 308.0 lb

## 2020-04-18 DIAGNOSIS — R7401 Elevation of levels of liver transaminase levels: Secondary | ICD-10-CM | POA: Diagnosis not present

## 2020-04-18 DIAGNOSIS — E786 Lipoprotein deficiency: Secondary | ICD-10-CM

## 2020-04-18 DIAGNOSIS — R7303 Prediabetes: Secondary | ICD-10-CM | POA: Diagnosis not present

## 2020-04-18 DIAGNOSIS — E66812 Morbid (severe) obesity due to excess calories: Secondary | ICD-10-CM

## 2020-04-18 DIAGNOSIS — I10 Essential (primary) hypertension: Secondary | ICD-10-CM | POA: Diagnosis not present

## 2020-04-18 DIAGNOSIS — Z6837 Body mass index (BMI) 37.0-37.9, adult: Secondary | ICD-10-CM

## 2020-04-18 DIAGNOSIS — Z9189 Other specified personal risk factors, not elsewhere classified: Secondary | ICD-10-CM

## 2020-04-18 NOTE — Progress Notes (Signed)
Chief Complaint:   OBESITY Joseph Graves is here to discuss his progress with his obesity treatment plan along with follow-up of his obesity related diagnoses. Joseph Graves is on keeping a food journal and adhering to recommended goals of 2000-2200 grams of calories and 115 grams of protein and states he is following his eating plan approximately 100% of the time. Joseph Graves states he is walking for 60 minutes 7 times per week.  Today's visit was #: 11 Starting weight: 364 lbs Starting date: 10/19/2019 Today's weight: 308 lbs Today's date: 04/19/2020 Total lbs lost to date: 56 lbs Total lbs lost since last in-office visit: 1 lb  Interim History: Joseph Graves says he went on vacation in the mountains of [place] for about a week or so.  He says he drank more ETOH (micro brews and ciders) and had a blast.  "I went off plan on vacation and enjoyed myself."  He is using Lose It to track and uses it daily.  Overall, he says he likes the plan.  His hunger and cravings are controlled when he is on the plan.  He says he is currently doing 100 squats per day and walking 2-3 miles everyday.  Subjective:   1. Essential hypertension Review: taking medications as instructed, no medication side effects noted, no chest pain on exertion, no dyspnea on exertion, no swelling of ankles.  He is not checking his blood pressure at home.  Denies dizziness or lightheadedness.  Since starting with [place] in January, he has lost 56 pounds.  BP Readings from Last 3 Encounters:  04/18/20 103/68  03/27/20 119/74  03/06/20 119/71   2. Transaminitis ALT with history of being 47 in January.  Last was 22.  Labs last checked in May, but patient had a 40 pound weight loss in between lab checks.  He has lost 4 pounds since May.  Lab Results  Component Value Date   ALT 22 02/14/2020   AST 18 02/14/2020   ALKPHOS 84 02/14/2020   BILITOT 0.9 02/14/2020   3. Prediabetes Joseph Graves has a diagnosis of prediabetes based on his elevated HgA1c and  was informed this puts him at greater risk of developing diabetes. He continues to work on diet and exercise to decrease his risk of diabetes. He denies nausea or hypoglycemia.  He is not taking metformin.  A1c was 5.8 in January and is now 5.3.  Fasting insulin was 41.0 and is now 14.8.  Lab Results  Component Value Date   HGBA1C 5.3 02/14/2020   Lab Results  Component Value Date   INSULIN 14.8 02/14/2020   INSULIN 41.0 (H) 10/19/2019   4. Low HDL (under 40) Joseph Graves has hyperlipidemia and has been trying to improve his cholesterol levels with intensive lifestyle modification including a low saturated fat diet, exercise and weight loss. He denies any chest pain, claudication or myalgias.  HDL was 38 in January and is now 59.  Lab Results  Component Value Date   ALT 22 02/14/2020   AST 18 02/14/2020   ALKPHOS 84 02/14/2020   BILITOT 0.9 02/14/2020   Lab Results  Component Value Date   CHOL 109 02/14/2020   HDL 39 (L) 02/14/2020   LDLCALC 55 02/14/2020   TRIG 71 02/14/2020   CHOLHDL 3.6 06/07/2018   5. At risk for heart disease Joseph Graves is at a higher than average risk for cardiovascular disease due to obesity, low HDL, and hypertension.   Assessment/Plan:   1. Essential hypertension Laray is working on  healthy weight loss and exercise to improve blood pressure control. We will watch for signs of hypotension as he continues his lifestyle modifications.  Trial of discontinuing HCTZ since blood pressure is so low since such significant weight loss.  He will check at home 3 days per week and follow-up in 3 weeks and bring his blood pressure log.  Continue prudent nutritional plan and weight loss.  2. Transaminitis Will check labs at the 4-6 month mark once weight loss picks up again.  3. Prediabetes Joseph Graves will continue to work on weight loss, exercise, and decreasing simple carbohydrates to help decrease the risk of diabetes.  Discontinue metformin since he is not taking it.   Continue prudent nutritional plan, weight loss.  4. Low HDL (under 40) Cardiovascular risk and specific lipid/LDL goals reviewed.  We discussed several lifestyle modifications today and Joseph Graves will continue to work on diet, exercise and weight loss efforts. Orders and follow up as documented in patient record.  Increase intensity of cardio he is doing.  Continue prudent nutritional plan, weight loss.  Counseling Intensive lifestyle modifications are the first line treatment for this issue. . Dietary changes: Increase soluble fiber. Decrease simple carbohydrates. . Exercise changes: Moderate to vigorous-intensity aerobic activity 150 minutes per week if tolerated. . Lipid-lowering medications: see documented in medical record.  5. At risk for heart disease Joseph Graves was given approximately 15 minutes of coronary artery disease prevention counseling today. He is 49 y.o. male and has risk factors for heart disease including obesity. We discussed intensive lifestyle modifications today with an emphasis on specific weight loss instructions and strategies.   Repetitive spaced learning was employed today to elicit superior memory formation and behavioral change.  6. Class 2 severe obesity with serious comorbidity and body mass index (BMI) of 37.0 to 37.9 in adult, unspecified obesity type (HCC) Joseph Graves is currently in the action stage of change. As such, his goal is to continue with weight loss efforts. He has agreed to keeping a food journal and adhering to recommended goals of 2000 calories and 115 grams of protein.   Exercise goals: Increase intensity of walking in intervals and increase resistance training.  Behavioral modification strategies: increasing water intake, decreasing eating out and planning for success.  Joseph Graves has agreed to follow-up with our clinic in 2 weeks. He was informed of the importance of frequent follow-up visits to maximize his success with intensive lifestyle modifications  for his multiple health conditions.   Objective:   Blood pressure 103/68, pulse 60, temperature (!) 97.4 F (36.3 C), temperature source Oral, height 6\' 4"  (1.93 m), weight (!) 308 lb (139.7 kg), SpO2 98 %. Body mass index is 37.49 kg/m.  General: Cooperative, alert, well developed, in no acute distress. HEENT: Conjunctivae and lids unremarkable. Cardiovascular: Regular rhythm.  Lungs: Normal work of breathing. Neurologic: No focal deficits.   Lab Results  Component Value Date   CREATININE 0.90 02/14/2020   BUN 16 02/14/2020   NA 139 02/14/2020   K 4.3 02/14/2020   CL 101 02/14/2020   CO2 24 02/14/2020   Lab Results  Component Value Date   ALT 22 02/14/2020   AST 18 02/14/2020   ALKPHOS 84 02/14/2020   BILITOT 0.9 02/14/2020   Lab Results  Component Value Date   HGBA1C 5.3 02/14/2020   HGBA1C 5.8 (H) 10/19/2019   HGBA1C 5.5 06/07/2018   HGBA1C 5.5 04/27/2017   Lab Results  Component Value Date   INSULIN 14.8 02/14/2020   INSULIN 41.0 (  H) 10/19/2019   Lab Results  Component Value Date   TSH 2.550 10/19/2019   Lab Results  Component Value Date   CHOL 109 02/14/2020   HDL 39 (L) 02/14/2020   LDLCALC 55 02/14/2020   TRIG 71 02/14/2020   CHOLHDL 3.6 06/07/2018   Lab Results  Component Value Date   WBC 6.1 06/07/2018   HGB 14.2 06/07/2018   HCT 42.4 06/07/2018   MCV 90 06/07/2018   PLT 311 06/07/2018   Attestation Statements:   Reviewed by clinician on day of visit: allergies, medications, problem list, medical history, surgical history, family history, social history, and previous encounter notes.  I, Insurance claims handler, CMA, am acting as Energy manager for [employer], DO.  I have reviewed the above documentation for accuracy and completeness, and I agree with the above. Thomasene Lot, DO

## 2020-05-09 ENCOUNTER — Other Ambulatory Visit: Payer: Self-pay

## 2020-05-09 ENCOUNTER — Ambulatory Visit (INDEPENDENT_AMBULATORY_CARE_PROVIDER_SITE_OTHER): Payer: 59 | Admitting: Family Medicine

## 2020-05-09 ENCOUNTER — Encounter (INDEPENDENT_AMBULATORY_CARE_PROVIDER_SITE_OTHER): Payer: Self-pay | Admitting: Family Medicine

## 2020-05-09 VITALS — BP 105/70 | HR 61 | Temp 98.1°F | Ht 76.0 in | Wt 316.0 lb

## 2020-05-09 DIAGNOSIS — Z9189 Other specified personal risk factors, not elsewhere classified: Secondary | ICD-10-CM | POA: Diagnosis not present

## 2020-05-09 DIAGNOSIS — R7303 Prediabetes: Secondary | ICD-10-CM

## 2020-05-09 DIAGNOSIS — I10 Essential (primary) hypertension: Secondary | ICD-10-CM

## 2020-05-09 DIAGNOSIS — Z6838 Body mass index (BMI) 38.0-38.9, adult: Secondary | ICD-10-CM | POA: Diagnosis not present

## 2020-05-09 MED ORDER — METFORMIN HCL 500 MG PO TABS: 500.0000 mg | ORAL_TABLET | Freq: Two times a day (BID) | ORAL | 0 refills | Status: DC

## 2020-05-09 MED FILL — METFORMIN HCL 500 MG TABS: 500 | 30 days supply | Qty: 60 | Fill #0

## 2020-05-09 NOTE — Progress Notes (Signed)
Chief Complaint:   OBESITY Joseph Graves is here to discuss his progress with his obesity treatment plan along with follow-up of his obesity related diagnoses. Joseph Graves is on keeping a food journal and adhering to recommended goals of 2000 calories and 115 grams of protein and states he is following his eating plan approximately 50% of the time. Joseph Graves states he is exercising for 0 minutes 0 times per week.  Today's visit was #: 12 Starting weight: 364 lbs Starting date: 10/19/2019 Today's weight: 316 lbs Today's date: 05/21/2020 Total lbs lost to date: 48 lbs Total lbs lost since last in-office visit: 0  Interim History: Joseph Graves says he has had more hunger than usual.  He has not been taking his metformin at all for several weeks.  He says he just forgets to take it.  He says his protein intake is down from 120-115 grams to 85 grams or so and carbs are around 170-140, and his goal is 130 or less.  Subjective:   1. Essential hypertension Review: taking medications as instructed, no medication side effects noted, no chest pain on exertion, no dyspnea on exertion, no swelling of ankles.  Blood pressure is much improved from prior.  BP Readings from Last 3 Encounters:  05/09/20 105/70  04/18/20 103/68  03/27/20 119/74   2. Prediabetes Joseph Graves has a diagnosis of prediabetes based on his elevated HgA1c and was informed this puts him at greater risk of developing diabetes. He continues to work on diet and exercise to decrease his risk of diabetes. He denies nausea or hypoglycemia.    Lab Results  Component Value Date   HGBA1C 5.3 02/14/2020   Lab Results  Component Value Date   INSULIN 14.8 02/14/2020   INSULIN 41.0 (H) 10/19/2019   3. At risk for diabetes mellitus Joseph Graves is at higher than average risk for developing diabetes due to his obesity.   Assessment/Plan:   1. Essential hypertension Joseph Graves is working on healthy weight loss and exercise to improve blood pressure control. We  will watch for signs of hypotension as he continues his lifestyle modifications.  Blood pressure is at goal.  Continue prudent nutritional plan, weight loss.  2. Prediabetes Joseph Graves will continue to work on weight loss, exercise, and decreasing simple carbohydrates to help decrease the risk of diabetes.  Recheck labs every 3-4 months.  Continue prudent nutritional plan and weight loss.  Reviewed extensively carbs, insulin levels, hunger pathway, etc. with him.  - Refill metFORMIN (GLUCOPHAGE) 500 MG tablet; Take 1 tablet (500 mg total) by mouth 2 (two) times daily with a meal.  Dispense: 60 tablet; Refill: 0  3. At risk for diabetes mellitus Joseph Graves was given approximately 30 minutes of diabetes education and counseling today. We discussed intensive lifestyle modifications today with an emphasis on weight loss as well as increasing exercise and decreasing simple carbohydrates in his diet. We also reviewed medication options with an emphasis on risk versus benefit of those discussed.   Repetitive spaced learning was employed today to elicit superior memory formation and behavioral change.  4. Class 2 severe obesity with serious comorbidity and body mass index (BMI) of 38.0 to 38.9 in adult, unspecified obesity type (HCC) Joseph Graves is currently in the action stage of change. As such, his goal is to continue with weight loss efforts. He has agreed to keeping a food journal and adhering to recommended goals of 2000-2200 calories and 115 grams of protein.   Exercise goals: As is.  Behavioral modification strategies:  increasing lean protein intake, decreasing simple carbohydrates, decreasing eating out, no skipping meals and avoiding temptations.  Joseph Graves has agreed to follow-up with our clinic in 2 weeks. He was informed of the importance of frequent follow-up visits to maximize his success with intensive lifestyle modifications for his multiple health conditions.   Objective:   Blood pressure 105/70,  pulse 61, temperature 98.1 F (36.7 C), height 6\' 4"  (1.93 m), weight (!) 316 lb (143.3 kg), SpO2 98 %. Body mass index is 38.46 kg/m.  General: Cooperative, alert, well developed, in no acute distress. HEENT: Conjunctivae and lids unremarkable. Cardiovascular: Regular rhythm.  Lungs: Normal work of breathing. Neurologic: No focal deficits.   Lab Results  Component Value Date   CREATININE 0.90 02/14/2020   BUN 16 02/14/2020   NA 139 02/14/2020   K 4.3 02/14/2020   CL 101 02/14/2020   CO2 24 02/14/2020   Lab Results  Component Value Date   ALT 22 02/14/2020   AST 18 02/14/2020   ALKPHOS 84 02/14/2020   BILITOT 0.9 02/14/2020   Lab Results  Component Value Date   HGBA1C 5.3 02/14/2020   HGBA1C 5.8 (H) 10/19/2019   HGBA1C 5.5 06/07/2018   HGBA1C 5.5 04/27/2017   Lab Results  Component Value Date   INSULIN 14.8 02/14/2020   INSULIN 41.0 (H) 10/19/2019   Lab Results  Component Value Date   TSH 2.550 10/19/2019   Lab Results  Component Value Date   CHOL 109 02/14/2020   HDL 39 (L) 02/14/2020   LDLCALC 55 02/14/2020   TRIG 71 02/14/2020   CHOLHDL 3.6 06/07/2018   Lab Results  Component Value Date   WBC 6.1 06/07/2018   HGB 14.2 06/07/2018   HCT 42.4 06/07/2018   MCV 90 06/07/2018   PLT 311 06/07/2018   Attestation Statements:   Reviewed by clinician on day of visit: allergies, medications, problem list, medical history, surgical history, family history, social history, and previous encounter notes.  I, 06/09/2018, CMA, am acting as Insurance claims handler for Energy manager, DO.  I have reviewed the above documentation for accuracy and completeness, and I agree with the above. [employer], DO

## 2020-05-29 ENCOUNTER — Other Ambulatory Visit: Payer: Self-pay | Admitting: Physician Assistant

## 2020-05-29 DIAGNOSIS — I10 Essential (primary) hypertension: Secondary | ICD-10-CM

## 2020-05-30 ENCOUNTER — Encounter (INDEPENDENT_AMBULATORY_CARE_PROVIDER_SITE_OTHER): Payer: Self-pay | Admitting: Family Medicine

## 2020-05-30 ENCOUNTER — Ambulatory Visit (INDEPENDENT_AMBULATORY_CARE_PROVIDER_SITE_OTHER): Payer: 59 | Admitting: Family Medicine

## 2020-05-30 ENCOUNTER — Other Ambulatory Visit: Payer: Self-pay

## 2020-05-30 VITALS — BP 144/92 | HR 62 | Temp 97.6°F | Ht 76.0 in | Wt 328.0 lb

## 2020-05-30 DIAGNOSIS — R7303 Prediabetes: Secondary | ICD-10-CM | POA: Diagnosis not present

## 2020-05-30 DIAGNOSIS — Z9189 Other specified personal risk factors, not elsewhere classified: Secondary | ICD-10-CM | POA: Diagnosis not present

## 2020-05-30 DIAGNOSIS — Z6841 Body Mass Index (BMI) 40.0 and over, adult: Secondary | ICD-10-CM

## 2020-05-30 DIAGNOSIS — I10 Essential (primary) hypertension: Secondary | ICD-10-CM

## 2020-05-30 MED ORDER — LOSARTAN POTASSIUM 100 MG PO TABS: 100.0000 mg | ORAL_TABLET | Freq: Every day | ORAL | 0 refills | Status: DC

## 2020-05-30 MED ORDER — HYDROCHLOROTHIAZIDE 12.5 MG PO CAPS: 12.5000 mg | ORAL_CAPSULE | Freq: Every day | ORAL | 0 refills | Status: DC

## 2020-05-30 MED FILL — HYDROCHLOROTHIAZIDE 12.5 MG: 12.5 | 30 days supply | Qty: 30 | Fill #0

## 2020-05-30 MED FILL — LOSARTAN POTASSIUM 100 MG T: 100 | 30 days supply | Qty: 30 | Fill #0

## 2020-05-30 MED FILL — METFORMIN HCL 500 MG TABS: 500 | 30 days supply | Qty: 60 | Fill #0

## 2020-05-30 NOTE — Progress Notes (Signed)
Chief Complaint:   OBESITY Joseph Graves is here to discuss his progress with his obesity treatment plan along with follow-up of his obesity related diagnoses. Joseph Graves is on keeping a food journal and adhering to recommended goals of 2000-2200 calories and 115 grams of protein and states he is following his eating plan approximately 0% of the time. Joseph Graves states he is exercising for 0 minutes 0 times per week.  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  He has not followed the plan at all.  This morning he ate a sausage, egg, and cheese roll with OJ.  He is not tracking protein or calories.  He states he is taking Wellbutrin without issues, but not sure if one or two per day.    Subjective:   1. Prediabetes Joseph Graves has a diagnosis of prediabetes based on his elevated HgA1c and was informed this puts him at greater risk of developing diabetes. He continues to work on diet and exercise to decrease his risk of diabetes. He denies nausea or hypoglycemia.  At last office visit, he told me he was not taking his metformin, really.  He was given a prescription, but he never picked it up and only took one tablet daily for 4 days since his last office visit.  A1c was 5.3 around 3 months ago.  Lab Results  Component Value Date   HGBA1C 5.3 02/14/2020   Lab Results  Component Value Date   INSULIN 14.8 02/14/2020   INSULIN 41.0 (H) 10/19/2019   2. Hypertension, unspecified type Review: taking medications as instructed, no medication side effects noted, no chest pain on exertion, no dyspnea on exertion, no swelling of ankles.  He is not checking his blood pressure at home much.  Average once weekly and it runs 120s/70s-80s; not over  139/89.  He ran out of losartan and/or HCTZ, he is not sure.  Denies symptoms or concerns, though.  BP Readings from Last 3 Encounters:  05/30/20 (!) 144/92  05/09/20 105/70  04/18/20 103/68   3. At risk for heart disease Due to Joseph Graves's current state of health and medical condition(s), he is at a higher risk for heart disease.   This puts the patient at much greater risk to subsequently develop cardiopulmonary conditions that can significantly affect patient's quality of life in a negative manner as well.    Assessment/Plan:   1. Prediabetes Trejon will continue to work on weight loss, exercise, and decreasing simple carbohydrates to help decrease the risk of diabetes.  Restart metformin as written.  Need to take twice daily regularly.  He has a prescription, but never picked it up.  Medication counseling and diagnosis counseling done today to encourage medication adherence and compliance.   2. Hypertension, unspecified type Worsening.  Joseph Graves is working on healthy weight loss and exercise to improve blood pressure control. We will watch for signs of hypotension as he continues his lifestyle modifications.  Refills for losartan and HCTZ provided today, as per below, as a courtesy until he is seen by his PCP (he is out).  Get back on plan.  -Refill losartan (COZAAR) 100 MG tablet; Take 1 tablet (100 mg total) by mouth daily.  Dispense: 30 tablet; Refill: 0 -  Refill hydrochlorothiazide (MICROZIDE) 12.5 MG capsule; Take 1 capsule (12.5 mg total) by mouth daily.  Dispense: 30 capsule; Refill: 0  3. At risk for heart disease At least 15+ minutes was spent on counseling Joseph Graves about these concerns today and I stressed the importance of reversing risks factors of obesity, esp truncal and visceral fat, hypertension, hyperlipidemia, pre-diabetes.   Initial goal is to lose at least 5-10% of starting weight to help reduce these risk factors.   Counseling: Intensive lifestyle modifications discussed with  Joseph Graves as most appropriate first line treatment.  he will continue to work on diet, exercise and weight loss efforts.  We will continue to reassess these conditions on a fairly regular basis in an attempt to decrease patient's overall morbidity and mortality.  Evidence-based interventions for health behavior change were utilized today including the discussion of self monitoring techniques, problem-solving barriers and SMART goal setting techniques.    4. Class 3 severe obesity with serious comorbidity and body mass index (BMI) of 40.0 to 44.9 in adult, unspecified obesity type (HCC) Joseph Graves is currently in the action stage of change. As such, his goal is to continue with weight loss efforts. He has agreed to keeping a food journal and adhering to recommended goals of 2000-2200 calories and 115 grams of protein.   Get back on plan.  Restart taking metformin again (and blood pressure medications and Wellbutrin regularly).  He will call for a new PCP appointment or with old PCP for refills, etc.  Exercise goals: 10 minutes of walking per day (if 2 sessions, even better).  Behavioral modification strategies: decreasing liquid calories (no juice), no skipping meals, meal planning and cooking strategies, planning for success and keeping a strict food journal.  Joseph Graves has agreed to follow-up with our clinic in 2 weeks. He was informed of the importance of frequent follow-up visits to maximize his success with intensive lifestyle modifications for his multiple health conditions.   Objective:   Blood pressure (!) 144/92, pulse 62, temperature 97.6 F (36.4 C), height 6\' 4"  (1.93 m), weight (!) 328 lb (148.8 kg), SpO2 99 %. Body mass index is 39.93 kg/m.  General: Cooperative, alert, well developed, in no acute distress. HEENT: Conjunctivae and lids unremarkable. Cardiovascular: Regular rhythm.  Lungs: Normal work of breathing. Neurologic: No focal deficits.   Lab Results  Component Value Date    CREATININE 0.90 02/14/2020   BUN 16 02/14/2020   NA 139 02/14/2020   K 4.3 02/14/2020   CL 101 02/14/2020   CO2 24 02/14/2020   Lab Results  Component Value Date   ALT 22 02/14/2020   AST 18 02/14/2020   ALKPHOS 84 02/14/2020   BILITOT 0.9 02/14/2020   Lab Results  Component Value Date   HGBA1C 5.3 02/14/2020   HGBA1C 5.8 (H) 10/19/2019   HGBA1C 5.5 06/07/2018   HGBA1C 5.5 04/27/2017   Lab Results  Component Value Date   INSULIN 14.8 02/14/2020   INSULIN 41.0 (H) 10/19/2019   Lab Results  Component Value Date   TSH 2.550 10/19/2019   Lab Results  Component Value Date   CHOL 109 02/14/2020   HDL 39 (L) 02/14/2020   LDLCALC 55 02/14/2020   TRIG 71 02/14/2020   CHOLHDL 3.6 06/07/2018   Lab Results  Component Value Date   WBC 6.1 06/07/2018   HGB 14.2 06/07/2018   HCT 42.4 06/07/2018   MCV 90 06/07/2018   PLT 311 06/07/2018   Attestation Statements:   Reviewed by clinician on  day of visit: allergies, medications, problem list, medical history, surgical history, family history, social history, and previous encounter notes.  I, Insurance claims handler, CMA, am acting as Energy manager for [employer], DO.  I have reviewed the above documentation for accuracy and completeness, and I agree with the above. Thomasene Lot, DO

## 2020-06-14 DIAGNOSIS — G4733 Obstructive sleep apnea (adult) (pediatric): Secondary | ICD-10-CM | POA: Diagnosis not present

## 2020-06-20 ENCOUNTER — Other Ambulatory Visit (INDEPENDENT_AMBULATORY_CARE_PROVIDER_SITE_OTHER): Payer: Self-pay | Admitting: Family Medicine

## 2020-06-20 ENCOUNTER — Other Ambulatory Visit: Payer: Self-pay

## 2020-06-20 ENCOUNTER — Encounter (INDEPENDENT_AMBULATORY_CARE_PROVIDER_SITE_OTHER): Payer: Self-pay | Admitting: Family Medicine

## 2020-06-20 ENCOUNTER — Ambulatory Visit (INDEPENDENT_AMBULATORY_CARE_PROVIDER_SITE_OTHER): Payer: 59 | Admitting: Family Medicine

## 2020-06-20 VITALS — BP 107/69 | HR 57 | Temp 98.1°F | Ht 76.0 in | Wt 326.0 lb

## 2020-06-20 DIAGNOSIS — Z6839 Body mass index (BMI) 39.0-39.9, adult: Secondary | ICD-10-CM

## 2020-06-20 DIAGNOSIS — R7989 Other specified abnormal findings of blood chemistry: Secondary | ICD-10-CM | POA: Diagnosis not present

## 2020-06-20 DIAGNOSIS — I10 Essential (primary) hypertension: Secondary | ICD-10-CM

## 2020-06-20 DIAGNOSIS — R7303 Prediabetes: Secondary | ICD-10-CM | POA: Diagnosis not present

## 2020-06-20 DIAGNOSIS — Z9189 Other specified personal risk factors, not elsewhere classified: Secondary | ICD-10-CM

## 2020-06-20 MED ORDER — METFORMIN HCL 500 MG PO TABS: 500.0000 mg | ORAL_TABLET | Freq: Two times a day (BID) | ORAL | 0 refills | Status: DC

## 2020-06-21 NOTE — Progress Notes (Signed)
Chief Complaint:   OBESITY Joseph Graves is here to discuss his progress with his obesity treatment plan along with follow-up of his obesity related diagnoses. Joseph Graves is on keeping a food journal and adhering to recommended goals of 2000-2200 calories and 115 grams of protein and states he is following his eating plan approximately 50% of the time. Joseph Graves states he is walking for 10 minutes 5 times per week.  Today's visit was #: 14 Starting weight: 364 lbs Starting date: 10/19/2019 Today's weight: 326 lbs Today's date: 06/20/2020 Total lbs lost to date: 38 lbs Total lbs lost since last in-office visit: 2 lbs  Interim History: Joseph Graves says that since his last office visit, he has increased his protein and is also tracking some, but is not back to where he knows he should be.  Also, he has been more consistent with metformin.  When he started the program, 09/2019, he weighed 364 pounds and today he weighs 326 pounds.  Next office visit he will bring in his journal log for me and do it QD.    Subjective:   1. Essential hypertension Review: taking medications as instructed, no medication side effects noted, no chest pain on exertion, no dyspnea on exertion, no swelling of ankles.  Blood pressure at home is 117/80s.  Last blood pressure was 144/92.  Denies symptoms or concerns.  He got back on his blood pressure medications daily since last visit.  HCTZ and losartan.  BP Readings from Last 3 Encounters:  06/20/20 107/69  05/30/20 (!) 144/92  05/09/20 105/70   2. Prediabetes Lamond has a diagnosis of prediabetes based on his elevated HgA1c and was informed this puts him at greater risk of developing diabetes. He continues to work on diet and exercise to decrease his risk of diabetes. He denies nausea or hypoglycemia. At last office visit he restarted metformin.  Tolerating well.  A1c was 5.8 in 09/2019.  Lab Results  Component Value Date   HGBA1C 5.3 02/14/2020   Lab Results  Component Value  Date   INSULIN 14.8 02/14/2020   INSULIN 41.0 (H) 10/19/2019   3. Low testosterone Testosterone level was 259 on 10/19/2019.  We discussed treatment then and recommended weight loss at that time prior to medications.  He is still fatigued/ tired.  4. At risk for impaired metabolic function   Assessment/Plan:   1. Essential hypertension Dak is working on healthy weight loss and exercise to improve blood pressure control. We will watch for signs of hypotension as he continues his lifestyle modifications. Continue medications.  Blood pressure at goal.  Continue weight loss, low salt meal plan recommended.  2. Prediabetes Joseph Graves will continue to work on weight loss, exercise, and decreasing simple carbohydrates to help decrease the risk of diabetes.  Recheck FI and A1c, CMP after 2 months or so on medication consistently, especially secondary to increase ALT in the past as well.  -Refill metFORMIN (GLUCOPHAGE) 500 MG tablet; Take 1 tablet (500 mg total) by mouth 2 (two) times daily with a meal.  Dispense: 60 tablet; Refill: 0  3. Low testosterone Recheck testosterone levels in around 2-3 months from now along with other labs.  4. At risk for impaired metabolic function Due to Zyair's current state of health and medical condition(s), they are at a significantly higher risk for impaired metabolic function.   This further also puts the patient at much greater risk to also subsequently develop cardiopulmonary conditions that can negatively affect patient's quality of life  as well.  At least13 minutes was spent on counseling Baylin about these concerns today and I stressed the importance of reversing these risks factors.   Initial goal is to lose at least 5-10% of starting weight to help reduce risk factors.   Counseling: Intensive lifestyle modifications discussed with Joseph Graves as most appropriate first line treatment.  he will continue to work on diet, exercise and weight loss efforts.  We will  continue to reassess these conditions on a fairly regular basis in an attempt to decrease patient's overall morbidity and mortality  5. Class 2 severe obesity with serious comorbidity and body mass index (BMI) of 39.0 to 39.9 in adult, unspecified obesity type (HCC)  Joseph Graves is currently in the action stage of change. As such, his goal is to continue with weight loss efforts. He has agreed to keeping a food journal and adhering to recommended goals of 2000-2200 calories and 115-120+ grams of protein.   Exercise goals: As is.  Behavioral modification strategies: increasing lean protein intake, meal planning and cooking strategies, planning for success and keeping a strict food journal.  Joseph Graves has agreed to follow-up with our clinic in 2-3 weeks. He was informed of the importance of frequent follow-up visits to maximize his success with intensive lifestyle modifications for his multiple health conditions.   Objective:   Blood pressure 107/69, pulse (!) 57, temperature 98.1 F (36.7 C), height 6\' 4"  (1.93 m), weight (!) 326 lb (147.9 kg), SpO2 99 %. Body mass index is 39.68 kg/m.  General: Cooperative, alert, well developed, in no acute distress. HEENT: Conjunctivae and lids unremarkable. Cardiovascular: Regular rhythm.  Lungs: Normal work of breathing. Neurologic: No focal deficits.   Lab Results  Component Value Date   CREATININE 0.90 02/14/2020   BUN 16 02/14/2020   NA 139 02/14/2020   K 4.3 02/14/2020   CL 101 02/14/2020   CO2 24 02/14/2020   Lab Results  Component Value Date   ALT 22 02/14/2020   AST 18 02/14/2020   ALKPHOS 84 02/14/2020   BILITOT 0.9 02/14/2020   Lab Results  Component Value Date   HGBA1C 5.3 02/14/2020   HGBA1C 5.8 (H) 10/19/2019   HGBA1C 5.5 06/07/2018   HGBA1C 5.5 04/27/2017   Lab Results  Component Value Date   INSULIN 14.8 02/14/2020   INSULIN 41.0 (H) 10/19/2019   Lab Results  Component Value Date   TSH 2.550 10/19/2019   Lab Results    Component Value Date   CHOL 109 02/14/2020   HDL 39 (L) 02/14/2020   LDLCALC 55 02/14/2020   TRIG 71 02/14/2020   CHOLHDL 3.6 06/07/2018   Lab Results  Component Value Date   WBC 6.1 06/07/2018   HGB 14.2 06/07/2018   HCT 42.4 06/07/2018   MCV 90 06/07/2018   PLT 311 06/07/2018   Attestation Statements:   Reviewed by clinician on day of visit: allergies, medications, problem list, medical history, surgical history, family history, social history, and previous encounter notes.   I, 06/09/2018, CMA, am acting as Insurance claims handler for Energy manager, DO.  I have reviewed the above documentation for accuracy and completeness, and I agree with the above. [employer], DO

## 2020-06-23 MED FILL — METFORMIN HCL 500 MG TABS: 500 | 30 days supply | Qty: 60 | Fill #0

## 2020-06-28 ENCOUNTER — Other Ambulatory Visit: Payer: Self-pay | Admitting: Physician Assistant

## 2020-06-28 ENCOUNTER — Other Ambulatory Visit (INDEPENDENT_AMBULATORY_CARE_PROVIDER_SITE_OTHER): Payer: Self-pay | Admitting: Family Medicine

## 2020-06-28 DIAGNOSIS — I10 Essential (primary) hypertension: Secondary | ICD-10-CM

## 2020-06-28 DIAGNOSIS — F3289 Other specified depressive episodes: Secondary | ICD-10-CM

## 2020-06-28 MED FILL — buPROPion HCL ER (XL) 150 M: 150 | 30 days supply | Qty: 60 | Fill #0

## 2020-06-28 MED FILL — HYDROCHLOROTHIAZIDE 12.5 MG: 12.5 | 30 days supply | Qty: 30 | Fill #1

## 2020-06-28 NOTE — Telephone Encounter (Signed)
Please call patient to schedule apt for further refill. Patient given 30 day supply today. AS, CMA

## 2020-07-03 ENCOUNTER — Encounter: Payer: 59 | Admitting: Physician Assistant

## 2020-07-03 ENCOUNTER — Other Ambulatory Visit (INDEPENDENT_AMBULATORY_CARE_PROVIDER_SITE_OTHER): Payer: Self-pay | Admitting: Family Medicine

## 2020-07-03 ENCOUNTER — Other Ambulatory Visit: Payer: Self-pay | Admitting: Physician Assistant

## 2020-07-03 DIAGNOSIS — I10 Essential (primary) hypertension: Secondary | ICD-10-CM

## 2020-07-03 DIAGNOSIS — F3289 Other specified depressive episodes: Secondary | ICD-10-CM

## 2020-07-03 DIAGNOSIS — J3089 Other allergic rhinitis: Secondary | ICD-10-CM

## 2020-07-04 MED ORDER — BUPROPION HCL ER (XL) 150 MG PO TB24: 300.0000 mg | ORAL_TABLET | Freq: Every day | ORAL | 0 refills | Status: DC

## 2020-07-05 NOTE — Telephone Encounter (Signed)
Please review

## 2020-07-10 ENCOUNTER — Other Ambulatory Visit (INDEPENDENT_AMBULATORY_CARE_PROVIDER_SITE_OTHER): Payer: Self-pay | Admitting: Family Medicine

## 2020-07-11 ENCOUNTER — Other Ambulatory Visit: Payer: Self-pay

## 2020-07-11 ENCOUNTER — Ambulatory Visit (INDEPENDENT_AMBULATORY_CARE_PROVIDER_SITE_OTHER): Payer: 59 | Admitting: Family Medicine

## 2020-07-11 ENCOUNTER — Other Ambulatory Visit (INDEPENDENT_AMBULATORY_CARE_PROVIDER_SITE_OTHER): Payer: Self-pay | Admitting: Family Medicine

## 2020-07-11 ENCOUNTER — Encounter (INDEPENDENT_AMBULATORY_CARE_PROVIDER_SITE_OTHER): Payer: Self-pay | Admitting: Family Medicine

## 2020-07-11 VITALS — BP 111/78 | HR 64 | Temp 98.2°F | Ht 76.0 in | Wt 329.0 lb

## 2020-07-11 DIAGNOSIS — R7303 Prediabetes: Secondary | ICD-10-CM

## 2020-07-11 DIAGNOSIS — Z9189 Other specified personal risk factors, not elsewhere classified: Secondary | ICD-10-CM

## 2020-07-11 DIAGNOSIS — I10 Essential (primary) hypertension: Secondary | ICD-10-CM | POA: Diagnosis not present

## 2020-07-11 DIAGNOSIS — K760 Fatty (change of) liver, not elsewhere classified: Secondary | ICD-10-CM | POA: Diagnosis not present

## 2020-07-11 DIAGNOSIS — E786 Lipoprotein deficiency: Secondary | ICD-10-CM

## 2020-07-11 DIAGNOSIS — Z6841 Body Mass Index (BMI) 40.0 and over, adult: Secondary | ICD-10-CM | POA: Diagnosis not present

## 2020-07-11 DIAGNOSIS — E559 Vitamin D deficiency, unspecified: Secondary | ICD-10-CM

## 2020-07-11 DIAGNOSIS — R7989 Other specified abnormal findings of blood chemistry: Secondary | ICD-10-CM | POA: Diagnosis not present

## 2020-07-11 MED ORDER — HYDROCHLOROTHIAZIDE 12.5 MG PO CAPS: 12.5000 mg | ORAL_CAPSULE | Freq: Every day | ORAL | 0 refills | Status: DC

## 2020-07-11 MED ORDER — METFORMIN HCL 500 MG PO TABS: 500.0000 mg | ORAL_TABLET | Freq: Two times a day (BID) | ORAL | 0 refills | Status: DC

## 2020-07-11 MED FILL — LOSARTAN POTASSIUM 100 MG T: 100 | 30 days supply | Qty: 30 | Fill #0

## 2020-07-16 ENCOUNTER — Other Ambulatory Visit (INDEPENDENT_AMBULATORY_CARE_PROVIDER_SITE_OTHER): Payer: Self-pay | Admitting: Family Medicine

## 2020-07-16 DIAGNOSIS — J3089 Other allergic rhinitis: Secondary | ICD-10-CM

## 2020-07-17 NOTE — Progress Notes (Signed)
Chief Complaint:   OBESITY Joseph Graves is here to discuss his progress with his obesity treatment plan along with follow-up of his obesity related diagnoses. Joseph Graves is on keeping a food journal and adhering to recommended goals of 2000-2200 calories and 115 grams of protein and states he is following his eating plan approximately 100% of the time. Joseph Graves states he is walking or hiking for 60 minutes 1-2 times per week.  Today's visit was #: 15 Starting weight: 364 lbs Starting date: 10/19/2019 Today's weight: 329 lbs Today's date: 07/11/2020 Total lbs lost to date: 35 lbs Total lbs lost since last in-office visit: +3 lbs Total weight loss percentage to date: -9.62%  Interim History: Joseph Graves's goal at last office visit was to track everyday.  He has done so.  He has been hitting his protein goal, but he has been over his calorie goal at around 2500 calories per day.  He has been eating more fatty meats as well along with more carbs/potatoes, etc.  Assessment/Plan:   1. Essential hypertension Review: taking medications as instructed, no medication side effects noted, no chest pain on exertion, no dyspnea on exertion, no swelling of ankles.  He is taking HCTZ 12.5 mg daily and Cozaar 100 mg daily for blood pressure control.  Plan:  Refill HCTZ and losartan today, as per below.  Blood pressure is at goal today.  Will check CMP at next office visit.  BP Readings from Last 3 Encounters:  07/11/20 111/78  06/20/20 107/69  05/30/20 (!) 144/92   -Refill hydrochlorothiazide (MICROZIDE) 12.5 MG capsule; Take 1 capsule (12.5 mg total) by mouth daily.  Dispense: 30 capsule; Refill: 0 - Comprehensive metabolic panel - Lipid Panel With LDL/HDL Ratio  2. Prediabetes Joseph Graves has a diagnosis of prediabetes based on his elevated HgA1c and was informed this puts him at greater risk of developing diabetes. He continues to work on diet and exercise to decrease his risk of diabetes. He denies nausea or  hypoglycemia.  Plan:  Refill metformin, as per below.  Handout on metformin provided.  Will check A1c and FI at next office visit.  Lab Results  Component Value Date   HGBA1C 5.3 02/14/2020   Lab Results  Component Value Date   INSULIN 14.8 02/14/2020   INSULIN 41.0 (H) 10/19/2019   -Refill metFORMIN (GLUCOPHAGE) 500 MG tablet; Take 1 tablet (500 mg total) by mouth 2 (two) times daily with a meal.  Dispense: 60 tablet; Refill: 0 - Hemoglobin A1c - Insulin, random  3. Low HDL (under 40) Joseph Graves has hyperlipidemia and has been trying to improve his cholesterol levels with intensive lifestyle modification including a low saturated fat diet, exercise and weight loss. He denies any chest pain, claudication or myalgias.  Lab Results  Component Value Date   ALT 22 02/14/2020   AST 18 02/14/2020   ALKPHOS 84 02/14/2020   BILITOT 0.9 02/14/2020   Lab Results  Component Value Date   CHOL 109 02/14/2020   HDL 39 (L) 02/14/2020   LDLCALC 55 02/14/2020   TRIG 71 02/14/2020   CHOLHDL 3.6 06/07/2018   4. Vitamin D deficiency Joseph Graves has a history of Vitamin D deficiency with resultant generalized fatigue as his primary symptom.  he is taking vitamin D 5,000 IU daily for this deficiency and tolerating it well without side-effect.  Most recent Vitamin D lab reviewed-  level: 72.6 on 02/14/2020.  Plan:   - Discussed importance of vitamin D (as well as calcium)  to their health and wellbeing.   - We reviewed possible symptoms of low Vitamin D including low energy, depressed mood, muscle aches, joint aches, osteoporosis, etc.  - We discussed that low Vitamin D levels may be linked to an increased risk of cardiovascular events, and even increased risk of cancers, such as colon and breast.   - Educated pt that weight loss will likely improve availability of vitamin D, thus encouraged Joseph Graves to continue with meal plan and their weight loss efforts to further improve this condition.  - I  recommend pt take daily OTC vit D 5,000 IU, which pt agrees to after discussion of the risks and benefits of this medication.      - Informed patient this may be a lifelong thing, and he was encouraged to continue to take the medicine until told otherwise.  We will need to monitor levels regularly (every 3-4 mo on average) to keep levels within normal limits.   - All pt's questions and concerns regarding this condition addressed.  - VITAMIN D 25 Hydroxy (Vit-D Deficiency, Fractures) at next office visit.  5. Low testosterone Will check testosterone level at next office visit along with TSH, free T4 and T3.  - T3 - T4 - TSH - Testosterone  6. History of Hepatosteatosis Joseph Graves continues to lose weight.    Plan:  Will check CMP at next office visit.  Lab Results  Component Value Date   ALT 22 02/14/2020   AST 18 02/14/2020   ALKPHOS 84 02/14/2020   BILITOT 0.9 02/14/2020   7. At risk for heart disease Due to Joseph Graves's current state of health and medical condition(s), he is at a higher risk for heart disease.   This puts the patient at much greater risk to subsequently develop cardiopulmonary conditions that can significantly affect patient's quality of life in a negative manner as well.    At least 12 minutes was spent on counseling Joseph Graves about these concerns today and I stressed the importance of reversing risks factors of obesity, esp truncal and visceral fat, hypertension, hyperlipidemia, pre-diabetes.   Initial goal is to lose at least 5-10% of starting weight to help reduce these risk factors.   Counseling: Intensive lifestyle modifications discussed with Joseph Graves as most appropriate first line treatment.  he will continue to work on diet, exercise and weight loss efforts.  We will continue to reassess these conditions on a fairly regular basis in an attempt to decrease patient's overall morbidity and mortality.  Evidence-based interventions for health behavior change were utilized  today including the discussion of self monitoring techniques, problem-solving barriers and SMART goal setting techniques.  Specifically regarding patient's less desirable eating habits and patterns, we employed the technique of small changes when Joseph Graves has not been able to fully commit to his prudent nutritional plan.  8. Class 3 severe obesity with serious comorbidity and body mass index (BMI) of 40.0 to 44.9 in adult, unspecified obesity type (HCC)  Joseph Graves is currently in the action stage of change. As such, his goal is to continue with weight loss efforts. He has agreed to the Category 4 Plan.   Exercise goals: For substantial health benefits, adults should do at least 150 minutes (2 hours and 30 minutes) a week of moderate-intensity, or 75 minutes (1 hour and 15 minutes) a week of vigorous-intensity aerobic physical activity, or an equivalent combination of moderate- and vigorous-intensity aerobic activity. Aerobic activity should be performed in episodes of at least 10 minutes, and preferably, it should  be spread throughout the week.  Behavioral modification strategies: increasing lean protein intake, decreasing simple carbohydrates, meal planning and cooking strategies, keeping healthy foods in the home and planning for success.  Joseph Graves has agreed to follow-up with our clinic in 3-4 weeks, fasting. He was informed of the importance of frequent follow-up visits to maximize his success with intensive lifestyle modifications for his multiple health conditions.   Joseph Graves was informed we would discuss his lab results at his next visit unless there is a critical issue that needs to be addressed sooner. Joseph Graves agreed to keep his next visit at the agreed upon time to discuss these results.  Objective:   Blood pressure 111/78, pulse 64, temperature 98.2 F (36.8 C), temperature source Oral, height 6\' 4"  (1.93 m), weight (!) 329 lb (149.2 kg), SpO2 99 %. Body mass index is 40.05 kg/m.  General:  Cooperative, alert, well developed, in no acute distress. HEENT: Conjunctivae and lids unremarkable. Cardiovascular: Regular rhythm.  Lungs: Normal work of breathing. Neurologic: No focal deficits.   Lab Results  Component Value Date   CREATININE 0.90 02/14/2020   BUN 16 02/14/2020   NA 139 02/14/2020   K 4.3 02/14/2020   CL 101 02/14/2020   CO2 24 02/14/2020   Lab Results  Component Value Date   ALT 22 02/14/2020   AST 18 02/14/2020   ALKPHOS 84 02/14/2020   BILITOT 0.9 02/14/2020   Lab Results  Component Value Date   HGBA1C 5.3 02/14/2020   HGBA1C 5.8 (H) 10/19/2019   HGBA1C 5.5 06/07/2018   HGBA1C 5.5 04/27/2017   Lab Results  Component Value Date   INSULIN 14.8 02/14/2020   INSULIN 41.0 (H) 10/19/2019   Lab Results  Component Value Date   TSH 2.550 10/19/2019   Lab Results  Component Value Date   CHOL 109 02/14/2020   HDL 39 (L) 02/14/2020   LDLCALC 55 02/14/2020   TRIG 71 02/14/2020   CHOLHDL 3.6 06/07/2018   Lab Results  Component Value Date   WBC 6.1 06/07/2018   HGB 14.2 06/07/2018   HCT 42.4 06/07/2018   MCV 90 06/07/2018   PLT 311 06/07/2018   Attestation Statements:   Reviewed by clinician on day of visit: allergies, medications, problem list, medical history, surgical history, family history, social history, and previous encounter notes.  I, 06/09/2018, CMA, am acting as Insurance claims handler for Energy manager, DO.  I have reviewed the above documentation for accuracy and completeness, and I agree with the above. -  *[employer], D.O.  The 21st Century Cures Act was signed into law in 2016 which includes the topic of electronic health records.  This provides immediate access to information in MyChart.  This includes consultation notes, operative notes, office notes, lab results and pathology reports.  If you have any questions about what you read please let 2017 know at your next visit so we can discuss your concerns and take corrective  action if need be.  We are right here with you.

## 2020-07-18 MED FILL — METFORMIN HCL 500 MG TABS: 500 | 30 days supply | Qty: 60 | Fill #0

## 2020-07-18 MED FILL — MONTELUKAST SOD 10 MG TAB: 10 | 30 days supply | Qty: 30 | Fill #0

## 2020-08-01 ENCOUNTER — Encounter (INDEPENDENT_AMBULATORY_CARE_PROVIDER_SITE_OTHER): Payer: Self-pay | Admitting: Family Medicine

## 2020-08-01 ENCOUNTER — Ambulatory Visit (INDEPENDENT_AMBULATORY_CARE_PROVIDER_SITE_OTHER): Payer: 59 | Admitting: Family Medicine

## 2020-08-01 ENCOUNTER — Other Ambulatory Visit (INDEPENDENT_AMBULATORY_CARE_PROVIDER_SITE_OTHER): Payer: Self-pay | Admitting: Family Medicine

## 2020-08-01 ENCOUNTER — Other Ambulatory Visit: Payer: Self-pay

## 2020-08-01 VITALS — BP 109/73 | HR 59 | Temp 97.5°F | Ht 76.0 in | Wt 334.0 lb

## 2020-08-01 DIAGNOSIS — E559 Vitamin D deficiency, unspecified: Secondary | ICD-10-CM

## 2020-08-01 DIAGNOSIS — Z9189 Other specified personal risk factors, not elsewhere classified: Secondary | ICD-10-CM | POA: Diagnosis not present

## 2020-08-01 DIAGNOSIS — R7989 Other specified abnormal findings of blood chemistry: Secondary | ICD-10-CM | POA: Insufficient documentation

## 2020-08-01 DIAGNOSIS — I10 Essential (primary) hypertension: Secondary | ICD-10-CM | POA: Diagnosis not present

## 2020-08-01 DIAGNOSIS — E786 Lipoprotein deficiency: Secondary | ICD-10-CM | POA: Diagnosis not present

## 2020-08-01 DIAGNOSIS — F3289 Other specified depressive episodes: Secondary | ICD-10-CM | POA: Diagnosis not present

## 2020-08-01 DIAGNOSIS — R7303 Prediabetes: Secondary | ICD-10-CM | POA: Diagnosis not present

## 2020-08-01 DIAGNOSIS — K760 Fatty (change of) liver, not elsewhere classified: Secondary | ICD-10-CM | POA: Insufficient documentation

## 2020-08-01 DIAGNOSIS — Z6841 Body Mass Index (BMI) 40.0 and over, adult: Secondary | ICD-10-CM

## 2020-08-01 DIAGNOSIS — F4323 Adjustment disorder with mixed anxiety and depressed mood: Secondary | ICD-10-CM | POA: Insufficient documentation

## 2020-08-01 MED ORDER — HYDROCHLOROTHIAZIDE 12.5 MG PO CAPS: 12.5000 mg | ORAL_CAPSULE | Freq: Every day | ORAL | 0 refills | Status: DC

## 2020-08-01 MED ORDER — BUPROPION HCL ER (XL) 150 MG PO TB24: 300.0000 mg | ORAL_TABLET | Freq: Every day | ORAL | 0 refills | Status: DC

## 2020-08-01 MED FILL — HYDROCHLOROTHIAZIDE 12.5 MG: 12.5 | 30 days supply | Qty: 30 | Fill #0

## 2020-08-01 MED FILL — buPROPion HCL ER (XL) 150 M: 150 | 15 days supply | Qty: 30 | Fill #0

## 2020-08-02 ENCOUNTER — Encounter (HOSPITAL_COMMUNITY): Payer: Self-pay | Admitting: Emergency Medicine

## 2020-08-02 ENCOUNTER — Ambulatory Visit (HOSPITAL_COMMUNITY)
Admission: EM | Admit: 2020-08-02 | Discharge: 2020-08-02 | Disposition: A | Discharge status: Home / Self Care | Payer: 59 | Attending: Family Medicine | Admitting: Family Medicine

## 2020-08-02 ENCOUNTER — Other Ambulatory Visit: Payer: Self-pay

## 2020-08-02 ENCOUNTER — Other Ambulatory Visit (HOSPITAL_COMMUNITY): Payer: Self-pay | Admitting: Family Medicine

## 2020-08-02 DIAGNOSIS — H1032 Unspecified acute conjunctivitis, left eye: Secondary | ICD-10-CM | POA: Diagnosis not present

## 2020-08-02 LAB — COMPREHENSIVE METABOLIC PANEL
ALT: 22 IU/L (ref 0–44)
AST: 17 IU/L (ref 0–40)
Albumin/Globulin Ratio: 1.5 (ref 1.2–2.2)
Albumin: 4.3 g/dL (ref 4.0–5.0)
Alkaline Phosphatase: 73 IU/L (ref 44–121)
BUN/Creatinine Ratio: 17 (ref 9–20)
BUN: 16 mg/dL (ref 6–24)
Bilirubin Total: 0.6 mg/dL (ref 0.0–1.2)
CO2: 25 mmol/L (ref 20–29)
Calcium: 9.8 mg/dL (ref 8.7–10.2)
Chloride: 103 mmol/L (ref 96–106)
Creatinine, Ser: 0.96 mg/dL (ref 0.76–1.27)
GFR calc Af Amer: 106 mL/min/{1.73_m2} (ref >59)
GFR calc non Af Amer: 92 mL/min/{1.73_m2} (ref >59)
Globulin, Total: 2.9 g/dL (ref 1.5–4.5)
Glucose: 83 mg/dL (ref 65–99)
Potassium: 4.4 mmol/L (ref 3.5–5.2)
Sodium: 141 mmol/L (ref 134–144)
Total Protein: 7.2 g/dL (ref 6.0–8.5)

## 2020-08-02 LAB — LIPID PANEL WITH LDL/HDL RATIO
Cholesterol, Total: 131 mg/dL (ref 100–199)
HDL: 52 mg/dL (ref >39)
LDL Chol Calc (NIH): 63 mg/dL (ref 0–99)
LDL/HDL Ratio: 1.2 ratio (ref 0.0–3.6)
Triglycerides: 83 mg/dL (ref 0–149)
VLDL Cholesterol Cal: 16 mg/dL (ref 5–40)

## 2020-08-02 LAB — T3: T3, Total: 98 ng/dL (ref 71–180)

## 2020-08-02 LAB — TSH: TSH: 2.13 u[IU]/mL (ref 0.450–4.500)

## 2020-08-02 LAB — HEMOGLOBIN A1C
Est. average glucose Bld gHb Est-mCnc: 114 mg/dL
Hgb A1c MFr Bld: 5.6 % (ref 4.8–5.6)

## 2020-08-02 LAB — INSULIN, RANDOM: INSULIN: 31 u[IU]/mL — ABNORMAL HIGH (ref 2.6–24.9)

## 2020-08-02 LAB — VITAMIN D 25 HYDROXY (VIT D DEFICIENCY, FRACTURES): Vit D, 25-Hydroxy: 56.1 ng/mL (ref 30.0–100.0)

## 2020-08-02 LAB — T4, FREE: Free T4: 1.19 ng/dL (ref 0.82–1.77)

## 2020-08-02 MED ORDER — PIMECROLIMUS 1 % EX CREA: TOPICAL_CREAM | Freq: Two times a day (BID) | CUTANEOUS | 0 refills | Status: DC

## 2020-08-02 MED ORDER — ERYTHROMYCIN 5 MG/GM OP OINT: TOPICAL_OINTMENT | OPHTHALMIC | 0 refills | Status: DC

## 2020-08-02 MED FILL — PIMECROLIMUS 1 % CREA: 1 | 15 days supply | Qty: 30 | Fill #0 | Status: TO

## 2020-08-02 MED FILL — ERYTHROMYCIN EYE OINTMENT: 5 | 5 days supply | Qty: 4 | Fill #0

## 2020-08-02 MED FILL — PIMECROLIMUS 1 % CREA: 1 | 15 days supply | Qty: 30 | Fill #0

## 2020-08-02 NOTE — ED Triage Notes (Signed)
Pt c/o left eye irritation onset Saturday. Pt states he believes the wind blew something into it. Pt states eye is very itchy and it's red and swollen. Pt states he has been using saline drops and eye wash with no relief.

## 2020-08-02 NOTE — Progress Notes (Signed)
Chief Complaint:   OBESITY Joseph Graves is here to discuss his progress with his obesity treatment plan along with follow-up of his obesity related diagnoses. Joseph Graves is on the Category 4 Plan and states he is following his eating plan approximately 67% of the time. Joseph Graves states he is going to the gym for 30 minutes 2 times per week.  Today's visit was #: 44 Starting weight: 364 lbs Starting date: 10/19/2019 Today's weight: 334 lbs Today's date: 08/01/2020 Total lbs lost to date: 30 lbs Total lbs lost since last in-office visit: 5 lbs Total weight loss percentage to date: -8.24%  Interim History: Joseph Graves says that this past Saturday he had a large anniversary party with friends.  "My allergies are Jacked-up and when they are I eat."  Had ribs, cake, mac & cheese, baked beans, and a half a pint of whiskey.  He has not been diligent with exercise.  Plan:  Obtain PCP!  Will need care of chronic medical issues.  Assessment/Plan:   1. Essential hypertension Review: taking medications as instructed, no medication side effects noted, no chest pain on exertion, no dyspnea on exertion, no swelling of ankles.  He is not checking his blood pressure at home.  No symptoms or concerns.    Plan:  CMP and FLP today.  Will refill HCTZ, as per below.  BP Readings from Last 3 Encounters:  08/06/20 109/72  08/02/20 130/89  08/01/20 109/73   -Refill hydrochlorothiazide (MICROZIDE) 12.5 MG capsule; Take 1 capsule (12.5 mg total) by mouth daily.  Dispense: 30 capsule; Refill: 0 - T4, free  2. Prediabetes Joseph Graves has a diagnosis of prediabetes based on his elevated HgA1c and was informed this puts him at greater risk of developing diabetes. He continues to work on diet and exercise to decrease his risk of diabetes. He denies nausea or hypoglycemia.  He takes his metformin 1 or 2 times daily.  Tolerating okay.  Plan:  Continue metformin.  Check A1c, fasting insulin, CMP.  Lab Results  Component Value  Date   HGBA1C 5.6 08/01/2020   Lab Results  Component Value Date   INSULIN 31.0 (H) 08/01/2020   INSULIN 14.8 02/14/2020   INSULIN 41.0 (H) 10/19/2019   - T4, free  3. Nonalcoholic hepatosteatosis He denies abdominal pain or jaundice and has never been told of any liver problems in the past. He denies excessive alcohol intake.  Plan:  Will check CMP today.  Lab Results  Component Value Date   ALT 22 08/01/2020   AST 17 08/01/2020   ALKPHOS 73 08/01/2020   BILITOT 0.6 08/01/2020   4. Vitamin D deficiency Joseph Graves's Vitamin D level was 72.6 on 02/14/2020. He is currently taking OTC vitamin D 5,000 IU each day. He denies nausea, vomiting or muscle weakness.  Plan:  Check vitamin D level today.  5. Low testosterone in male Will check testosterone level today as well as TSH, T3, free T4.  6. Low HDL (under 40) Joseph Graves has hyperlipidemia and has been trying to improve his cholesterol levels with intensive lifestyle modification including a low saturated fat diet, exercise and weight loss. He denies any chest pain, claudication or myalgias.  Check FLP, CMP today.  Lab Results  Component Value Date   ALT 22 08/01/2020   AST 17 08/01/2020   ALKPHOS 73 08/01/2020   BILITOT 0.6 08/01/2020   Lab Results  Component Value Date   CHOL 131 08/01/2020   HDL 52 08/01/2020   LDLCALC 63 08/01/2020  TRIG 83 08/01/2020   CHOLHDL 3.6 06/07/2018   7. Other depression, with emotional eatimg Mood is stable.  No concerns except under a lot of stress since June/July when weight was 308.  It has been up since.  EAP every 2 weeks of counseling.  Plan:  Refill Wellbutrin, as per below.  Gave him information on intuitive eating, continue counseling, and add exercise for 30 minutes per day 5 days per week.  -Refill buPROPion (WELLBUTRIN XL) 150 MG 24 hr tablet; Take 2 tablets (300 mg total) by mouth daily. **NEEDS APT FOR FURTHER REFILLS**  Dispense: 30 tablet; Refill: 0 - T4, free  8. At risk  for impaired metabolic function Due to Joseph Graves's current state of health and medical condition(s), they are at a significantly higher risk for impaired metabolic function.  This further also puts the patient at much greater risk to also subsequently develop cardiopulmonary conditions that can negatively affect patient's quality of life as well.  At least 15 minutes was spent on counseling Joseph Graves about these concerns today and I stressed the importance of reversing these risks factors.  Initial goal is to lose at least 5-10% of starting weight to help reduce risk factors.  Counseling: Intensive lifestyle modifications discussed with Joseph Graves as most appropriate first line treatment.  he will continue to work on diet, exercise and weight loss efforts.  We will continue to reassess these conditions on a fairly regular basis in an attempt to decrease patient's overall morbidity and mortality.  9. Class 3 severe obesity with serious comorbidity and body mass index (BMI) of 40.0 to 44.9 in adult, unspecified obesity type (HCC)  Joseph Graves is currently in the action stage of change. As such, his goal is to continue with weight loss efforts. He has agreed to the Category 4 Plan.   Exercise goals: For substantial health benefits, adults should do at least 150 minutes (2 hours and 30 minutes) a week of moderate-intensity, or 75 minutes (1 hour and 15 minutes) a week of vigorous-intensity aerobic physical activity, or an equivalent combination of moderate- and vigorous-intensity aerobic activity. Aerobic activity should be performed in episodes of at least 10 minutes, and preferably, it should be spread throughout the week. Start walking for 30 minutes 5 days per week.  Behavioral modification strategies: keeping healthy foods in the home, emotional eating strategies, holiday eating strategies  and planning for success.  Joseph Graves has agreed to follow-up with our clinic in 3 weeks. He was informed of the importance of  frequent follow-up visits to maximize his success with intensive lifestyle modifications for his multiple health conditions.   Joseph Graves was informed we would discuss his lab results at his next visit unless there is a critical issue that needs to be addressed sooner. Joseph Graves agreed to keep his next visit at the agreed upon time to discuss these results.  Objective:   Blood pressure 109/73, pulse (!) 59, temperature (!) 97.5 F (36.4 C), height _0  (1.93 m), weight (!) 334 lb (151.5 kg), SpO2 99 %. Body mass index is 40.66 kg/m.  General: Cooperative, alert, well developed, in no acute distress. HEENT: Conjunctivae and lids unremarkable. Cardiovascular: Regular rhythm.  Lungs: Normal work of breathing. Neurologic: No focal deficits.   Lab Results  Component Value Date   CREATININE 0.96 08/01/2020   BUN 16 08/01/2020   NA 141 08/01/2020   K 4.4 08/01/2020   CL 103 08/01/2020   CO2 25 08/01/2020   Lab Results  Component Value Date  ALT 22 08/01/2020   AST 17 08/01/2020   ALKPHOS 73 08/01/2020   BILITOT 0.6 08/01/2020   Lab Results  Component Value Date   HGBA1C 5.6 08/01/2020   HGBA1C 5.3 02/14/2020   HGBA1C 5.8 (H) 10/19/2019   HGBA1C 5.5 06/07/2018   HGBA1C 5.5 04/27/2017   Lab Results  Component Value Date   INSULIN 31.0 (H) 08/01/2020   INSULIN 14.8 02/14/2020   INSULIN 41.0 (H) 10/19/2019   Lab Results  Component Value Date   TSH 2.130 08/01/2020   Lab Results  Component Value Date   CHOL 131 08/01/2020   HDL 52 08/01/2020   LDLCALC 63 08/01/2020   TRIG 83 08/01/2020   CHOLHDL 3.6 06/07/2018   Lab Results  Component Value Date   WBC 6.1 06/07/2018   HGB 14.2 06/07/2018   HCT 42.4 06/07/2018   MCV 90 06/07/2018   PLT 311 06/07/2018   Attestation Statements:   Reviewed by clinician on day of visit: allergies, medications, problem list, medical history, surgical history, family history, social history, and previous encounter notes.  I, Network engineer, CMA, am acting as Location manager for [employer], DO.  I have reviewed the above documentation for accuracy and completeness, and I agree with the above. Marjory Sneddon, D.O.  The [place] was signed into law in 2016 which includes the topic of electronic health records.  This provides immediate access to information in MyChart.  This includes consultation notes, operative notes, office notes, lab results and pathology reports.  If you have any questions about what you read please let us know at your next visit so we can discuss your concerns and take corrective action if need be.  We are right here with you.

## 2020-08-02 NOTE — ED Provider Notes (Signed)
[facility]    CSN: [id number] Arrival date & time: 08/02/20  0805      History   Chief Complaint Chief Complaint  Patient presents with   Eye Problem    HPI Joseph Graves is a 49 y.o. male.   Presenting today with 4 day hx of left eye irritation and foreign body sensation, now with redness swelling and tenderness of the skin surrounding the eye. Denies blurry vision, headaches, N/V, known injury to the eye. Has tried OTC eye flushes without much benefit.      Past Medical History:  Diagnosis Date   Allergy    Anxiety    Asthma    outgrew at 10-18 years of age.    Depression    Edema of both feet    GERD (gastroesophageal reflux disease)    Hypertension    Lactose intolerance    Numbness in feet    Sleep apnea     Patient Active Problem List   Diagnosis Date Noted   Essential hypertension 08/01/2020   Low testosterone 08/01/2020   Adjustment disorder with mixed anxiety and depressed mood 08/01/2020   Nonalcoholic hepatosteatosis 08/01/2020   Low testosterone in male 08/01/2020   At risk for impaired metabolic function 08/01/2020   Insulin resistance 03/06/2020   B12 deficiency 02/14/2020   Prediabetes 10/20/2019   Low HDL (under 40) 10/20/2019   Elevated ALT measurement 10/20/2019   Erectile dysfunction due to diseases classified elsewhere 09/26/2019   Testosterone insufficiency 09/26/2019   History of non anemic vitamin B12 deficiency 06/07/2018   Depression 03/02/2018   Hypertension 04/27/2017   GERD (gastroesophageal reflux disease) 04/27/2017   Environmental and seasonal allergies 04/27/2017   Vitamin D deficiency 04/27/2017   Adjustment disorder with depressed mood in remission 04/27/2017   Class 2 severe obesity with serious comorbidity and body mass index (BMI) of 37.0 to 37.9 in adult (HCC) 04/27/2017   Numbness and tingling of both feet 04/27/2017   OSA on CPAP 04/27/2017   Family history of  suicide- father age 57 04/27/2017   Family history of alcoholism in grandfathers 04/27/2017   Family history of drug addiction 04/27/2017   Family history of depression 04/27/2017   Family history of diet-controlled diabetes- father in 68s 04/27/2017   Family history of non-Hodgkin's lymphoma- father 04/27/2017   Erectile dysfunction 04/19/2015    Past Surgical History:  Procedure Laterality Date   BICEPS TENDON REPAIR  11/2013   NASAL SINUS SURGERY     VASECTOMY         Home Medications    Prior to Admission medications   Medication Sig Start Date End Date Taking? Authorizing Provider  buPROPion (WELLBUTRIN XL) 150 MG 24 hr tablet Take 2 tablets (300 mg total) by mouth daily. **NEEDS APT FOR FURTHER REFILLS** 08/01/20  Yes Opalski, Gavin Pound, DO  cetirizine (ZYRTEC) 10 MG tablet Take 10 mg by mouth daily.   Yes [provider]  Cholecalciferol (VITAMIN D3) 125 MCG (5000 UT) CAPS 5000 IU Monday-Friday and 10,000 IU on Saturday and Sunday. 03/06/20  Yes Whitmire, Dawn W, FNP  fluticasone (FLONASE) 50 MCG/ACT nasal spray Place 2 sprays into both nostrils daily. Patient taking differently: Place 2 sprays into both nostrils daily as needed for allergies.  09/16/17  Yes Elvina Sidle, MD  hydrochlorothiazide (MICROZIDE) 12.5 MG capsule Take 1 capsule (12.5 mg total) by mouth daily. 08/01/20  Yes Opalski, Deborah, DO  losartan (COZAAR) 100 MG tablet TAKE 1 TABLET BY MOUTH ONCE  DAILY 07/11/20  Yes Opalski, Gavin Pound, DO  metFORMIN (GLUCOPHAGE) 500 MG tablet Take 1 tablet (500 mg total) by mouth 2 (two) times daily with a meal. 07/11/20  Yes Opalski, Deborah, DO  montelukast (SINGULAIR) 10 MG tablet Take 1 tablet (10 mg total) by mouth at bedtime. 04/03/20  Yes Opalski, Gavin Pound, DO  Omega-3 Fatty Acids (FISH OIL) 1200 MG CAPS Take 1,200 mg by mouth daily.    Yes [provider]  OVER THE COUNTER MEDICATION Take 1 tablet by mouth daily. [employer] Mega Men Prostate and  Virility Supplement   Yes [provider]  tadalafil (CIALIS) 20 MG tablet Take 0.5-1 tablets (10-20 mg total) by mouth daily as needed for erectile dysfunction. 1 hour prior 09/26/19  Yes Opalski, Deborah, DO  erythromycin ophthalmic ointment Place a 1/2 inch ribbon of ointment into the lower eyelid. 08/02/20   Particia Nearing, PA-C  pimecrolimus (ELIDEL) 1 % cream Apply topically 2 (two) times daily. Apply around left eye twice daily as needed 08/02/20   Particia Nearing, PA-C    Family History Family History  Problem Relation Age of Onset   Stroke Mother    Allergic rhinitis Mother    Sleep apnea Mother    Obesity Mother    Bipolar disorder Mother    Depression Father        suicide   Cancer Father    Diabetes Father    Allergic rhinitis Father    Anxiety disorder Father    Diabetes Paternal Uncle    Alcohol abuse Maternal Grandfather    Hypertension Maternal Grandfather    Alcohol abuse Paternal Grandfather     Social History Social History   Tobacco Use   Smoking status: Former Smoker    Packs/day: 0.25    Years: 4.50    Pack years: 1.12    Quit date: 1992    Years since quitting: 29.8   Smokeless tobacco: Never Used  Building services engineer Use: Never used  Substance Use Topics   Alcohol use: Yes    Alcohol/week: 1.0 standard drink    Types: 1 Cans of beer per week   Drug use: No     Allergies   Amoxicillin-pot clavulanate and Penicillins   Review of Systems Review of Systems PER HPI   Physical Exam Triage Vital Signs ED Triage Vitals  Enc Vitals Group     BP 08/02/20 0834 130/89     Pulse Rate 08/02/20 0834 88     Resp 08/02/20 0834 18     Temp 08/02/20 0834 97.6 F (36.4 C)     Temp Source 08/02/20 0834 Oral     SpO2 08/02/20 0834 99 %     Weight --      Height --      Head Circumference --      Peak Flow --      Pain Score 08/02/20 0831 2     Pain Loc --      Pain Edu? --      Excl. in GC? --    No  data found.  Updated Vital Signs BP 130/89 (BP Location: Left Arm)    Pulse 88    Temp 97.6 F (36.4 C) (Oral)    Resp 18    SpO2 99%   Visual Acuity Right Eye Distance: 20/20 Left Eye Distance: 20/25 Bilateral Distance: 20/25  Right Eye Near:   Left Eye Near:    Bilateral Near:  Physical Exam Vitals and nursing note reviewed.  Constitutional:      Appearance: Normal appearance.  HENT:     Head: Atraumatic.     Nose: Nose normal.     Mouth/Throat:     Mouth: Mucous membranes are moist.     Pharynx: Oropharynx is clear.  Eyes:     Extraocular Movements: Extraocular movements intact.     Conjunctiva/sclera: Conjunctivae normal.     Comments: Pinpoint papular lesion without discoloration below iris at 6 o clock left eye Significant erythema and edema of periorbital area  Cardiovascular:     Rate and Rhythm: Normal rate and regular rhythm.  Pulmonary:     Effort: Pulmonary effort is normal.     Breath sounds: Normal breath sounds.  Musculoskeletal:        General: Normal range of motion.     Cervical back: Normal range of motion and neck supple.  Skin:    General: Skin is warm and dry.  Neurological:     General: No focal deficit present.     Mental Status: He is oriented to person, place, and time.  Psychiatric:        Mood and Affect: Mood normal.        Thought Content: Thought content normal.        Judgment: Judgment normal.      UC Treatments / Results  Labs (all labs ordered are listed, but only abnormal results are displayed) Labs Reviewed - No data to display  EKG   Radiology No results found.  Procedures Procedures (including critical care time)  Medications Ordered in UC Medications - No data to display  Initial Impression / Assessment and Plan / UC Course  I have reviewed the triage vital signs and the nursing notes.  Pertinent labs & imaging results that were available during my care of the patient were reviewed by me and considered  in my medical decision making (see chart for details).     Will tx with erythromycin ointment to cover for abrasion or infection in the eye, elidel for periorbital skin to help with the irritation and inflammation. Discussed warm compresses and close Ophthalmology f/u if not resolving.   Final Clinical Impressions(s) / UC Diagnoses   Final diagnoses:  Acute conjunctivitis of left eye, unspecified acute conjunctivitis type     Discharge Instructions     Follow up with Eye Specialist if not resolving    ED Prescriptions    Medication Sig Dispense Auth. Provider   erythromycin ophthalmic ointment Place a 1/2 inch ribbon of ointment into the lower eyelid. 3.5 g Particia Nearing, PA-C   pimecrolimus (ELIDEL) 1 % cream Apply topically 2 (two) times daily. Apply around left eye twice daily as needed 30 g Particia Nearing, [place]     PDMP not reviewed this encounter.   Roosvelt Maser [place], [place] 08/02/20 [phone]

## 2020-08-02 NOTE — Discharge Instructions (Addendum)
Follow up with Eye Specialist if not resolving

## 2020-08-06 ENCOUNTER — Ambulatory Visit (INDEPENDENT_AMBULATORY_CARE_PROVIDER_SITE_OTHER): Payer: 59 | Admitting: Physician Assistant

## 2020-08-06 ENCOUNTER — Other Ambulatory Visit: Payer: Self-pay

## 2020-08-06 ENCOUNTER — Encounter: Payer: Self-pay | Admitting: Physician Assistant

## 2020-08-06 ENCOUNTER — Other Ambulatory Visit: Payer: Self-pay | Admitting: Physician Assistant

## 2020-08-06 VITALS — BP 109/72 | Ht 76.0 in | Wt 332.0 lb

## 2020-08-06 DIAGNOSIS — J3089 Other allergic rhinitis: Secondary | ICD-10-CM | POA: Diagnosis not present

## 2020-08-06 DIAGNOSIS — I10 Essential (primary) hypertension: Secondary | ICD-10-CM

## 2020-08-06 DIAGNOSIS — R7303 Prediabetes: Secondary | ICD-10-CM | POA: Diagnosis not present

## 2020-08-06 DIAGNOSIS — F3289 Other specified depressive episodes: Secondary | ICD-10-CM

## 2020-08-06 DIAGNOSIS — Z6841 Body Mass Index (BMI) 40.0 and over, adult: Secondary | ICD-10-CM

## 2020-08-06 DIAGNOSIS — E349 Endocrine disorder, unspecified: Secondary | ICD-10-CM | POA: Diagnosis not present

## 2020-08-06 DIAGNOSIS — N521 Erectile dysfunction due to diseases classified elsewhere: Secondary | ICD-10-CM | POA: Diagnosis not present

## 2020-08-06 MED ORDER — LOSARTAN POTASSIUM 100 MG PO TABS: 100.0000 mg | ORAL_TABLET | Freq: Every day | ORAL | 1 refills | Status: DC

## 2020-08-06 MED ORDER — TADALAFIL 20 MG PO TABS: 10.0000 mg | ORAL_TABLET | Freq: Every day | ORAL | 1 refills | Status: DC | PRN

## 2020-08-06 MED ORDER — HYDROCHLOROTHIAZIDE 12.5 MG PO CAPS: 12.5000 mg | ORAL_CAPSULE | Freq: Every day | ORAL | 1 refills | Status: DC

## 2020-08-06 MED ORDER — FLUTICASONE PROPIONATE 50 MCG/ACT NA SUSP: 2.0000 | Freq: Every day | NASAL | 1 refills | Status: DC

## 2020-08-06 MED ORDER — MONTELUKAST SODIUM 10 MG PO TABS: 10.0000 mg | ORAL_TABLET | Freq: Every day | ORAL | 1 refills | Status: DC

## 2020-08-06 MED ORDER — CETIRIZINE HCL 10 MG PO TABS: 10.0000 mg | ORAL_TABLET | Freq: Every day | ORAL | 2 refills | Status: DC

## 2020-08-06 MED ORDER — BUPROPION HCL ER (XL) 150 MG PO TB24: 300.0000 mg | ORAL_TABLET | Freq: Every day | ORAL | 1 refills | Status: DC

## 2020-08-06 MED FILL — TADALAFIL 20 MG TABS: 20 | 30 days supply | Qty: 6 | Fill #0

## 2020-08-06 MED FILL — LOSARTAN POTASSIUM 100 MG T: 100 | 90 days supply | Qty: 90 | Fill #0

## 2020-08-06 MED FILL — CETIRIZINE HCL 10 MG TABS: 10 | 90 days supply | Qty: 90 | Fill #0

## 2020-08-06 MED FILL — FLUTICASONE PROP 50 MCG SPR: 50 | 30 days supply | Qty: 16 | Fill #0

## 2020-08-06 NOTE — Progress Notes (Signed)
Telehealth office visit note for Joseph Masker, PA-C- at Primary Care at [facility]   I connected with current patient today by telephone and verified that I am speaking with the correct person    Location of the patient: Home  Location of the provider: Office - This visit type was conducted due to national recommendations for restrictions regarding the COVID-19 Pandemic (e.g. social distancing) in an effort to limit this patient's exposure and mitigate transmission in our community.    - No physical exam could be performed with this format, beyond that communicated to [place] by the patient/ family members as noted.   - Additionally my office staff/ schedulers were to discuss with the patient that there may be a monetary charge related to this service, depending on their medical insurance.  My understanding is that patient understood and consented to proceed.     _________________________________________________________________________________   History of Present Illness: Patient calls in to follow-up hypertension, prediabetes, ED and requesting medication refills. Patient is followed by [facility] for weight loss. Has no acute concerns today.  HTN: Pt denies chest pain, palpitations, dizziness, headache or lower extremity swelling. Taking medication as directed without side effects. Checks BP at home and readings range 109s/70s. Pt reports good hydration.  Prediabetes: Patient on Metformin which is managed by [facility]. Reports monitors carbohydrates and glucose.   ED: Requesting medication refill. Has no concerns.   Mood: Wellbutrin helps keep mood stable.  Denies SI/HI.   No flowsheet data found.  Depression screen [facility] 2/9 08/06/2020 10/19/2019 09/26/2019 06/07/2018 03/02/2018  Decreased Interest 0 1 0 0 0  Down, Depressed, Hopeless 0 1 0 0 0  PHQ - 2 Score 0 2 0 0 0  Altered sleeping 0 3 0 0 0  Tired, decreased energy 0 1 0 0 0  Change in appetite 0 2 0 0 0   Feeling bad or failure about yourself  0 1 0 0 0  Trouble concentrating 0 2 0 0 1  Moving slowly or fidgety/restless 0 0 0 0 0  Suicidal thoughts 0 0 0 0 0  PHQ-9 Score 0 11 0 0 1  Difficult doing work/chores - Not difficult at all - - Not difficult at all      Impression and Recommendations:     1. Essential hypertension   2. Prediabetes   3. Class 3 severe obesity with serious comorbidity and body mass index (BMI) of 40.0 to 44.9 in adult, unspecified obesity type (HCC)   4. Erectile dysfunction due to diseases classified elsewhere   5. Other depression, with emotional eatimg   6. Testosterone insufficiency-  was 221, 248 in 2013   7. Environmental and seasonal allergies     Essential hypertension: -BP at goal. -Continue current medication regimen.  Provided refills. -Follow low-sodium diet. -Continue with weight loss efforts and good hydration. -Last CMP WNL -Will continue to monitor.  Prediabetes: -Last A1c 5.6, well controlled. -Continue current medication regimen. -Monitor carbohydrates and glucose. -Will continue to monitor alongside [facility].  Class III severe obesity with serious comorbidity and body mass index (BMI) of 40.0 to 44.9 in adult, unspecified obesity type: -Patient has lost approximately 38 pounds since last OV in 09/2019. -Followed by [facility].  -Encourage to continue with weight loss efforts.  Other depression, with emotional eating: -PHQ-9 score of 0 -Continue current medication regimen. Provided refill. -Will continue to monitor.  ED due to diseases  classified elsewhere, Testosterone insufficiency: -Stable -Last testosterone 259, free testosterone 6.1 -Continue current medication regimen. Provided refill. -BP wnl   Environmental and seasonal allergies: -Continue current medication regimen. Provided refills.     - As part of my medical decision making, I reviewed the following data within the  electronic MEDICAL RECORD NUMBER History obtained from pt /family, CMA notes reviewed and incorporated if applicable, Labs reviewed, Radiograph/ tests reviewed if applicable and OV notes from prior OV's with me, as well as any other specialists she/he has seen since seeing me last, were all reviewed and used in my medical decision making process today.    - Additionally, when appropriate, discussion had with patient regarding our treatment plan, and their biases/concerns about that plan were used in my medical decision making today.    - The patient agreed with the plan and demonstrated an understanding of the instructions.   No barriers to understanding were identified.     - The patient was advised to call back or seek an in-person evaluation if the symptoms worsen or if the condition fails to improve as anticipated.   Return in about 6 months (around 02/03/2021) for CPE.    No orders of the defined types were placed in this encounter.   Meds ordered this encounter  Medications   buPROPion (WELLBUTRIN XL) 150 MG 24 hr tablet    Sig: Take 2 tablets (300 mg total) by mouth daily.    Dispense:  180 tablet    Refill:  1   losartan (COZAAR) 100 MG tablet    Sig: Take 1 tablet (100 mg total) by mouth daily.    Dispense:  90 tablet    Refill:  1   hydrochlorothiazide (MICROZIDE) 12.5 MG capsule    Sig: Take 1 capsule (12.5 mg total) by mouth daily.    Dispense:  90 capsule    Refill:  1   tadalafil (CIALIS) 20 MG tablet    Sig: Take 0.5-1 tablets (10-20 mg total) by mouth daily as needed for erectile dysfunction. 1 hour prior    Dispense:  6 tablet    Refill:  1   montelukast (SINGULAIR) 10 MG tablet    Sig: Take 1 tablet (10 mg total) by mouth at bedtime.    Dispense:  90 tablet    Refill:  1    Order Specific Question:   Supervising Provider    Answer:   Nani GasserMETHENEY, CATHERINE D [[id number]]   fluticasone (FLONASE) 50 MCG/ACT nasal spray    Sig: Place 2 sprays into both nostrils daily.     Dispense:  15.8 g    Refill:  1    Order Specific Question:   Supervising Provider    Answer:   Nani GasserMETHENEY, CATHERINE D [[id number]]   cetirizine (ZYRTEC) 10 MG tablet    Sig: Take 1 tablet (10 mg total) by mouth daily.    Dispense:  90 tablet    Refill:  2    Order Specific Question:   Supervising Provider    Answer:   Nani GasserMETHENEY, CATHERINE D [[id number]]    Medications Discontinued During This Encounter  Medication Reason   cetirizine (ZYRTEC) 10 MG tablet Reorder   fluticasone (FLONASE) 50 MCG/ACT nasal spray Reorder   tadalafil (CIALIS) 20 MG tablet Reorder   montelukast (SINGULAIR) 10 MG tablet Reorder   losartan (COZAAR) 100 MG tablet Reorder   buPROPion (WELLBUTRIN XL) 150 MG 24 hr tablet Reorder   hydrochlorothiazide (MICROZIDE) 12.5 MG capsule Reorder  Time spent on visit including pre-visit chart review and post-visit care was 12 minutes.      The 21st Century Cures Act was signed into law in 2016 which includes the topic of electronic health records.  This provides immediate access to information in MyChart.  This includes consultation notes, operative notes, office notes, lab results and pathology reports.  If you have any questions about what you read please let us know at your next visit or call us at the office.  We are right here with you.  Note:  This note was prepared with assistance of Dragon voice recognition software. Occasional wrong-word or sound-a-like substitutions may have occurred due to the inherent limitations of voice recognition software.  __________________________________________________________________________________     Patient Care Team    Relationship Specialty Notifications Start End  Joseph Graves, [place] PCP - General   01/22/20      -Vitals obtained; medications/ allergies reconciled;  personal medical, social, Sx etc.histories were updated by CMA, reviewed by me and are reflected in chart   Patient Active Problem List    Diagnosis Date Noted   Essential hypertension 08/01/2020   Low testosterone 08/01/2020   Adjustment disorder with mixed anxiety and depressed mood 08/01/2020   Nonalcoholic hepatosteatosis 08/01/2020   Low testosterone in male 08/01/2020   At risk for impaired metabolic function 08/01/2020   Insulin resistance 03/06/2020   B12 deficiency 02/14/2020   Prediabetes 10/20/2019   Low HDL (under 40) 10/20/2019   Elevated ALT measurement 10/20/2019   Erectile dysfunction due to diseases classified elsewhere 09/26/2019   Testosterone insufficiency 09/26/2019   History of non anemic vitamin B12 deficiency 06/07/2018   Depression 03/02/2018   Hypertension 04/27/2017   GERD (gastroesophageal reflux disease) 04/27/2017   Environmental and seasonal allergies 04/27/2017   Vitamin D deficiency 04/27/2017   Adjustment disorder with depressed mood in remission 04/27/2017   Class 2 severe obesity with serious comorbidity and body mass index (BMI) of 37.0 to 37.9 in adult (HCC) 04/27/2017   Numbness and tingling of both feet 04/27/2017   OSA on CPAP 04/27/2017   Family history of suicide- father age 47 04/27/2017   Family history of alcoholism in grandfathers 04/27/2017   Family history of drug addiction 04/27/2017   Family history of depression 04/27/2017   Family history of diet-controlled diabetes- father in 35s 04/27/2017   Family history of non-Hodgkin's lymphoma- father 04/27/2017   Erectile dysfunction 04/19/2015     Current Meds  Medication Sig   buPROPion (WELLBUTRIN XL) 150 MG 24 hr tablet Take 2 tablets (300 mg total) by mouth daily.   cetirizine (ZYRTEC) 10 MG tablet Take 1 tablet (10 mg total) by mouth daily.   Cholecalciferol (VITAMIN D3) 125 MCG (5000 UT) CAPS 5000 IU Monday-Friday and 10,000 IU on Saturday and Sunday.   erythromycin ophthalmic ointment Place a 1/2 inch ribbon of ointment into the lower eyelid.   fluticasone (FLONASE) 50 MCG/ACT  nasal spray Place 2 sprays into both nostrils daily.   hydrochlorothiazide (MICROZIDE) 12.5 MG capsule Take 1 capsule (12.5 mg total) by mouth daily.   losartan (COZAAR) 100 MG tablet Take 1 tablet (100 mg total) by mouth daily.   metFORMIN (GLUCOPHAGE) 500 MG tablet Take 1 tablet (500 mg total) by mouth 2 (two) times daily with a meal.   montelukast (SINGULAIR) 10 MG tablet Take 1 tablet (10 mg total) by mouth at bedtime.   Omega-3 Fatty Acids (FISH OIL) 1200 MG CAPS Take 1,200 mg by mouth  daily.    OVER THE COUNTER MEDICATION Take 1 tablet by mouth daily. [employer] Mega Men Prostate and Virility Supplement   tadalafil (CIALIS) 20 MG tablet Take 0.5-1 tablets (10-20 mg total) by mouth daily as needed for erectile dysfunction. 1 hour prior   [DISCONTINUED] buPROPion (WELLBUTRIN XL) 150 MG 24 hr tablet Take 2 tablets (300 mg total) by mouth daily. **NEEDS APT FOR FURTHER REFILLS**   [DISCONTINUED] cetirizine (ZYRTEC) 10 MG tablet Take 10 mg by mouth daily.   [DISCONTINUED] fluticasone (FLONASE) 50 MCG/ACT nasal spray Place 2 sprays into both nostrils daily. (Patient taking differently: Place 2 sprays into both nostrils daily as needed for allergies. )   [DISCONTINUED] hydrochlorothiazide (MICROZIDE) 12.5 MG capsule Take 1 capsule (12.5 mg total) by mouth daily.   [DISCONTINUED] losartan (COZAAR) 100 MG tablet TAKE 1 TABLET BY MOUTH ONCE DAILY   [DISCONTINUED] montelukast (SINGULAIR) 10 MG tablet Take 1 tablet (10 mg total) by mouth at bedtime.   [DISCONTINUED] tadalafil (CIALIS) 20 MG tablet Take 0.5-1 tablets (10-20 mg total) by mouth daily as needed for erectile dysfunction. 1 hour prior     Allergies:  Allergies  Allergen Reactions   Amoxicillin-Pot Clavulanate Rash   Penicillins Hives and Other (See Comments)    Has patient had a PCN reaction causing immediate rash, facial/tongue/throat swelling, SOB or lightheadedness with hypotension:  Has patient had a PCN reaction causing  severe rash involving mucus membranes or skin necrosis:  Has patient had a PCN reaction that required hospitalization:  Has patient had a PCN reaction occurring within the last 10 years:  If all of the above answers are "NO", then may proceed with Cephalosporin use.      ROS:  See above HPI for pertinent positives and negatives   Objective:   Blood pressure 109/72, height 6\' 4"  (1.93 m), weight (!) 332 lb (150.6 kg).  (if some vitals are omitted, this means that patient was UNABLE to obtain them even though they were asked to get them prior to OV today.  They were asked to call at their earliest convenience with these once obtained. ) General: A & O * 3; sounds in no acute distress; in usual state of health.  Respiratory: speaking in full sentences, no conversational dyspnea Psych: insight appears good, mood- appears full

## 2020-08-20 MED FILL — MONTELUKAST SOD 10 MG TAB: 10 | 90 days supply | Qty: 90 | Fill #0

## 2020-08-22 ENCOUNTER — Other Ambulatory Visit (HOSPITAL_COMMUNITY): Payer: Self-pay | Admitting: Family Medicine

## 2020-08-22 ENCOUNTER — Ambulatory Visit (INDEPENDENT_AMBULATORY_CARE_PROVIDER_SITE_OTHER): Payer: 59 | Admitting: Family Medicine

## 2020-08-22 ENCOUNTER — Other Ambulatory Visit: Payer: Self-pay

## 2020-08-22 ENCOUNTER — Encounter (INDEPENDENT_AMBULATORY_CARE_PROVIDER_SITE_OTHER): Payer: Self-pay | Admitting: Family Medicine

## 2020-08-22 VITALS — BP 110/72 | HR 60 | Temp 97.5°F | Ht 76.0 in | Wt 333.0 lb

## 2020-08-22 DIAGNOSIS — R7303 Prediabetes: Secondary | ICD-10-CM | POA: Diagnosis not present

## 2020-08-22 DIAGNOSIS — J302 Other seasonal allergic rhinitis: Secondary | ICD-10-CM | POA: Diagnosis not present

## 2020-08-22 DIAGNOSIS — Z6841 Body Mass Index (BMI) 40.0 and over, adult: Secondary | ICD-10-CM

## 2020-08-22 DIAGNOSIS — Z9189 Other specified personal risk factors, not elsewhere classified: Secondary | ICD-10-CM | POA: Insufficient documentation

## 2020-08-22 MED ORDER — METFORMIN HCL 500 MG PO TABS: 500.0000 mg | ORAL_TABLET | Freq: Two times a day (BID) | ORAL | 0 refills | Status: DC

## 2020-08-22 MED ORDER — LEVOCETIRIZINE DIHYDROCHLORIDE 5 MG PO TABS: 5.0000 mg | ORAL_TABLET | Freq: Every evening | ORAL | 0 refills | Status: DC

## 2020-08-22 MED ORDER — AZELASTINE HCL 0.1 % NA SOLN: 1.0000 | Freq: Two times a day (BID) | NASAL | 0 refills | Status: DC

## 2020-08-22 MED FILL — LEVOCETIRIZINE 5 MG TABLET: 5 | 30 days supply | Qty: 30 | Fill #0

## 2020-08-22 MED FILL — AZELASTINE HCL 137 MCG/SPRA: 137 | 50 days supply | Qty: 30 | Fill #0

## 2020-08-22 MED FILL — METFORMIN HCL 500 MG TABS: 500 | 30 days supply | Qty: 60 | Fill #0

## 2020-08-22 NOTE — Progress Notes (Signed)
Chief Complaint:   OBESITY Joseph Graves is here to discuss his progress with his obesity treatment plan along with follow-up of his obesity related diagnoses. Joseph Graves is on the Category 4 Plan and states he is following his eating plan approximately 70% of the time. Joseph Graves states he is walking 10 minutes 5 times per week.  Today's visit was #: 17 Starting weight: 364 lbs Starting date: 10/19/2019 Today's weight: 333 lbs Today's date: 08/22/2020 Total lbs lost to date: 31 lbs Total lbs lost since last in-office visit: 1 lb Total weight loss percentage to date: -8.52%  Interim History: Joseph Graves is dealing with sinus congestion and not feeling great, therefore he notes that he has not been eating on the plan. Instead he has been eating fast food more, such as [employer] fried chicken sandwich and drinking Hi[employer] orange drink from [employer].  Assessment/Plan:   Meds ordered this encounter  Medications  . metFORMIN (GLUCOPHAGE) 500 MG tablet    Sig: Take 1 tablet (500 mg total) by mouth 2 (two) times daily with a meal.    Dispense:  60 tablet    Refill:  0  . levocetirizine (XYZAL) 5 MG tablet    Sig: Take 1 tablet (5 mg total) by mouth every evening.    Dispense:  30 tablet    Refill:  0  . azelastine (ASTELIN) 0.1 % nasal spray    Sig: Place 1 spray into both nostrils 2 (two) times daily. 1 spray each nostril twice daily after sinus rinses    Dispense:  30 mL    Refill:  0      1. Seasonal allergies Horace takes Singulair, Zyrtec, and Flonase for allergies. He has had an increase in symptoms over the past several weeks. He denies fevers, SOB and chills. Colvin received his COVID-19 vaccine series over 6 months ago but has not had his booster yet.  Plan: Start Astelin nasal spray and Xyzal. Discontinue Zyrtec. Continue Flonase and Singulair. Also, do nasal rinses and get booster vaccine.  Start- levocetirizine (XYZAL) 5 MG tablet; Take 1 tablet (5 mg total) by mouth every evening.   Dispense: 30 tablet; Refill: 0  Start- azelastine (ASTELIN) 0.1 % nasal spray; Place 1 spray into both nostrils 2 (two) times daily. 1 spray each nostril twice daily after sinus rinses  Dispense: 30 mL; Refill: 0  2. Pre-diabetes Joseph Graves is prescribed Metformin 500 mg. He reports that he still forgets his 2nd dose. He has increased snacking after dinner.  Plan: In the future we will consider adding GLP-!, which he declines right now, as his wife had negative side effects to Ozempic.  - REFILL metFORMIN (GLUCOPHAGE) 500 MG tablet; Take 1 tablet (500 mg total) by mouth 2 (two) times daily with a meal.  Dispense: 60 tablet; Refill: 0  3. At risk for diabetes mellitus Joseph Graves was given diabetes education and counseling today. We discussed intensive lifestyle modifications today with an emphasis on weight loss as well as increasing exercise and decreasing simple carbohydrates in his diet. We also reviewed medication options with an emphasis on risk versus benefit of those discussed.   4. Class 3 severe obesity with serious comorbidity and body mass index (BMI) of 40.0 to 44.9 in adult, unspecified obesity type (HCC) Joseph Graves is currently in the action stage of change. As such, his goal is to continue with weight loss efforts. He has agreed to the Category 4 Plan.   Exercise goals: For substantial health benefits, adults should do  at least 150 minutes (2 hours and 30 minutes) a week of moderate-intensity, or 75 minutes (1 hour and 15 minutes) a week of vigorous-intensity aerobic physical activity, or an equivalent combination of moderate- and vigorous-intensity aerobic activity. Aerobic activity should be performed in episodes of at least 10 minutes, and preferably, it should be spread throughout the week.  Behavioral modification strategies: meal planning and cooking strategies, keeping healthy foods in the home and planning for success.  Joseph Graves has agreed to follow-up with our clinic in 2-3 weeks. He  was informed of the importance of frequent follow-up visits to maximize his success with intensive lifestyle modifications for his multiple health conditions.   Objective:   Blood pressure 110/72, pulse 60, temperature (!) 97.5 F (36.4 C), height 6\' 4"  (1.93 m), weight (!) 333 lb (151 kg), SpO2 98 %. Body mass index is 40.53 kg/m.  General: Cooperative, alert, well developed, in no acute distress. HEENT: Conjunctivae and lids unremarkable. Cardiovascular: Regular rhythm.  Lungs: Normal work of breathing. Neurologic: No focal deficits.   Lab Results  Component Value Date   CREATININE 0.96 08/01/2020   BUN 16 08/01/2020   NA 141 08/01/2020   K 4.4 08/01/2020   CL 103 08/01/2020   CO2 25 08/01/2020   Lab Results  Component Value Date   ALT 22 08/01/2020   AST 17 08/01/2020   ALKPHOS 73 08/01/2020   BILITOT 0.6 08/01/2020   Lab Results  Component Value Date   HGBA1C 5.6 08/01/2020   HGBA1C 5.3 02/14/2020   HGBA1C 5.8 (H) 10/19/2019   HGBA1C 5.5 06/07/2018   HGBA1C 5.5 04/27/2017   Lab Results  Component Value Date   INSULIN 31.0 (H) 08/01/2020   INSULIN 14.8 02/14/2020   INSULIN 41.0 (H) 10/19/2019   Lab Results  Component Value Date   TSH 2.130 08/01/2020   Lab Results  Component Value Date   CHOL 131 08/01/2020   HDL 52 08/01/2020   LDLCALC 63 08/01/2020   TRIG 83 08/01/2020   CHOLHDL 3.6 06/07/2018   Lab Results  Component Value Date   WBC 6.1 06/07/2018   HGB 14.2 06/07/2018   HCT 42.4 06/07/2018   MCV 90 06/07/2018   PLT 311 06/07/2018    Attestation Statements:   Reviewed by clinician on day of visit: allergies, medications, problem list, medical history, surgical history, family history, social history, and previous encounter notes.  06/09/2018, am acting as Edmund Hilda for Energy manager, DO.  I have reviewed the above documentation for accuracy and completeness, and I agree with the above. [employer], D.O.  The  21st Century Cures Act was signed into law in 2016 which includes the topic of electronic health records.  This provides immediate access to information in MyChart.  This includes consultation notes, operative notes, office notes, lab results and pathology reports.  If you have any questions about what you read please let 2017 know at your next visit so we can discuss your concerns and take corrective action if need be.  We are right here with you.

## 2020-09-03 MED FILL — buPROPion HCL ER (XL) 150 M: 150 | 90 days supply | Qty: 180 | Fill #0

## 2020-09-03 MED FILL — HYDROCHLOROTHIAZIDE 12.5 MG: 12.5 | 90 days supply | Qty: 90 | Fill #0

## 2020-09-11 ENCOUNTER — Encounter (INDEPENDENT_AMBULATORY_CARE_PROVIDER_SITE_OTHER): Payer: Self-pay | Admitting: Family Medicine

## 2020-09-11 ENCOUNTER — Other Ambulatory Visit: Payer: Self-pay

## 2020-09-11 ENCOUNTER — Ambulatory Visit (INDEPENDENT_AMBULATORY_CARE_PROVIDER_SITE_OTHER): Payer: 59 | Admitting: Family Medicine

## 2020-09-11 VITALS — BP 119/78 | HR 63 | Temp 97.6°F | Ht 76.0 in | Wt 336.0 lb

## 2020-09-11 DIAGNOSIS — R7303 Prediabetes: Secondary | ICD-10-CM | POA: Diagnosis not present

## 2020-09-11 DIAGNOSIS — J302 Other seasonal allergic rhinitis: Secondary | ICD-10-CM

## 2020-09-11 NOTE — Progress Notes (Signed)
Chief Complaint:   OBESITY Joseph Graves is here to discuss his progress with his obesity treatment plan along with follow-up of his obesity related diagnoses. Joseph Graves is on the Category 4 Plan and states he is following his eating plan approximately 70% of the time. Joseph Graves states he is walking 50 minutes 4 times per week.  Today's visit was #: 18 Starting weight: 364 lbs Starting date: 10/19/2019 Today's weight: 336 lbs Today's date: 09/11/2020 Total lbs lost to date: 28 lbs Total lbs lost since last in-office visit: +3 lbs Total weight loss percentage to date: -7.69%  Interim History: Joseph Graves reports that he has been eating Christmas cookies that his wife makes. He traveled this past weekend and ate out the entire time. Otherwise, he denies issues with the plan. He denies hunger or cravings. Joseph Graves has been walking on the treadmill an average of 40 minutes 4 days a week.  Assessment/Plan:   1. Pre-diabetes Joseph Graves is prescribed Metformin BID but usually only takes it in the morning.   Plan: Consider starting GLP-1 in the near future. Continue Metformin BID. Joseph Graves will call his insurance to see which GLP-1 is approved.  2. Seasonal allergies We changed Joseph Graves's allergy medicine to Astelin nasal spray and Xyzal at his last OV, and he notes it is working better.  Plan: Continue Xyzal, Singulair, Flonase, and Astelin nasal spray. Preventative strategies discussed with Joseph Graves. He declines the need for refills today.  3. Class 3 severe obesity with serious comorbidity and body mass index (BMI) of 40.0 to 44.9 in adult, unspecified obesity type (HCC) Joseph Graves is currently in the action stage of change. As such, his goal is to continue with weight loss efforts. He has agreed to the Category 4 Plan.   Exercise goals: For substantial health benefits, adults should do at least 150 minutes (2 hours and 30 minutes) a week of moderate-intensity, or 75 minutes (1 hour and 15 minutes) a week of  vigorous-intensity aerobic physical activity, or an equivalent combination of moderate- and vigorous-intensity aerobic activity. Aerobic activity should be performed in episodes of at least 10 minutes, and preferably, it should be spread throughout the week.  Behavioral modification strategies: increasing lean protein intake, decreasing simple carbohydrates, decreasing liquid calories (bourban, etc), meal planning and cooking strategies, holiday eating strategies  and avoiding temptations.  Joseph Graves has agreed to follow-up with our clinic in 2 weeks. He was informed of the importance of frequent follow-up visits to maximize his success with intensive lifestyle modifications for his multiple health conditions.   Objective:   Blood pressure 119/78, pulse 63, temperature 97.6 F (36.4 C), height 6\' 4"  (1.93 m), weight (!) 336 lb (152.4 kg), SpO2 99 %. Body mass index is 40.9 kg/m.  General: Cooperative, alert, well developed, in no acute distress. HEENT: Conjunctivae and lids unremarkable. Cardiovascular: Regular rhythm.  Lungs: Normal work of breathing. Neurologic: No focal deficits.   Lab Results  Component Value Date   CREATININE 0.96 08/01/2020   BUN 16 08/01/2020   NA 141 08/01/2020   K 4.4 08/01/2020   CL 103 08/01/2020   CO2 25 08/01/2020   Lab Results  Component Value Date   ALT 22 08/01/2020   AST 17 08/01/2020   ALKPHOS 73 08/01/2020   BILITOT 0.6 08/01/2020   Lab Results  Component Value Date   HGBA1C 5.6 08/01/2020   HGBA1C 5.3 02/14/2020   HGBA1C 5.8 (H) 10/19/2019   HGBA1C 5.5 06/07/2018   HGBA1C 5.5 04/27/2017   Lab  Results  Component Value Date   INSULIN 31.0 (H) 08/01/2020   INSULIN 14.8 02/14/2020   INSULIN 41.0 (H) 10/19/2019   Lab Results  Component Value Date   TSH 2.130 08/01/2020   Lab Results  Component Value Date   CHOL 131 08/01/2020   HDL 52 08/01/2020   LDLCALC 63 08/01/2020   TRIG 83 08/01/2020   CHOLHDL 3.6 06/07/2018   Lab Results   Component Value Date   WBC 6.1 06/07/2018   HGB 14.2 06/07/2018   HCT 42.4 06/07/2018   MCV 90 06/07/2018   PLT 311 06/07/2018    Attestation Statements:   Reviewed by clinician on day of visit: allergies, medications, problem list, medical history, surgical history, family history, social history, and previous encounter notes.  Time spent on visit including pre-visit chart review and post-visit care and charting was 30 minutes.   Edmund Hilda, am acting as Energy manager for [employer], DO.  I have reviewed the above documentation for accuracy and completeness, and I agree with the above. Carlye Grippe, D.O.  The 21st Century Cures Act was signed into law in 2016 which includes the topic of electronic health records.  This provides immediate access to information in MyChart.  This includes consultation notes, operative notes, office notes, lab results and pathology reports.  If you have any questions about what you read please let us know at your next visit so we can discuss your concerns and take corrective action if need be.  We are right here with you.

## 2020-09-17 MED FILL — TADALAFIL 20 MG TABS: 20 | 30 days supply | Qty: 6 | Fill #1

## 2020-10-03 ENCOUNTER — Encounter (INDEPENDENT_AMBULATORY_CARE_PROVIDER_SITE_OTHER): Payer: Self-pay

## 2020-10-04 ENCOUNTER — Encounter (INDEPENDENT_AMBULATORY_CARE_PROVIDER_SITE_OTHER): Payer: Self-pay | Admitting: Family Medicine

## 2020-10-04 ENCOUNTER — Telehealth (INDEPENDENT_AMBULATORY_CARE_PROVIDER_SITE_OTHER): Payer: 59 | Admitting: Family Medicine

## 2020-10-04 ENCOUNTER — Other Ambulatory Visit (INDEPENDENT_AMBULATORY_CARE_PROVIDER_SITE_OTHER): Payer: Self-pay | Admitting: Family Medicine

## 2020-10-04 ENCOUNTER — Other Ambulatory Visit: Payer: Self-pay

## 2020-10-04 DIAGNOSIS — Z9189 Other specified personal risk factors, not elsewhere classified: Secondary | ICD-10-CM

## 2020-10-04 DIAGNOSIS — Z6841 Body Mass Index (BMI) 40.0 and over, adult: Secondary | ICD-10-CM

## 2020-10-04 DIAGNOSIS — R7303 Prediabetes: Secondary | ICD-10-CM | POA: Diagnosis not present

## 2020-10-04 MED ORDER — METFORMIN HCL 500 MG PO TABS: 500.0000 mg | ORAL_TABLET | Freq: Two times a day (BID) | ORAL | 0 refills | Status: DC

## 2020-10-04 MED FILL — METFORMIN HCL 500 MG TABS: 500 | 30 days supply | Qty: 60 | Fill #0

## 2020-10-08 NOTE — Progress Notes (Signed)
TeleHealth Visit:  Due to the COVID-19 pandemic, this visit was completed with telemedicine (audio/video) technology to reduce patient and provider exposure as well as to preserve personal protective equipment.   Joseph Graves has verbally consented to this TeleHealth visit. The patient is located at home, the provider is located at the [facility] office. The participants in this visit include the listed provider and patient. The visit was conducted today via video.  Chief Complaint: OBESITY Joseph Graves is here to discuss his progress with his obesity treatment plan along with follow-up of his obesity related diagnoses. [name] is on the Category 4 Plan and practicing portion control and making smarter food choices, such as increasing vegetables and decreasing simple carbohydrates and states he is following his eating plan approximately 0% of the time. Marcus states he is exercising 0 minutes 0 times per week.  Today's visit was #: 19 Starting weight: 364 lbs Starting date: 10/19/2019  Interim History: Joseph Graves has been working from home the past couple of weeks. He does not own a treadmill, thus hasn't been moving much purposefully much lately, especially with working at home. He thinks he maintained over the holidays and his birthday. Patient feels journaling keeps him accountable more and wishes to change back to that.  Assessment/Plan:   1. Pre-diabetes [name] has a diagnosis of prediabetes based on his elevated HgA1c and was informed this puts him at greater risk of developing diabetes. He continues to work on diet and exercise to decrease his risk of diabetes. He denies nausea or hypoglycemia. He is prescribed Metformin.  Lab Results  Component Value Date   HGBA1C 5.6 08/01/2020   Lab Results  Component Value Date   INSULIN 31.0 (H) 08/01/2020   INSULIN 14.8 02/14/2020   INSULIN 41.0 (H) 10/19/2019   Plan: Refill Metformin for 1 month, as per below. Joseph Graves will continue to  work on weight loss, exercise, and decreasing simple carbohydrates to help decrease the risk of diabetes.   Refill- metFORMIN (GLUCOPHAGE) 500 MG tablet; Take 1 tablet (500 mg total) by mouth 2 (two) times daily with a meal.  Dispense: 60 tablet; Refill: 0  2. At risk for impaired metabolic function [name] was given approximately 15 minutes of impaired  metabolic function prevention counseling today. We discussed intensive lifestyle modifications today with an emphasis on specific nutrition and exercise instructions and strategies.   3. Class 3 severe obesity with serious comorbidity and body mass index (BMI) of 40.0 to 44.9 in adult, unspecified obesity type (HCC) Joseph Graves is currently in the action stage of change. As such, his goal is to continue with weight loss efforts. He has agreed to change to  keeping a food journal and adhering to recommended goals of 2200 calories and 120 g protein using "Lose It".   Exercise goals: All adults should avoid inactivity. Some physical activity is better than none, and adults who participate in any amount of physical activity gain some health benefits.  Behavioral modification strategies: meal planning and cooking strategies, keeping healthy foods in the home and planning for success.  Joseph Graves has agreed to follow-up with our clinic in 3 weeks on Feb 7th at 0720. He was informed of the importance of frequent follow-up visits to maximize his success with intensive lifestyle modifications for his multiple health conditions.  Objective:   VITALS: Per patient if applicable, see vitals. GENERAL: Alert and in no acute distress. CARDIOPULMONARY: No increased WOB. Speaking in clear sentences.  PSYCH: Pleasant and cooperative. Speech  normal rate and rhythm. Affect is appropriate. Insight and judgement are appropriate. Attention is focused, linear, and appropriate.  NEURO: Oriented as arrived to appointment on time with no prompting.   Lab Results  Component Value  Date   CREATININE 0.96 08/01/2020   BUN 16 08/01/2020   NA 141 08/01/2020   K 4.4 08/01/2020   CL 103 08/01/2020   CO2 25 08/01/2020   Lab Results  Component Value Date   ALT 22 08/01/2020   AST 17 08/01/2020   ALKPHOS 73 08/01/2020   BILITOT 0.6 08/01/2020   Lab Results  Component Value Date   HGBA1C 5.6 08/01/2020   HGBA1C 5.3 02/14/2020   HGBA1C 5.8 (H) 10/19/2019   HGBA1C 5.5 06/07/2018   HGBA1C 5.5 04/27/2017   Lab Results  Component Value Date   INSULIN 31.0 (H) 08/01/2020   INSULIN 14.8 02/14/2020   INSULIN 41.0 (H) 10/19/2019   Lab Results  Component Value Date   TSH 2.130 08/01/2020   Lab Results  Component Value Date   CHOL 131 08/01/2020   HDL 52 08/01/2020   LDLCALC 63 08/01/2020   TRIG 83 08/01/2020   CHOLHDL 3.6 06/07/2018   Lab Results  Component Value Date   WBC 6.1 06/07/2018   HGB 14.2 06/07/2018   HCT 42.4 06/07/2018   MCV 90 06/07/2018   PLT 311 06/07/2018   No results found for: IRON, TIBC, FERRITIN  Attestation Statements:   Reviewed by clinician on day of visit: allergies, medications, problem list, medical history, surgical history, family history, social history, and previous encounter notes.  Edmund Hilda, am acting as Energy manager for [employer], DO.  I have reviewed the above documentation for accuracy and completeness, and I agree with the above. Carlye Grippe, D.O.  The 21st Century Cures Act was signed into law in 2016 which includes the topic of electronic health records.  This provides immediate access to information in MyChart.  This includes consultation notes, operative notes, office notes, lab results and pathology reports.  If you have any questions about what you read please let us know at your next visit so we can discuss your concerns and take corrective action if need be.  We are right here with you.

## 2020-10-14 DIAGNOSIS — Z20822 Contact with and (suspected) exposure to covid-19: Secondary | ICD-10-CM | POA: Diagnosis not present

## 2020-10-29 ENCOUNTER — Encounter (INDEPENDENT_AMBULATORY_CARE_PROVIDER_SITE_OTHER): Payer: Self-pay | Admitting: Family Medicine

## 2020-10-29 ENCOUNTER — Other Ambulatory Visit: Payer: Self-pay

## 2020-10-29 ENCOUNTER — Other Ambulatory Visit (INDEPENDENT_AMBULATORY_CARE_PROVIDER_SITE_OTHER): Payer: Self-pay | Admitting: Family Medicine

## 2020-10-29 ENCOUNTER — Ambulatory Visit (INDEPENDENT_AMBULATORY_CARE_PROVIDER_SITE_OTHER): Payer: 59 | Admitting: Family Medicine

## 2020-10-29 ENCOUNTER — Ambulatory Visit (INDEPENDENT_AMBULATORY_CARE_PROVIDER_SITE_OTHER): Payer: Self-pay | Admitting: Family Medicine

## 2020-10-29 VITALS — BP 117/68 | HR 62 | Temp 97.8°F | Ht 76.0 in | Wt 338.0 lb

## 2020-10-29 DIAGNOSIS — Z9189 Other specified personal risk factors, not elsewhere classified: Secondary | ICD-10-CM | POA: Insufficient documentation

## 2020-10-29 DIAGNOSIS — R7303 Prediabetes: Secondary | ICD-10-CM | POA: Diagnosis not present

## 2020-10-29 DIAGNOSIS — Z6841 Body Mass Index (BMI) 40.0 and over, adult: Secondary | ICD-10-CM | POA: Diagnosis not present

## 2020-10-29 MED ORDER — RYBELSUS 3 MG PO TABS
ORAL_TABLET | ORAL | 0 refills | Status: DC
Start: 2020-10-29 — End: 2020-11-19

## 2020-10-29 MED FILL — RYBELSUS 3 MG TABS: 3 | 30 days supply | Qty: 30 | Fill #0

## 2020-10-31 NOTE — Progress Notes (Signed)
Chief Complaint:   OBESITY Joseph Graves is here to discuss his progress with his obesity treatment plan along with follow-up of his obesity related diagnoses.   Today's visit was #: 20 Starting weight: 364 lbs Starting date: 10/19/2019 Today's weight: 338 lbs Today's date: 10/29/2020 Total lbs lost to date: 26 lbs Body mass index is 41.14 kg/m.  Total weight loss percentage to date: -7.14%  Interim History: Joseph Graves says he has no issues with the plan.  Plan:  Starting back to work this week and no longer working from home.  Nutrition Plan: keeping a food journal and adhering to recommended goals of 2200 calories and 120 grams of protein for 0% of the time. Activity: None at this time. This patient is following the prescribed meal plan meal without concerns.  Food recall appears to be consistent with the prescribed plan.  When following the plan, hunger and cravings are well controlled.    Assessment/Plan:   Medications Discontinued During This Encounter  Medication Reason  . metFORMIN (GLUCOPHAGE) 500 MG tablet      Meds ordered this encounter  Medications  . Semaglutide (RYBELSUS) 3 MG TABS    Sig: 1 po qd    Dispense:  30 tablet    Refill:  0     1. Prediabetes Goal is HgbA1c < 5.7.  Medication: metformin 500 mg twice daily.  He has had a 5 pound weight gain since 08/22/2020.  He is failing OP treatment with metformin.   Plan:  Discontinue metformin and start Rybelsus 3 mg daily, as per below.  He will continue to focus on protein-rich, low simple carbohydrate foods. We reviewed the importance of hydration, regular exercise for stress reduction, and restorative sleep.   Lab Results  Component Value Date   HGBA1C 5.6 08/01/2020   Lab Results  Component Value Date   INSULIN 31.0 (H) 08/01/2020   INSULIN 14.8 02/14/2020   INSULIN 41.0 (H) 10/19/2019   - Start Semaglutide (RYBELSUS) 3 MG TABS; 1 po qd  Dispense: 30 tablet; Refill: 0  2. At risk for activity  intolerance Joseph Graves was given approximately 10 minutes of counseling today regarding his increased risk for exercise intolerance.  We discussed patient's specific personal and medical issues that raise our concern.  He was advised of strategies to prevent injury and ways to improve his cardiopulmonary fitness levels slowly over time.  We additionally discussed various fitness trackers and smart phone apps to help motivate the patient to stay on track.   3. Class 3 severe obesity with serious comorbidity and body mass index (BMI) of 40.0 to 44.9 in adult, unspecified obesity type (HCC)  Course: Joseph Graves is currently in the action stage of change. As such, his goal is to continue with weight loss efforts.   Nutrition goals: He has agreed to keeping a food journal and adhering to recommended goals of 2200 calories and 120 grams of protein.   Exercise goals: All adults should avoid inactivity. Some physical activity is better than none, and adults who participate in any amount of physical activity gain some health benefits.  Behavioral modification strategies: meal planning and cooking strategies, keeping healthy foods in the home and planning for success.  Joseph Graves has agreed to follow-up with our clinic in 3 weeks. He was informed of the importance of frequent follow-up visits to maximize his success with intensive lifestyle modifications for his multiple health conditions.   Objective:   Blood pressure 117/68, pulse 62, temperature 97.8 F (36.6  C), height 6\' 4"  (1.93 m), weight (!) 338 lb (153.3 kg), SpO2 99 %. Body mass index is 41.14 kg/m.  General: Cooperative, alert, well developed, in no acute distress. HEENT: Conjunctivae and lids unremarkable. Cardiovascular: Regular rhythm.  Lungs: Normal work of breathing. Neurologic: No focal deficits.   Lab Results  Component Value Date   CREATININE 0.96 08/01/2020   BUN 16 08/01/2020   NA 141 08/01/2020   K 4.4 08/01/2020   CL 103 08/01/2020    CO2 25 08/01/2020   Lab Results  Component Value Date   ALT 22 08/01/2020   AST 17 08/01/2020   ALKPHOS 73 08/01/2020   BILITOT 0.6 08/01/2020   Lab Results  Component Value Date   HGBA1C 5.6 08/01/2020   HGBA1C 5.3 02/14/2020   HGBA1C 5.8 (H) 10/19/2019   HGBA1C 5.5 06/07/2018   HGBA1C 5.5 04/27/2017   Lab Results  Component Value Date   INSULIN 31.0 (H) 08/01/2020   INSULIN 14.8 02/14/2020   INSULIN 41.0 (H) 10/19/2019   Lab Results  Component Value Date   TSH 2.130 08/01/2020   Lab Results  Component Value Date   CHOL 131 08/01/2020   HDL 52 08/01/2020   LDLCALC 63 08/01/2020   TRIG 83 08/01/2020   CHOLHDL 3.6 06/07/2018   Lab Results  Component Value Date   WBC 6.1 06/07/2018   HGB 14.2 06/07/2018   HCT 42.4 06/07/2018   MCV 90 06/07/2018   PLT 311 06/07/2018   Attestation Statements:   Reviewed by clinician on day of visit: allergies, medications, problem list, medical history, surgical history, family history, social history, and previous encounter notes.  I, 06/09/2018, CMA, am acting as Insurance claims handler for Energy manager, DO.  I have reviewed the above documentation for accuracy and completeness, and I agree with the above. [employer], D.O.  The 21st Century Cures Act was signed into law in 2016 which includes the topic of electronic health records.  This provides immediate access to information in MyChart.  This includes consultation notes, operative notes, office notes, lab results and pathology reports.  If you have any questions about what you read please let 2017 know at your next visit so we can discuss your concerns and take corrective action if need be.  We are right here with you.

## 2020-11-07 ENCOUNTER — Other Ambulatory Visit: Payer: Self-pay

## 2020-11-07 ENCOUNTER — Other Ambulatory Visit: Payer: Self-pay | Admitting: Physician Assistant

## 2020-11-07 ENCOUNTER — Encounter: Payer: Self-pay | Admitting: Physician Assistant

## 2020-11-07 ENCOUNTER — Ambulatory Visit (INDEPENDENT_AMBULATORY_CARE_PROVIDER_SITE_OTHER): Payer: 59 | Admitting: Physician Assistant

## 2020-11-07 VITALS — BP 113/71 | HR 74 | Temp 97.1°F | Ht 76.0 in | Wt 340.4 lb

## 2020-11-07 DIAGNOSIS — J302 Other seasonal allergic rhinitis: Secondary | ICD-10-CM

## 2020-11-07 DIAGNOSIS — E349 Endocrine disorder, unspecified: Secondary | ICD-10-CM

## 2020-11-07 DIAGNOSIS — Z0000 Encounter for general adult medical examination without abnormal findings: Secondary | ICD-10-CM | POA: Diagnosis not present

## 2020-11-07 DIAGNOSIS — F3289 Other specified depressive episodes: Secondary | ICD-10-CM | POA: Diagnosis not present

## 2020-11-07 DIAGNOSIS — I10 Essential (primary) hypertension: Secondary | ICD-10-CM

## 2020-11-07 DIAGNOSIS — J3089 Other allergic rhinitis: Secondary | ICD-10-CM | POA: Diagnosis not present

## 2020-11-07 DIAGNOSIS — Z1211 Encounter for screening for malignant neoplasm of colon: Secondary | ICD-10-CM | POA: Diagnosis not present

## 2020-11-07 DIAGNOSIS — R200 Anesthesia of skin: Secondary | ICD-10-CM

## 2020-11-07 DIAGNOSIS — N521 Erectile dysfunction due to diseases classified elsewhere: Secondary | ICD-10-CM

## 2020-11-07 DIAGNOSIS — H1012 Acute atopic conjunctivitis, left eye: Secondary | ICD-10-CM | POA: Diagnosis not present

## 2020-11-07 MED ORDER — TADALAFIL 20 MG PO TABS: 10.0000 mg | ORAL_TABLET | Freq: Every day | ORAL | 1 refills | Status: DC | PRN

## 2020-11-07 MED ORDER — LOSARTAN POTASSIUM 100 MG PO TABS: 100.0000 mg | ORAL_TABLET | Freq: Every day | ORAL | 1 refills | Status: DC

## 2020-11-07 MED ORDER — AZELASTINE HCL 0.1 % NA SOLN: 1.0000 | Freq: Two times a day (BID) | NASAL | 0 refills | Status: DC

## 2020-11-07 MED ORDER — CROMOLYN SODIUM 4 % OP SOLN: 2.0000 [drp] | Freq: Four times a day (QID) | OPHTHALMIC | 1 refills | Status: DC

## 2020-11-07 MED ORDER — BUPROPION HCL ER (XL) 150 MG PO TB24: 300.0000 mg | ORAL_TABLET | Freq: Every day | ORAL | 1 refills | Status: DC

## 2020-11-07 MED ORDER — HYDROCHLOROTHIAZIDE 12.5 MG PO CAPS: 12.5000 mg | ORAL_CAPSULE | Freq: Every day | ORAL | 1 refills | Status: DC

## 2020-11-07 MED ORDER — MONTELUKAST SODIUM 10 MG PO TABS
10.0000 mg | ORAL_TABLET | Freq: Every day | ORAL | 1 refills | Status: DC
Start: 2020-11-07 — End: 2020-11-07

## 2020-11-07 MED ORDER — FLUTICASONE PROPIONATE 50 MCG/ACT NA SUSP
2.0000 | Freq: Every day | NASAL | 1 refills | Status: DC
Start: 2020-11-07 — End: 2020-11-07

## 2020-11-07 MED FILL — TADALAFIL 20 MG TABS: 20 | 30 days supply | Qty: 6 | Fill #0

## 2020-11-07 MED FILL — LOSARTAN POTASSIUM 100 MG T: 100 | 90 days supply | Qty: 90 | Fill #0

## 2020-11-07 MED FILL — FLUTICASONE PROP 50 MCG SPR: 50 | 30 days supply | Qty: 16 | Fill #0

## 2020-11-07 MED FILL — AZELASTINE HCL 137 MCG/SPRA: 137 | 30 days supply | Qty: 30 | Fill #0

## 2020-11-07 NOTE — Progress Notes (Signed)
Male physical   Impression and Recommendations:    1. Routine general medical examination at a health care facility   2. Seasonal allergies   3. Other depression, with emotional eatimg   4. Environmental and seasonal allergies   5. Essential hypertension   6. Erectile dysfunction due to diseases classified elsewhere   7. Testosterone insufficiency-  was 221, 248 in 2013   8. Screening for colon cancer   9. Allergic conjunctivitis of left eye   10. Lower extremity numbness      1) Anticipatory Guidance: Skin CA prevention- recommend to use sunscreen when outside along with skin surveillance; eating a balanced and modest diet; physical activity at least 25 minutes per day or minimum of 150 min/ week moderate to intense activity.  2) Immunizations / Screenings / Labs:   All immunizations are up-to-date per recommendations or will be updated today if pt allows.    - Patient understands with dental and vision screens they will schedule independently.  - Will obtain CBC, CMP, HgA1c, Lipid panel, TSH and vit D when fasting, if not already done past 12 mo/ recently. Discussed with patient updating testosterone levels and plans to get done with [facility] since labs should be checked early in the morning.  -UTD on Tdap, Hep C and HIV screening, influenza and covid vaccine. Declined Shingrix. Agreeable to Cologuard.   3) Weight:  Continue to improve diet habits to improve overall feelings of well being and objective health data. Improve nutrient density of diet through increasing intake of fruits and vegetables and decreasing saturated fats, white flour products and refined sugars.   4) Healthcare Maintenance: -Continue with weight loss efforts and weight loss program with [facility].  -Increase physical activity. -Stay well hydrated. -Will place order for nerve conduction study to evaluate lower extremity numbness.  -Patient has applied topical  erythromycin and used Pataday drops with mild relief. Will send rx for cromolyn opthalmic drops.  -Follow up in 6 months for HTN or sooner if needed  Orders Placed This Encounter  Procedures  . Cologuard  . NCV with EMG(electromyography)    Standing Status:   Future    Standing Expiration Date:   11/07/2021    Meds ordered this encounter  Medications  . azelastine (ASTELIN) 0.1 % nasal spray    Sig: Place 1 spray into both nostrils 2 (two) times daily. 1 spray each nostril twice daily after sinus rinses    Dispense:  30 mL    Refill:  0  . buPROPion (WELLBUTRIN XL) 150 MG 24 hr tablet    Sig: Take 2 tablets (300 mg total) by mouth daily.    Dispense:  180 tablet    Refill:  1  . fluticasone (FLONASE) 50 MCG/ACT nasal spray    Sig: Place 2 sprays into both nostrils daily.    Dispense:  15.8 g    Refill:  1  . hydrochlorothiazide (MICROZIDE) 12.5 MG capsule    Sig: Take 1 capsule (12.5 mg total) by mouth daily.    Dispense:  90 capsule    Refill:  1  . losartan (COZAAR) 100 MG tablet    Sig: Take 1 tablet (100 mg total) by mouth daily.    Dispense:  90 tablet    Refill:  1  . montelukast (SINGULAIR) 10 MG tablet    Sig: Take 1 tablet (10 mg total) by mouth at bedtime.    Dispense:  90 tablet  Refill:  1  . tadalafil (CIALIS) 20 MG tablet    Sig: Take 0.5-1 tablets (10-20 mg total) by mouth daily as needed for erectile dysfunction. 1 hour prior    Dispense:  6 tablet    Refill:  1  . cromolyn (OPTICROM) 4 % ophthalmic solution    Sig: Place 2 drops into the left eye 4 (four) times daily.    Dispense:  10 mL    Refill:  1    Order Specific Question:   Supervising Provider    Answer:   Nani Gasser D [[id number]]     Return in 6 months (on 05/07/2021) for HTN, Wt, Mood.    Gross side effects, risk and benefits, and alternatives of medications discussed with patient.  Patient is aware that all medications have potential side effects and we are unable to predict every  side effect or drug-drug interaction that may occur.  Expresses verbal understanding and consents to current therapy plan and treatment regimen.  Please see AVS handed out to patient at the end of our visit for further patient instructions/ counseling done pertaining to today's office visit.     Subjective:       CC: CPE   HPI: Joseph Graves is a 50 y.o. male who presents to [facility] today for a yearly health maintenance exam.     Health Maintenance Summary  - Reviewed and updated, unless pt declines services.  Last Cologuard or Colonoscopy:   Declined screening colonoscopy. Agreeable Cologuard.  Family history of Colon CA: No  Tobacco History Reviewed:  Y, former smoker Abdominal Ultrasound:   N/A CT scan for screening lung CA:   N/A Alcohol / drug use:    No concerns, no excessive use / no use Exercise Habits:  Currently not as active as before. Dental Home:Y every so often   Eye exams: Y about every 51 years Male history: STD concerns:   none Additional penile/ urinary concerns: none   Additional concerns beyond Health Maintenance issues:   LE numbness x 1 year.     Immunization History  Administered Date(s) Administered  . Influenza Split 08/08/2005  . Influenza Whole 06/08/2018  . Influenza, Seasonal, Injecte, Preservative Fre 06/14/2015  . Influenza-Unspecified 06/12/2019, 06/18/2020  . Tdap 04/19/2015     Health Maintenance  Topic Date Due  . COVID-19 Vaccine (1) Never done  . COLONOSCOPY (Pts 45-92yrs Insurance coverage will need to be confirmed)  Never done  . TETANUS/TDAP  04/18/2025  . INFLUENZA VACCINE  Completed  . Hepatitis C Screening  Completed  . HIV Screening  Completed       Wt Readings from Last 3 Encounters:  11/07/20 (!) 340 lb 6.4 oz (154.4 kg)  10/29/20 (!) 338 lb (153.3 kg)  09/11/20 (!) 336 lb (152.4 kg)   BP Readings from Last 3 Encounters:  11/07/20 113/71  10/29/20 117/68  09/11/20 119/78    Pulse Readings from Last 3 Encounters:  11/07/20 74  10/29/20 62  09/11/20 63    Patient Active Problem List   Diagnosis Date Noted  . At risk for activity intolerance 10/29/2020  . Seasonal allergies 09/11/2020  . At risk for diabetes mellitus 08/22/2020  . Essential hypertension 08/01/2020  . Low testosterone 08/01/2020  . Adjustment disorder with mixed anxiety and depressed mood 08/01/2020  . Nonalcoholic hepatosteatosis 08/01/2020  . Low testosterone in male 08/01/2020  . At risk for impaired metabolic function 08/01/2020  . Insulin resistance  03/06/2020  . B12 deficiency 02/14/2020  . Prediabetes 10/20/2019  . Low HDL (under 40) 10/20/2019  . Elevated ALT measurement 10/20/2019  . Erectile dysfunction due to diseases classified elsewhere 09/26/2019  . Testosterone insufficiency 09/26/2019  . History of non anemic vitamin B12 deficiency 06/07/2018  . Depression 03/02/2018  . Hypertension 04/27/2017  . GERD (gastroesophageal reflux disease) 04/27/2017  . Environmental and seasonal allergies 04/27/2017  . Vitamin D deficiency 04/27/2017  . Adjustment disorder with depressed mood in remission 04/27/2017  . Class 2 severe obesity with serious comorbidity and body mass index (BMI) of 37.0 to 37.9 in adult (HCC) 04/27/2017  . Numbness and tingling of both feet 04/27/2017  . OSA on CPAP 04/27/2017  . Family history of suicide- father age 63 04/27/2017  . Family history of alcoholism in grandfathers 04/27/2017  . Family history of drug addiction 04/27/2017  . Family history of depression 04/27/2017  . Family history of diet-controlled diabetes- father in 56s 04/27/2017  . Family history of non-Hodgkin's lymphoma- father 04/27/2017  . Erectile dysfunction 04/19/2015    Past Medical History:  Diagnosis Date  . Allergy   . Anxiety   . Asthma    outgrew at 41-41 years of age.   . Depression   . Edema of both feet   . GERD (gastroesophageal reflux disease)   .  Hypertension   . Lactose intolerance   . Numbness in feet   . Sleep apnea     Past Surgical History:  Procedure Laterality Date  . BICEPS TENDON REPAIR  11/2013  . NASAL SINUS SURGERY    . VASECTOMY      Family History  Problem Relation Age of Onset  . Stroke Mother   . Allergic rhinitis Mother   . Sleep apnea Mother   . Obesity Mother   . Bipolar disorder Mother   . Depression Father        suicide  . Cancer Father   . Diabetes Father   . Allergic rhinitis Father   . Anxiety disorder Father   . Diabetes Paternal Uncle   . Alcohol abuse Maternal Grandfather   . Hypertension Maternal Grandfather   . Alcohol abuse Paternal Grandfather     Social History   Substance and Sexual Activity  Drug Use No  ,  Social History   Substance and Sexual Activity  Alcohol Use Yes  . Alcohol/week: 1.0 standard drink  . Types: 1 Cans of beer per week  ,  Social History   Tobacco Use  Smoking Status Former Smoker  . Packs/day: 0.25  . Years: 4.50  . Pack years: 1.12  . Quit date: 60  . Years since quitting: 30.1  Smokeless Tobacco Never Used  ,  Social History   Substance and Sexual Activity  Sexual Activity Yes    Patient's Medications  New Prescriptions   CROMOLYN (OPTICROM) 4 % OPHTHALMIC SOLUTION    Place 2 drops into the left eye 4 (four) times daily.  Previous Medications   CHOLECALCIFEROL (VITAMIN D3) 125 MCG (5000 UT) CAPS    5000 IU Monday-Friday and 10,000 IU on Saturday and Sunday.   ERYTHROMYCIN OPHTHALMIC OINTMENT    Place a 1/2 inch ribbon of ointment into the lower eyelid.   LEVOCETIRIZINE (XYZAL) 5 MG TABLET    Take 1 tablet (5 mg total) by mouth every evening.   OMEGA-3 FATTY ACIDS (FISH OIL) 1200 MG CAPS    Take 1,200 mg by mouth daily.    OVER  THE COUNTER MEDICATION    Take 1 tablet by mouth daily. [employer] Mega Men Prostate and Virility Supplement   SEMAGLUTIDE (RYBELSUS) 3 MG TABS    1 po qd  Modified Medications   Modified Medication Previous  Medication   AZELASTINE (ASTELIN) 0.1 % NASAL SPRAY azelastine (ASTELIN) 0.1 % nasal spray      Place 1 spray into both nostrils 2 (two) times daily. 1 spray each nostril twice daily after sinus rinses    Place 1 spray into both nostrils 2 (two) times daily. 1 spray each nostril twice daily after sinus rinses   BUPROPION (WELLBUTRIN XL) 150 MG 24 HR TABLET buPROPion (WELLBUTRIN XL) 150 MG 24 hr tablet      Take 2 tablets (300 mg total) by mouth daily.    Take 2 tablets (300 mg total) by mouth daily.   FLUTICASONE (FLONASE) 50 MCG/ACT NASAL SPRAY fluticasone (FLONASE) 50 MCG/ACT nasal spray      Place 2 sprays into both nostrils daily.    Place 2 sprays into both nostrils daily.   HYDROCHLOROTHIAZIDE (MICROZIDE) 12.5 MG CAPSULE hydrochlorothiazide (MICROZIDE) 12.5 MG capsule      Take 1 capsule (12.5 mg total) by mouth daily.    Take 1 capsule (12.5 mg total) by mouth daily.   LOSARTAN (COZAAR) 100 MG TABLET losartan (COZAAR) 100 MG tablet      Take 1 tablet (100 mg total) by mouth daily.    Take 1 tablet (100 mg total) by mouth daily.   MONTELUKAST (SINGULAIR) 10 MG TABLET montelukast (SINGULAIR) 10 MG tablet      Take 1 tablet (10 mg total) by mouth at bedtime.    Take 1 tablet (10 mg total) by mouth at bedtime.   TADALAFIL (CIALIS) 20 MG TABLET tadalafil (CIALIS) 20 MG tablet      Take 0.5-1 tablets (10-20 mg total) by mouth daily as needed for erectile dysfunction. 1 hour prior    Take 0.5-1 tablets (10-20 mg total) by mouth daily as needed for erectile dysfunction. 1 hour prior  Discontinued Medications   No medications on file    Amoxicillin-pot clavulanate and Penicillins  Review of Systems: General:   Denies fever, chills, unexplained weight loss.  Optho/Auditory:   Denies visual changes, blurred vision/LOV Respiratory:   Denies SOB, DOE more than baseline levels.   Cardiovascular:   Denies chest pain, palpitations, new onset peripheral edema  Gastrointestinal:   Denies nausea,  vomiting, diarrhea.  Genitourinary: Denies dysuria, freq/ urgency, flank pain  Endocrine:     Denies hot or cold intolerance, polyuria, polydipsia. Musculoskeletal:   Denies unexplained myalgias, joint swelling, unexplained arthralgias, gait problems.  Skin:  Denies rash, suspicious lesions Neurological:     Denies dizziness, unexplained weakness, +numbness  Psychiatric/Behavioral:   Denies mood changes, suicidal or homicidal ideations, hallucinations    Objective:     Blood pressure 113/71, pulse 74, temperature (!) 97.1 F (36.2 C), height  (1.93 m), weight (!) 340 lb 6.4 oz (154.4 kg), SpO2 98 %. Body mass index is 41.43 kg/m. General Appearance:    Alert, cooperative, no distress, appears stated age  Head:    Normocephalic, without obvious abnormality, atraumatic  Eyes:    PERRL, EOM's intact of both eyes; conjunctiva, upper and lower eyelids mildly erythematous of left eye  Ears:    Normal TM's and external ear canals, both ears  Nose:   Nares normal, septum midline, mucosa normal, no drainage    or sinus tenderness  Throat:   Lips w/o lesion, mucosa moist, and tongue normal; teeth and gums normal  Neck:   Supple, symmetrical, trachea midline, no adenopathy;    thyroid:  no enlargement/tenderness/nodules; no JVD  Back:     Symmetric, no curvature, ROM normal, no CVA tenderness  Lungs:     Clear to auscultation bilaterally, respirations unlabored, no  Wh/ R/ R  Chest Wall:    No tenderness or gross deformity; normal excursion   Heart:    Regular rate and rhythm, S1 and S2 normal, no murmur, rub   or gallop  Abdomen:     Soft, non-tender, bowel sounds active all four quadrants, No G/R/R, no masses, no organomegaly  Genitalia:    Deferred. No concerns/sxs.  Rectal:    Deferred. Placed order for Cologuard.  Extremities:   Extremities normal, atraumatic, no cyanosis or gross edema, spider veins of LE noted  Pulses:   2+ and symmetric all extremities  Skin:   Warm, dry, Skin  color, texture, turgor normal, no obvious rashes or lesions  M-Sk:   Ambulates * 4 w/o difficulty, no gross deformities, tone WNL  Neurologic:   CNII-XII grossly intact, normal strength, sensation and reflexes    Throughout Psych:  No HI/SI, judgement and insight good, Euthymic mood. Full Affect.

## 2020-11-07 NOTE — Patient Instructions (Signed)
 Health Maintenance, Male Adopting a healthy lifestyle and getting preventive care are important in promoting health and wellness. Ask your health care provider about:  The right schedule for you to have regular tests and exams.  Things you can do on your own to prevent diseases and keep yourself healthy. What should I know about diet, weight, and exercise? Eat a healthy diet  Eat a diet that includes plenty of vegetables, fruits, low-fat dairy products, and lean protein.  Do not eat a lot of foods that are high in solid fats, added sugars, or sodium.   Maintain a healthy weight Body mass index (BMI) is a measurement that can be used to identify possible weight problems. It estimates body fat based on height and weight. Your health care provider can help determine your BMI and help you achieve or maintain a healthy weight. Get regular exercise Get regular exercise. This is one of the most important things you can do for your health. Most adults should:  Exercise for at least 150 minutes each week. The exercise should increase your heart rate and make you sweat (moderate-intensity exercise).  Do strengthening exercises at least twice a week. This is in addition to the moderate-intensity exercise.  Spend less time sitting. Even light physical activity can be beneficial. Watch cholesterol and blood lipids Have your blood tested for lipids and cholesterol at 51 years of age, then have this test every 5 years. You may need to have your cholesterol levels checked more often if:  Your lipid or cholesterol levels are high.  You are older than 51 years of age.  You are at high risk for heart disease. What should I know about cancer screening? Many types of cancers can be detected early and may often be prevented. Depending on your health history and family history, you may need to have cancer screening at various ages. This may include screening for:  Colorectal cancer.  Prostate  cancer.  Skin cancer.  Lung cancer. What should I know about heart disease, diabetes, and high blood pressure? Blood pressure and heart disease  High blood pressure causes heart disease and increases the risk of stroke. This is more likely to develop in people who have high blood pressure readings, are of African descent, or are overweight.  Talk with your health care provider about your target blood pressure readings.  Have your blood pressure checked: ? Every 3-5 years if you are 18-39 years of age. ? Every year if you are 40 years old or older.  If you are between the ages of 65 and 75 and are a current or former smoker, ask your health care provider if you should have a one-time screening for abdominal aortic aneurysm (AAA). Diabetes Have regular diabetes screenings. This checks your fasting blood sugar level. Have the screening done:  Once every three years after age 45 if you are at a normal weight and have a low risk for diabetes.  More often and at a younger age if you are overweight or have a high risk for diabetes. What should I know about preventing infection? Hepatitis B If you have a higher risk for hepatitis B, you should be screened for this virus. Talk with your health care provider to find out if you are at risk for hepatitis B infection. Hepatitis C Blood testing is recommended for:  Everyone born from 1945 through 1965.  Anyone with known risk factors for hepatitis C. Sexually transmitted infections (STIs)  You should be screened   each year for STIs, including gonorrhea and chlamydia, if: ? You are sexually active and are younger than 51 years of age. ? You are older than 51 years of age and your health care provider tells you that you are at risk for this type of infection. ? Your sexual activity has changed since you were last screened, and you are at increased risk for chlamydia or gonorrhea. Ask your health care provider if you are at risk.  Ask your  health care provider about whether you are at high risk for HIV. Your health care provider may recommend a prescription medicine to help prevent HIV infection. If you choose to take medicine to prevent HIV, you should first get tested for HIV. You should then be tested every 3 months for as long as you are taking the medicine. Follow these instructions at home: Lifestyle  Do not use any products that contain nicotine or tobacco, such as cigarettes, e-cigarettes, and chewing tobacco. If you need help quitting, ask your health care provider.  Do not use street drugs.  Do not share needles.  Ask your health care provider for help if you need support or information about quitting drugs. Alcohol use  Do not drink alcohol if your health care provider tells you not to drink.  If you drink alcohol: ? Limit how much you have to 0-2 drinks a day. ? Be aware of how much alcohol is in your drink. In the [place] one drink equals one 12 oz bottle of beer (355 mL), one 5 oz glass of wine (148 mL), or one 1 oz glass of hard liquor (44 mL). General instructions  Schedule regular health, dental, and eye exams.  Stay current with your vaccines.  Tell your health care provider if: ? You often feel depressed. ? You have ever been abused or do not feel safe at home. Summary  Adopting a healthy lifestyle and getting preventive care are important in promoting health and wellness.  Follow your health care provider's instructions about healthy diet, exercising, and getting tested or screened for diseases.  Follow your health care provider's instructions on monitoring your cholesterol and blood pressure. This information is not intended to replace advice given to you by your health care provider. Make sure you discuss any questions you have with your health care provider. Document Revised: 09/01/2018 Document Reviewed: 09/01/2018 Elsevier Patient Education  2021 [employer] Inc.     Why follow it? Research  shows. . Those who follow the Mediterranean diet have a reduced risk of heart disease  . The diet is associated with a reduced incidence of Parkinson's and Alzheimer's diseases . People following the diet may have longer life expectancies and lower rates of chronic diseases  . The Dietary Guidelines for Americans recommends the Mediterranean diet as an eating plan to promote health and prevent disease  What Is the Mediterranean Diet?  . Healthy eating plan based on typical foods and recipes of Mediterranean-style cooking . The diet is primarily a plant based diet; these foods should make up a majority of meals   Starches - Plant based foods should make up a majority of meals - They are an important sources of vitamins, minerals, energy, antioxidants, and fiber - Choose whole grains, foods high in fiber and minimally processed items  - Typical grain sources include wheat, oats, barley, corn, brown rice, bulgar, farro, millet, polenta, couscous  - Various types of beans include chickpeas, lentils, fava beans, black beans, white beans   Fruits    Veggies - Large quantities of antioxidant rich fruits & veggies; 6 or more servings  - Vegetables can be eaten raw or lightly drizzled with oil and cooked  - Vegetables common to the traditional Mediterranean Diet include: artichokes, arugula, beets, broccoli, brussel sprouts, cabbage, carrots, celery, collard greens, cucumbers, eggplant, kale, leeks, lemons, lettuce, mushrooms, okra, onions, peas, peppers, potatoes, pumpkin, radishes, rutabaga, shallots, spinach, sweet potatoes, turnips, zucchini - Fruits common to the Mediterranean Diet include: apples, apricots, avocados, cherries, clementines, dates, figs, grapefruits, grapes, melons, nectarines, oranges, peaches, pears, pomegranates, strawberries, tangerines  Fats - Replace butter and margarine with healthy oils, such as olive oil, canola oil, and tahini  - Limit nuts to no more than a handful a day  -  Nuts include walnuts, almonds, pecans, pistachios, pine nuts  - Limit or avoid candied, honey roasted or heavily salted nuts - Olives are central to the Mediterranean diet - can be eaten whole or used in a variety of dishes   Meats Protein - Limiting red meat: no more than a few times a month - When eating red meat: choose lean cuts and keep the portion to the size of deck of cards - Eggs: approx. 0 to 4 times a week  - Fish and lean poultry: at least 2 a week  - Healthy protein sources include, chicken, turkey, lean beef, lamb - Increase intake of seafood such as tuna, salmon, trout, mackerel, shrimp, scallops - Avoid or limit high fat processed meats such as sausage and bacon  Dairy - Include moderate amounts of low fat dairy products  - Focus on healthy dairy such as fat free yogurt, skim milk, low or reduced fat cheese - Limit dairy products higher in fat such as whole or 2% milk, cheese, ice cream  Alcohol - Moderate amounts of red wine is ok  - No more than 5 oz daily for women (all ages) and men older than age 65  - No more than 10 oz of wine daily for men younger than 65  Other - Limit sweets and other desserts  - Use herbs and spices instead of salt to flavor foods  - Herbs and spices common to the traditional Mediterranean Diet include: basil, bay leaves, chives, cloves, cumin, fennel, garlic, lavender, marjoram, mint, oregano, parsley, pepper, rosemary, sage, savory, sumac, tarragon, thyme   It's not just a diet, it's a lifestyle:  . The Mediterranean diet includes lifestyle factors typical of those in the region  . Foods, drinks and meals are best eaten with others and savored . Daily physical activity is important for overall good health . This could be strenuous exercise like running and aerobics . This could also be more leisurely activities such as walking, housework, yard-work, or taking the stairs . Moderation is the key; a balanced and healthy diet accommodates most foods  and drinks . Consider portion sizes and frequency of consumption of certain foods   Meal Ideas & Options:  . Breakfast:  o Whole wheat toast or whole wheat English muffins with peanut butter & hard boiled egg o Steel cut oats topped with apples & cinnamon and skim milk  o Fresh fruit: banana, strawberries, melon, berries, peaches  o Smoothies: strawberries, bananas, greek yogurt, peanut butter o Low fat greek yogurt with blueberries and granola  o Egg white omelet with spinach and mushrooms o Breakfast couscous: whole wheat couscous, apricots, skim milk, cranberries  . Sandwiches:  o Hummus and grilled vegetables (peppers, zucchini, squash) on whole wheat   bread   o Grilled chicken on whole wheat pita with lettuce, tomatoes, cucumbers or tzatziki  o Tuna salad on whole wheat bread: tuna salad made with greek yogurt, olives, red peppers, capers, green onions o Garlic rosemary lamb pita: lamb sauted with garlic, rosemary, salt & pepper; add lettuce, cucumber, greek yogurt to pita - flavor with lemon juice and black pepper  . Seafood:  o Mediterranean grilled salmon, seasoned with garlic, basil, parsley, lemon juice and black pepper o Shrimp, lemon, and spinach whole-grain pasta salad made with low fat greek yogurt  o Seared scallops with lemon orzo  o Seared tuna steaks seasoned salt, pepper, coriander topped with tomato mixture of olives, tomatoes, olive oil, minced garlic, parsley, green onions and cappers  . Meats:  o Herbed greek chicken salad with kalamata olives, cucumber, feta  o Red bell peppers stuffed with spinach, bulgur, lean ground beef (or lentils) & topped with feta   o Kebabs: skewers of chicken, tomatoes, onions, zucchini, squash  o Turkey burgers: made with red onions, mint, dill, lemon juice, feta cheese topped with roasted red peppers . Vegetarian o Cucumber salad: cucumbers, artichoke hearts, celery, red onion, feta cheese, tossed in olive oil & lemon juice  o Hummus  and whole grain pita points with a greek salad (lettuce, tomato, feta, olives, cucumbers, red onion) o Lentil soup with celery, carrots made with vegetable broth, garlic, salt and pepper  o Tabouli salad: parsley, bulgur, mint, scallions, cucumbers, tomato, radishes, lemon juice, olive oil, salt and pepper.      

## 2020-11-19 ENCOUNTER — Encounter (INDEPENDENT_AMBULATORY_CARE_PROVIDER_SITE_OTHER): Payer: Self-pay | Admitting: Family Medicine

## 2020-11-19 ENCOUNTER — Ambulatory Visit (INDEPENDENT_AMBULATORY_CARE_PROVIDER_SITE_OTHER): Payer: 59 | Admitting: Family Medicine

## 2020-11-19 ENCOUNTER — Encounter: Payer: Self-pay | Admitting: Physician Assistant

## 2020-11-19 ENCOUNTER — Other Ambulatory Visit: Payer: Self-pay

## 2020-11-19 VITALS — BP 103/69 | HR 68 | Temp 97.6°F | Ht 76.0 in | Wt 335.0 lb

## 2020-11-19 DIAGNOSIS — I10 Essential (primary) hypertension: Secondary | ICD-10-CM

## 2020-11-19 DIAGNOSIS — Z1211 Encounter for screening for malignant neoplasm of colon: Secondary | ICD-10-CM | POA: Diagnosis not present

## 2020-11-19 DIAGNOSIS — R7303 Prediabetes: Secondary | ICD-10-CM | POA: Diagnosis not present

## 2020-11-19 DIAGNOSIS — Z6841 Body Mass Index (BMI) 40.0 and over, adult: Secondary | ICD-10-CM

## 2020-11-19 DIAGNOSIS — J302 Other seasonal allergic rhinitis: Secondary | ICD-10-CM

## 2020-11-19 DIAGNOSIS — Z9189 Other specified personal risk factors, not elsewhere classified: Secondary | ICD-10-CM | POA: Insufficient documentation

## 2020-11-19 DIAGNOSIS — R200 Anesthesia of skin: Secondary | ICD-10-CM

## 2020-11-19 MED ORDER — RYBELSUS 7 MG PO TABS: ORAL_TABLET | ORAL | 0 refills | Status: DC

## 2020-11-19 MED ORDER — LEVOCETIRIZINE DIHYDROCHLORIDE 5 MG PO TABS: 5.0000 mg | ORAL_TABLET | Freq: Every evening | ORAL | 0 refills | Status: DC

## 2020-11-19 MED FILL — RYBELSUS 7 MG TABS: 7 | 30 days supply | Qty: 30 | Fill #0

## 2020-11-19 MED FILL — MONTELUKAST SOD 10 MG TAB: 10 | 90 days supply | Qty: 90 | Fill #1

## 2020-11-19 MED FILL — LEVOCETIRIZINE 5 MG TABLET: 5 | 30 days supply | Qty: 30 | Fill #0

## 2020-11-20 LAB — COLOGUARD: Cologuard: NEGATIVE

## 2020-11-20 NOTE — Progress Notes (Signed)
Chief Complaint:   OBESITY Joseph Graves is here to discuss his progress with his obesity treatment plan along with follow-up of his obesity related diagnoses.   Today's visit was #: 21 Starting weight: 364 lbs Starting date: 10/19/2019 Today's weight: 335 lbs Today's date: 11/19/2020 Total lbs lost to date: 29 lbs Body mass index is 40.78 kg/m.  Total weight loss percentage to date: -7.97%  Interim History:  Joseph Graves is doing better with journaling.  Average protein intake is 75-100 grams per day, calories averaging 2000 calories per day.  Current Meal Plan: the Category 4 Plan and keeping a food journal and adhering to recommended goals of 2200 calories and 120 grams of protein for 80% of the time.  Current Exercise Plan: Walking for 20 minutes 7 times per week. Current Anti-Obesity Medications: Rybelsus 3 mg daily. Side effects: None.  Assessment/Plan:   No orders of the defined types were placed in this encounter.   Medications Discontinued During This Encounter  Medication Reason  . cromolyn (OPTICROM) 4 % ophthalmic solution   . erythromycin ophthalmic ointment   . Semaglutide (RYBELSUS) 3 MG TABS Dose change  . levocetirizine (XYZAL) 5 MG tablet Reorder     Meds ordered this encounter  Medications  . Semaglutide (RYBELSUS) 7 MG TABS    Sig: 1 po qd    Dispense:  30 tablet    Refill:  0  . levocetirizine (XYZAL) 5 MG tablet    Sig: Take 1 tablet (5 mg total) by mouth every evening.    Dispense:  30 tablet    Refill:  0     1. Prediabetes At goal. Goal is HgbA1c < 5.7.  Medication: Rybelsus 3 mg daily.  Rybelsus is working well (started at last office visit).  Increase in hunger in early evenings and especially first thing in the morning.  No side effects.  Overall though, it has helped with hunger.  Plan:  He will continue to focus on protein-rich, low simple carbohydrate foods. We reviewed the importance of hydration, regular exercise for stress reduction, and  restorative sleep.  Will increase Rybelsus from 3 mg daily to 7 mg daily.  Lab Results  Component Value Date   HGBA1C 5.6 08/01/2020   Lab Results  Component Value Date   INSULIN 31.0 (H) 08/01/2020   INSULIN 14.8 02/14/2020   INSULIN 41.0 (H) 10/19/2019   - Increase Semaglutide (RYBELSUS) 7 MG TABS; 1 po qd  Dispense: 30 tablet; Refill: 0  2. Essential hypertension At goal. Medications: HCTZ 12.5 mg daily, losartan 100 mg daily.  He is not checking his blood pressure at home.  No symptoms or concerns.  Plan: Avoid buying foods that are: processed, frozen, or prepackaged to avoid excess salt. We will continue to monitor closely alongside his PCP and/or Specialist.  Regular follow up with PCP and specialists was also encouraged.   BP Readings from Last 3 Encounters:  11/19/20 103/69  11/07/20 113/71  10/29/20 117/68   Lab Results  Component Value Date   CREATININE 0.96 08/01/2020   3. Seasonal allergies Joseph Graves is taking Xyzal 5 mg daily for seasonal allergies.  Plan:  Will refill Xyzal, as per below.  - Refill levocetirizine (XYZAL) 5 MG tablet; Take 1 tablet (5 mg total) by mouth every evening.  Dispense: 30 tablet; Refill: 0  4. At risk for malnutrition Joseph Graves was given extensive malnutrition prevention education and counseling today of more than 8 minutes.  Counseled him that malnutrition refers to  inappropriate nutrients or not the right balance of nutrients for optimal health.  Discussed with Joseph Graves that it is absolutely possible to be malnourished but yet obese.  Risk factors, including but not limited to, deficient protein intake, inappropriate dietary choices, difficulty with obtaining food due to physical or financial limitations, and various physical and mental health conditions were reviewed with Joseph Graves.   5. Class 3 severe obesity with serious comorbidity and body mass index (BMI) of 40.0 to 44.9 in adult, unspecified obesity type Joseph Graves)  Course:  Joseph Graves is currently in the action stage of change. As such, his goal is to continue with weight loss efforts.   Nutrition goals: He has agreed to the Category 4 Plan and keeping a food journal and adhering to recommended goals of 2200 calories and 120 grams of protein.   Exercise goals: As is.  Behavioral modification strategies: increasing lean protein intake, increasing water intake and better snacking choices.  Joseph Graves has agreed to follow-up with our clinic in 2-3 weeks. He was informed of the importance of frequent follow-up visits to maximize his success with intensive lifestyle modifications for his multiple health conditions.   Objective:   Blood pressure 103/69, pulse 68, temperature 97.6 F (36.4 C), height 6\' 4"  (1.93 m), weight (!) 335 lb (152 kg), SpO2 98 %. Body mass index is 40.78 kg/m.  General: Cooperative, alert, well developed, in no acute distress. HEENT: Conjunctivae and lids unremarkable. Cardiovascular: Regular rhythm.  Lungs: Normal work of breathing. Neurologic: No focal deficits.   Lab Results  Component Value Date   CREATININE 0.96 08/01/2020   BUN 16 08/01/2020   NA 141 08/01/2020   K 4.4 08/01/2020   CL 103 08/01/2020   CO2 25 08/01/2020   Lab Results  Component Value Date   ALT 22 08/01/2020   AST 17 08/01/2020   ALKPHOS 73 08/01/2020   BILITOT 0.6 08/01/2020   Lab Results  Component Value Date   HGBA1C 5.6 08/01/2020   HGBA1C 5.3 02/14/2020   HGBA1C 5.8 (H) 10/19/2019   HGBA1C 5.5 06/07/2018   HGBA1C 5.5 04/27/2017   Lab Results  Component Value Date   INSULIN 31.0 (H) 08/01/2020   INSULIN 14.8 02/14/2020   INSULIN 41.0 (H) 10/19/2019   Lab Results  Component Value Date   TSH 2.130 08/01/2020   Lab Results  Component Value Date   CHOL 131 08/01/2020   HDL 52 08/01/2020   LDLCALC 63 08/01/2020   TRIG 83 08/01/2020   CHOLHDL 3.6 06/07/2018   Lab Results  Component Value Date   WBC 6.1 06/07/2018   HGB 14.2 06/07/2018    HCT 42.4 06/07/2018   MCV 90 06/07/2018   PLT 311 06/07/2018   Attestation Statements:   Reviewed by clinician on day of visit: allergies, medications, problem list, medical history, surgical history, family history, social history, and previous encounter notes.  I, 06/09/2018, CMA, am acting as Insurance claims handler for Energy manager, DO.  I have reviewed the above documentation for accuracy and completeness, and I agree with the above. [employer], D.O.  The 21st Century Cures Act was signed into law in 2016 which includes the topic of electronic health records.  This provides immediate access to information in MyChart.  This includes consultation notes, operative notes, office notes, lab results and pathology reports.  If you have any questions about what you read please let 2017 know at your next visit so we can discuss your concerns  and take corrective action if need be.  We are right here with you.

## 2020-11-26 LAB — COLOGUARD: COLOGUARD: NEGATIVE

## 2020-11-29 MED FILL — HYDROCHLOROTHIAZIDE 12.5 MG: 12.5 | 90 days supply | Qty: 90 | Fill #1

## 2020-12-11 ENCOUNTER — Encounter: Payer: Self-pay | Admitting: Physician Assistant

## 2020-12-12 ENCOUNTER — Ambulatory Visit (INDEPENDENT_AMBULATORY_CARE_PROVIDER_SITE_OTHER): Payer: 59 | Admitting: Family Medicine

## 2020-12-12 ENCOUNTER — Other Ambulatory Visit: Payer: Self-pay

## 2020-12-12 ENCOUNTER — Other Ambulatory Visit (INDEPENDENT_AMBULATORY_CARE_PROVIDER_SITE_OTHER): Payer: Self-pay | Admitting: Family Medicine

## 2020-12-12 ENCOUNTER — Encounter (INDEPENDENT_AMBULATORY_CARE_PROVIDER_SITE_OTHER): Payer: Self-pay | Admitting: Family Medicine

## 2020-12-12 VITALS — BP 105/71 | HR 62 | Temp 97.4°F | Ht 76.0 in | Wt 330.0 lb

## 2020-12-12 DIAGNOSIS — Z6841 Body Mass Index (BMI) 40.0 and over, adult: Secondary | ICD-10-CM | POA: Diagnosis not present

## 2020-12-12 DIAGNOSIS — I10 Essential (primary) hypertension: Secondary | ICD-10-CM | POA: Diagnosis not present

## 2020-12-12 DIAGNOSIS — Z9189 Other specified personal risk factors, not elsewhere classified: Secondary | ICD-10-CM

## 2020-12-12 DIAGNOSIS — R7303 Prediabetes: Secondary | ICD-10-CM | POA: Diagnosis not present

## 2020-12-12 MED ORDER — RYBELSUS 7 MG PO TABS: ORAL_TABLET | ORAL | 0 refills | Status: DC

## 2020-12-13 MED FILL — RYBELSUS 7 MG TABS: 7 | 30 days supply | Qty: 30 | Fill #0

## 2020-12-18 NOTE — Progress Notes (Signed)
Chief Complaint:   OBESITY Joseph Graves is here to discuss his progress with his obesity treatment plan along with follow-up of his obesity related diagnoses.   Today's visit was #: 22 Starting weight: 364 lbs Starting date: 10/19/2019 Today's weight: 330 lbs Today's date: 12/12/2020 Total lbs lost to date: 34 lbs Body mass index is 40.17 kg/m.  Total weight loss percentage to date: -9.34%  Interim History:  Joseph Graves is using [employer] 2-3 times per day to augment his food intake.  Averaging 1800 calories per day plus 120 grams of protein daily.  No issues or concerns today.  Current Meal Plan: the Category 4 Plan and keeping a food journal and adhering to recommended goals of 2200 calories and 120 grams of protein for 100% of the time.  Current Exercise Plan: Walking for 60 minutes 6 times per week. Current Anti-Obesity Medications: Rybelsus 7 mg daily. Side effects: None.  Assessment/Plan:   No orders of the defined types were placed in this encounter.   Medications Discontinued During This Encounter  Medication Reason  . Semaglutide (RYBELSUS) 7 MG TABS Reorder     Meds ordered this encounter  Medications  . DISCONTD: Semaglutide (RYBELSUS) 7 MG TABS    Sig: 1 po qd    Dispense:  30 tablet    Refill:  0     1. Prediabetes At goal. Goal is HgbA1c < 5.7.  Medication: Rybelsus 7 mg daily.  Started Rybelsus last office visit.  Increased dose from 3 mg.  Tolerating well.  Working well with huger/cravings, per patient.  Plan:  He will continue to focus on protein-rich, low simple carbohydrate foods. We reviewed the importance of hydration, regular exercise for stress reduction, and restorative sleep.   Lab Results  Component Value Date   HGBA1C 5.6 08/01/2020   Lab Results  Component Value Date   INSULIN 31.0 (H) 08/01/2020   INSULIN 14.8 02/14/2020   INSULIN 41.0 (H) 10/19/2019   - Refill Semaglutide (RYBELSUS) 7 MG TABS; 1 po qd  Dispense: 30 tablet; Refill:  0  2. Essential hypertension At goal. Medications: HCTZ 12.5 mg daily, losartan 100 mg daily.  Denies lightheadedness/dizziness, etc.  Plan: Avoid buying foods that are: processed, frozen, or prepackaged to avoid excess salt. We will continue to monitor closely alongside his PCP and/or Specialist.  Regular follow up with PCP and specialists was also encouraged.   BP Readings from Last 3 Encounters:  12/12/20 105/71  11/19/20 103/69  11/07/20 113/71   Lab Results  Component Value Date   CREATININE 0.96 08/01/2020   3. At risk for deficient knowledge of diabetes mellitus Joseph Graves was given extensive education and counseling today of more than 9 minutes on risks associated with deficient food intake.  Counseled him on the importance of following our prescribed meal plan and eating adequate amounts of protein.  Discussed with Joseph Graves that inadequate food intake over longer periods of time can slow their metabolism down significantly.   4. Obesity, current BMI 40.2  Course: Joseph Graves is currently in the action stage of change. As such, his goal is to continue with weight loss efforts.   Nutrition goals: He has agreed to the Category 4 Plan and keeping a food journal and adhering to recommended goals of 2200 calories and 120 grams of protein.   Exercise goals: As is.  Behavioral modification strategies: increasing lean protein intake and increasing water intake.  Joseph Graves has agreed to follow-up with our clinic in 3-5  weeks. He was informed of the importance of frequent follow-up visits to maximize his success with intensive lifestyle modifications for his multiple health conditions.   Objective:   Blood pressure 105/71, pulse 62, temperature (!) 97.4 F (36.3 C), height 6\' 4"  (1.93 m), weight (!) 330 lb (149.7 kg), SpO2 100 %. Body mass index is 40.17 kg/m.  General: Cooperative, alert, well developed, in no acute distress. HEENT: Conjunctivae and lids unremarkable. Cardiovascular:  Regular rhythm.  Lungs: Normal work of breathing. Neurologic: No focal deficits.   Lab Results  Component Value Date   CREATININE 0.96 08/01/2020   BUN 16 08/01/2020   NA 141 08/01/2020   K 4.4 08/01/2020   CL 103 08/01/2020   CO2 25 08/01/2020   Lab Results  Component Value Date   ALT 22 08/01/2020   AST 17 08/01/2020   ALKPHOS 73 08/01/2020   BILITOT 0.6 08/01/2020   Lab Results  Component Value Date   HGBA1C 5.6 08/01/2020   HGBA1C 5.3 02/14/2020   HGBA1C 5.8 (H) 10/19/2019   HGBA1C 5.5 06/07/2018   HGBA1C 5.5 04/27/2017   Lab Results  Component Value Date   INSULIN 31.0 (H) 08/01/2020   INSULIN 14.8 02/14/2020   INSULIN 41.0 (H) 10/19/2019   Lab Results  Component Value Date   TSH 2.130 08/01/2020   Lab Results  Component Value Date   CHOL 131 08/01/2020   HDL 52 08/01/2020   LDLCALC 63 08/01/2020   TRIG 83 08/01/2020   CHOLHDL 3.6 06/07/2018   Lab Results  Component Value Date   WBC 6.1 06/07/2018   HGB 14.2 06/07/2018   HCT 42.4 06/07/2018   MCV 90 06/07/2018   PLT 311 06/07/2018   Attestation Statements:   Reviewed by clinician on day of visit: allergies, medications, problem list, medical history, surgical history, family history, social history, and previous encounter notes.  I, 06/09/2018, CMA, am acting as Insurance claims handler for Energy manager, DO.  I have reviewed the above documentation for accuracy and completeness, and I agree with the above. [employer], D.O.  The 21st Century Cures Act was signed into law in 2016 which includes the topic of electronic health records.  This provides immediate access to information in MyChart.  This includes consultation notes, operative notes, office notes, lab results and pathology reports.  If you have any questions about what you read please let 2017 know at your next visit so we can discuss your concerns and take corrective action if need be.  We are right here with you.

## 2020-12-21 ENCOUNTER — Encounter (INDEPENDENT_AMBULATORY_CARE_PROVIDER_SITE_OTHER): Payer: Self-pay | Admitting: Family Medicine

## 2020-12-24 ENCOUNTER — Other Ambulatory Visit (INDEPENDENT_AMBULATORY_CARE_PROVIDER_SITE_OTHER): Payer: Self-pay | Admitting: Adult Health

## 2020-12-24 ENCOUNTER — Other Ambulatory Visit (HOSPITAL_COMMUNITY): Payer: Self-pay

## 2020-12-24 MED ORDER — SEMAGLUTIDE 3 MG PO TABS
3.0000 mg | ORAL_TABLET | Freq: Every day | ORAL | 0 refills | Status: DC
  Filled 2020-12-24: qty 30, 30d supply, fill #0

## 2020-12-25 ENCOUNTER — Other Ambulatory Visit (HOSPITAL_COMMUNITY): Payer: Self-pay

## 2020-12-26 ENCOUNTER — Other Ambulatory Visit (HOSPITAL_COMMUNITY): Payer: Self-pay

## 2020-12-26 MED FILL — Tadalafil Tab 20 MG: ORAL | 30 days supply | Qty: 6 | Fill #0 | Status: AC

## 2020-12-28 ENCOUNTER — Other Ambulatory Visit (HOSPITAL_COMMUNITY): Payer: Self-pay

## 2021-01-02 ENCOUNTER — Encounter (INDEPENDENT_AMBULATORY_CARE_PROVIDER_SITE_OTHER): Payer: Self-pay | Admitting: Family Medicine

## 2021-01-02 ENCOUNTER — Other Ambulatory Visit (HOSPITAL_BASED_OUTPATIENT_CLINIC_OR_DEPARTMENT_OTHER): Payer: Self-pay

## 2021-01-02 ENCOUNTER — Other Ambulatory Visit (HOSPITAL_COMMUNITY): Payer: Self-pay

## 2021-01-02 MED FILL — Bupropion HCl Tab ER 24HR 150 MG: ORAL | 90 days supply | Qty: 180 | Fill #0 | Status: AC

## 2021-01-04 ENCOUNTER — Encounter: Payer: Self-pay | Admitting: Physician Assistant

## 2021-01-04 DIAGNOSIS — R200 Anesthesia of skin: Secondary | ICD-10-CM

## 2021-01-13 ENCOUNTER — Encounter: Payer: Self-pay | Admitting: Emergency Medicine

## 2021-01-13 ENCOUNTER — Ambulatory Visit
Admission: EM | Admit: 2021-01-13 | Discharge: 2021-01-13 | Disposition: A | Discharge status: Home / Self Care | Payer: 59

## 2021-01-13 ENCOUNTER — Other Ambulatory Visit: Payer: Self-pay

## 2021-01-13 DIAGNOSIS — J0110 Acute frontal sinusitis, unspecified: Secondary | ICD-10-CM | POA: Diagnosis not present

## 2021-01-13 DIAGNOSIS — Z7689 Persons encountering health services in other specified circumstances: Secondary | ICD-10-CM | POA: Diagnosis not present

## 2021-01-13 MED ORDER — DOXYCYCLINE HYCLATE 100 MG PO CAPS: 100.0000 mg | ORAL_CAPSULE | Freq: Two times a day (BID) | ORAL | 0 refills | Status: DC

## 2021-01-13 MED ORDER — ONDANSETRON HCL 4 MG PO TABS: 4.0000 mg | ORAL_TABLET | Freq: Three times a day (TID) | ORAL | 0 refills | Status: DC | PRN

## 2021-01-13 MED ORDER — IBUPROFEN 800 MG PO TABS
800.0000 mg | ORAL_TABLET | Freq: Once | ORAL | Status: AC
  Administered 2021-01-13: 800 mg via ORAL

## 2021-01-13 NOTE — ED Provider Notes (Signed)
[facility]    CSN: [id number] Arrival date & time: 01/13/21  0908      History   Chief Complaint Chief Complaint  Patient presents with  . Generalized Body Aches    HPI EMANUAL LAMOUNTAIN is a 50 y.o. male.   Abdirahman Duaine Dredge presents with complaints of congestion, cough, sinus pressure, body aches and fevers. States he has congestion for the past month which significantly worsened yesterday while at the beach. Woke with aches and fever today. No shortness of breath. Yesterday felt a lot of left facial pressure behind left eye. History of sinus infections. Took tylenol which hasn't helped. Uses regular nasal spray and allergy medications. No known ill contacts.     ROS per HPI, negative if not otherwise mentioned.      Past Medical History:  Diagnosis Date  . Allergy   . Anxiety   . Asthma    outgrew at 37-12 years of age.   . Depression   . Edema of both feet   . GERD (gastroesophageal reflux disease)   . Hypertension   . Lactose intolerance   . Numbness in feet   . Sleep apnea     Patient Active Problem List   Diagnosis Date Noted  . At risk for malnutrition 11/19/2020  . At risk for activity intolerance 10/29/2020  . Seasonal allergies 09/11/2020  . At risk for diabetes mellitus 08/22/2020  . Essential hypertension 08/01/2020  . Low testosterone 08/01/2020  . Adjustment disorder with mixed anxiety and depressed mood 08/01/2020  . Nonalcoholic hepatosteatosis 08/01/2020  . Low testosterone in male 08/01/2020  . At risk for impaired metabolic function 08/01/2020  . Insulin resistance 03/06/2020  . B12 deficiency 02/14/2020  . Prediabetes 10/20/2019  . Low HDL (under 40) 10/20/2019  . Elevated ALT measurement 10/20/2019  . Erectile dysfunction due to diseases classified elsewhere 09/26/2019  . Testosterone insufficiency 09/26/2019  . History of non anemic vitamin B12 deficiency 06/07/2018  . Depression 03/02/2018  . Hypertension 04/27/2017  .  GERD (gastroesophageal reflux disease) 04/27/2017  . Environmental and seasonal allergies 04/27/2017  . Vitamin D deficiency 04/27/2017  . Adjustment disorder with depressed mood in remission 04/27/2017  . Class 2 severe obesity with serious comorbidity and body mass index (BMI) of 37.0 to 37.9 in adult (HCC) 04/27/2017  . Numbness and tingling of both feet 04/27/2017  . OSA on CPAP 04/27/2017  . Family history of suicide- father age 60 04/27/2017  . Family history of alcoholism in grandfathers 04/27/2017  . Family history of drug addiction 04/27/2017  . Family history of depression 04/27/2017  . Family history of diet-controlled diabetes- father in 46s 04/27/2017  . Family history of non-Hodgkin's lymphoma- father 04/27/2017  . Erectile dysfunction 04/19/2015    Past Surgical History:  Procedure Laterality Date  . BICEPS TENDON REPAIR  11/2013  . NASAL SINUS SURGERY    . VASECTOMY         Home Medications    Prior to Admission medications   Medication Sig Start Date End Date Taking? Authorizing Provider  acetaminophen (TYLENOL) 325 MG tablet Take 650 mg by mouth every 6 (six) hours as needed.   Yes [provider]  azelastine (ASTELIN) 0.1 % nasal spray Place 1 spray into both nostrils 2 (two) times daily. 1 spray each nostril twice daily after sinus rinses 11/07/20  Yes Abonza, Maritza, PA-C  buPROPion (WELLBUTRIN XL) 150 MG 24 hr tablet TAKE 2 TABLETS BY MOUTH DAILY 11/07/20 11/07/21  Yes Mayer Masker, PA-C  Cholecalciferol (VITAMIN D3) 125 MCG (5000 UT) CAPS 5000 IU Monday-Friday and 10,000 IU on Saturday and Sunday. 03/06/20  Yes Whitmire, Thermon Leyland, FNP  doxycycline (VIBRAMYCIN) 100 MG capsule Take 1 capsule (100 mg total) by mouth 2 (two) times daily. 01/13/21  Yes Treylin Burtch, Dorene Grebe B, NP  fluticasone (FLONASE) 50 MCG/ACT nasal spray PLACE 2 SPRAYS INTO BOTH NOSTRILS DAILY. 11/07/20 11/07/21 Yes Abonza, Maritza, PA-C  hydrochlorothiazide (MICROZIDE) 12.5 MG capsule TAKE 1  CAPSULE BY MOUTH ONCE A DAY 11/07/20 11/07/21 Yes Abonza, Maritza, PA-C  levocetirizine (XYZAL) 5 MG tablet TAKE 1 TABLET BY MOUTH EVERY EVENING 11/19/20 11/19/21 Yes Opalski, Deborah, DO  losartan (COZAAR) 100 MG tablet TAKE 1 TABLET BY MOUTH ONCE A DAY 11/07/20 11/07/21 Yes Abonza, Maritza, PA-C  montelukast (SINGULAIR) 10 MG tablet TAKE 1 TABLET BY MOUTH AT BEDTIME 11/07/20 11/07/21 Yes Abonza, Maritza, PA-C  Omega-3 Fatty Acids (FISH OIL) 1200 MG CAPS Take 1,200 mg by mouth daily.    Yes [provider]  ondansetron (ZOFRAN) 4 MG tablet Take 1 tablet (4 mg total) by mouth every 8 (eight) hours as needed for nausea or vomiting. 01/13/21  Yes Abir Eroh B, NP  Semaglutide 3 MG TABS Take 3 mg by mouth daily. 12/24/20  Yes Danford, Orpha Bur D, NP  Azelastine HCl 137 MCG/SPRAY SOLN PLACE 1 SPRAY INTO EACH NOSTRILS 2 TIMES DAILY AFTER SINUS RINSES 11/07/20 11/07/21  Abonza, Maritza, PA-C  Azelastine HCl 137 MCG/SPRAY SOLN PLACE 1 SPRAY INTO BOTH NOSTRILS 2 TIMES DAILY AFTER SINUS RINSES 08/22/20 08/22/21  Opalski, Deborah, DO  ibuprofen (ADVIL) 200 MG tablet Take 200 mg by mouth every 6 (six) hours as needed.    [provider]  OVER THE COUNTER MEDICATION Take 1 tablet by mouth daily. [employer] Mega Men Prostate and Virility Supplement    [provider]  tadalafil (CIALIS) 20 MG tablet TAKE 1/2 TO 1 TABLET BY MOUTH DAILY AS NEEDED FOR ERECTILE DYSFUNCTION 1 HOUR PRIOR 11/07/20 11/07/21  Mayer Masker, PA-C  cetirizine (ZYRTEC) 10 MG tablet Take 1 tablet (10 mg total) by mouth daily. 08/06/20 08/22/20  Mayer Masker, PA-C  metFORMIN (GLUCOPHAGE) 500 MG tablet Take 1 tablet (500 mg total) by mouth 2 (two) times daily with a meal. 10/04/20 10/29/20  Thomasene Lot, DO    Family History Family History  Problem Relation Age of Onset  . Stroke Mother   . Allergic rhinitis Mother   . Sleep apnea Mother   . Obesity Mother   . Bipolar disorder Mother   . Depression Father        suicide  .  Cancer Father   . Diabetes Father   . Allergic rhinitis Father   . Anxiety disorder Father   . Diabetes Paternal Uncle   . Alcohol abuse Maternal Grandfather   . Hypertension Maternal Grandfather   . Alcohol abuse Paternal Grandfather     Social History Social History   Tobacco Use  . Smoking status: Former Smoker    Packs/day: 0.25    Years: 4.50    Pack years: 1.12    Quit date: 1992    Years since quitting: 30.3  . Smokeless tobacco: Never Used  Vaping Use  . Vaping Use: Never used  Substance Use Topics  . Alcohol use: Yes    Alcohol/week: 1.0 standard drink    Types: 1 Cans of beer per week  . Drug use: No     Allergies   Amoxicillin-pot clavulanate and  Penicillins   Review of Systems Review of Systems   Physical Exam Triage Vital Signs ED Triage Vitals  Enc Vitals Group     BP 01/13/21 1016 126/78     Pulse Rate 01/13/21 1016 (!) 126     Resp 01/13/21 1016 (!) 24     Temp 01/13/21 1016 (!) 102.2 F (39 C)     Temp Source 01/13/21 1016 Oral     SpO2 01/13/21 1016 95 %     Weight --      Height --      Head Circumference --      Peak Flow --      Pain Score 01/13/21 1013 4     Pain Loc --      Pain Edu? --      Excl. in GC? --    No data found.  Updated Vital Signs BP 126/78 (BP Location: Right Arm) Comment (BP Location): regular/forearm  Pulse (!) 126   Temp (!) 102.2 F (39 C) (Oral)   Resp (!) 24   SpO2 95%   Visual Acuity Right Eye Distance:   Left Eye Distance:   Bilateral Distance:    Right Eye Near:   Left Eye Near:    Bilateral Near:     Physical Exam Constitutional:      Appearance: He is well-developed. He is ill-appearing.  HENT:     Right Ear: Tympanic membrane is injected and bulging.     Left Ear: Tympanic membrane is injected and bulging.     Nose: Mucosal edema, congestion and rhinorrhea present.     Right Sinus: Frontal sinus tenderness present.     Left Sinus: Frontal sinus tenderness present.  Cardiovascular:      Rate and Rhythm: Tachycardia present.  Pulmonary:     Effort: Pulmonary effort is normal.  Skin:    General: Skin is warm and dry.  Neurological:     Mental Status: He is alert and oriented to person, place, and time.      UC Treatments / Results  Labs (all labs ordered are listed, but only abnormal results are displayed) Labs Reviewed  COVID-19, FLU A+B NAA    EKG   Radiology No results found.  Procedures Procedures (including critical care time)  Medications Ordered in UC Medications  ibuprofen (ADVIL) tablet 800 mg (800 mg Oral Given 01/13/21 1045)    Initial Impression / Assessment and Plan / UC Course  I have reviewed the triage vital signs and the nursing notes.  Pertinent labs & imaging results that were available during my care of the patient were reviewed by me and considered in my medical decision making (see chart for details).     Febrile and tachycardia with exam consistent with sinusitis. Influenza and covid testing also collected with antibiotics started empirically in the mean time. Return precautions provided. Patient verbalized understanding and agreeable to plan.   Final Clinical Impressions(s) / UC Diagnoses   Final diagnoses:  Acute frontal sinusitis, recurrence not specified     Discharge Instructions     Push fluids to ensure adequate hydration and keep secretions thin.  Complete course of antibiotics.  Tylenol and ibuprofen as needed for symptoms.  Continue with your nasal sprays.  Self isolate until covid results are back.  We will notify you by phone if it is positive. Your negative results will be sent through your MyChart.    If it is positive you need to isolate from others for a  total of 5 days. If no fever for 24 hours without medications, and symptoms improving you may end isolation on day 6, but wear a mask if around any others for an additional 5 days.  If symptoms worsen or do not improve in the next week to return to be  seen or to follow up with your PCP.     ED Prescriptions    Medication Sig Dispense Auth. Provider   doxycycline (VIBRAMYCIN) 100 MG capsule Take 1 capsule (100 mg total) by mouth 2 (two) times daily. 20 capsule Muntaha Vermette B, NP   ondansetron (ZOFRAN) 4 MG tablet Take 1 tablet (4 mg total) by mouth every 8 (eight) hours as needed for nausea or vomiting. 10 tablet Georgetta HaberBurky, Ravindra Baranek B, NP     PDMP not reviewed this encounter.   Georgetta HaberBurky, Maridel Pixler B, NP 01/13/21 1133

## 2021-01-13 NOTE — Discharge Instructions (Addendum)
Push fluids to ensure adequate hydration and keep secretions thin.  Complete course of antibiotics.  Tylenol and ibuprofen as needed for symptoms.  Continue with your nasal sprays.  Self isolate until covid results are back.  We will notify you by phone if it is positive. Your negative results will be sent through your MyChart.    If it is positive you need to isolate from others for a total of 5 days. If no fever for 24 hours without medications, and symptoms improving you may end isolation on day 6, but wear a mask if around any others for an additional 5 days.  If symptoms worsen or do not improve in the next week to return to be seen or to follow up with your PCP.

## 2021-01-13 NOTE — ED Triage Notes (Signed)
Yesterday had a runny nose, blowing nose.  Patient was at the beach yesterday.  Today fever, aches and nausea, no vomiting yet

## 2021-01-15 LAB — COVID-19, FLU A+B NAA
Influenza A, NAA: NOT DETECTED
Influenza B, NAA: NOT DETECTED
SARS-CoV-2, NAA: NOT DETECTED

## 2021-01-16 ENCOUNTER — Other Ambulatory Visit (HOSPITAL_COMMUNITY): Payer: Self-pay

## 2021-01-16 ENCOUNTER — Telehealth (INDEPENDENT_AMBULATORY_CARE_PROVIDER_SITE_OTHER): Payer: 59 | Admitting: Family Medicine

## 2021-01-16 ENCOUNTER — Encounter (INDEPENDENT_AMBULATORY_CARE_PROVIDER_SITE_OTHER): Payer: Self-pay | Admitting: Family Medicine

## 2021-01-16 VITALS — BP 117/77 | HR 70 | Temp 98.5°F | Ht 76.0 in | Wt 324.0 lb

## 2021-01-16 DIAGNOSIS — Z6841 Body Mass Index (BMI) 40.0 and over, adult: Secondary | ICD-10-CM | POA: Diagnosis not present

## 2021-01-16 DIAGNOSIS — R7303 Prediabetes: Secondary | ICD-10-CM

## 2021-01-16 DIAGNOSIS — E559 Vitamin D deficiency, unspecified: Secondary | ICD-10-CM | POA: Diagnosis not present

## 2021-01-16 DIAGNOSIS — Z9189 Other specified personal risk factors, not elsewhere classified: Secondary | ICD-10-CM

## 2021-01-16 MED ORDER — VITAMIN D3 125 MCG (5000 UT) PO CAPS: ORAL_CAPSULE | ORAL | 0 refills | Status: DC

## 2021-01-16 MED ORDER — SEMAGLUTIDE 3 MG PO TABS
3.0000 mg | ORAL_TABLET | Freq: Every day | ORAL | 0 refills | Status: DC
  Filled 2021-01-16: qty 30, 30d supply, fill #0

## 2021-01-17 ENCOUNTER — Other Ambulatory Visit (HOSPITAL_COMMUNITY): Payer: Self-pay

## 2021-01-23 NOTE — Progress Notes (Signed)
TeleHealth Visit:  Due to the COVID-19 pandemic, this visit was completed with telemedicine (audio/video) technology to reduce patient and provider exposure as well as to preserve personal protective equipment.   Joseph Graves has verbally consented to this TeleHealth visit. The patient is located at home, the provider is located at the [facility] office.  Time spent on this OV was approx 25 min. The participants in this visit include the listed provider and patient and. The visit was conducted today via MyChart.  OBESITY Joseph Graves is here to discuss his progress with his obesity treatment plan along with follow-up of his obesity related diagnoses.   Today's visit was #: 23 Starting weight: 364 lbs Starting date: 10/19/2019 Today's date: 01/16/2021  Interim History:  Joseph Graves is here for a follow up office visit and he is following the meal plan without concerns or issues.  Patient's meal and food recall appears to be accurate and consistent with what is on the plan.  When on plan, his hunger and cravings are well controlled.    Live video visit for 20+ minutes face to face with additional time spent pre and post visit.  Current Meal Plan: the Category 4 Plan and keeping a food journal and adhering to recommended goals of 2200 calories and 120 grams of protein for 80% of the time.  Current Exercise Plan: Walking for 30 minutes 3 times per week. Current Anti-Obesity Medications: Semaglutide 3 mg daily. Side effects: None.  Assessment/Plan:   1. Prediabetes At goal. Goal is HgbA1c < 5.7.  Medication: Rybelsus 3 mg daily. He was unable to tolerate Rybelsus 7 mg due to GI upset.  The 3 mg dose works well. He has decreased appetite and less desire for "junk food" but is able to get in all foods on the plan.   Plan:  He will continue to focus on protein-rich, low simple carbohydrate foods. We reviewed the importance of hydration, regular exercise for stress reduction, and  restorative sleep.  Disease counseling done.  Medication counseling done.  Recheck A1c and fasting insulin at next office visit.  Lab Results  Component Value Date   HGBA1C 5.6 08/01/2020   Lab Results  Component Value Date   INSULIN 31.0 (H) 08/01/2020   INSULIN 14.8 02/14/2020   INSULIN 41.0 (H) 10/19/2019   - Refill Semaglutide 3 MG TABS; Take 1 tablet (3 mg) by mouth daily.  Dispense: 30 tablet; Refill: 0  2. Vitamin D deficiency At goal. Current vitamin D is 56.1, tested on 08/01/2020. Optimal goal > 50 ng/dL. Denies issues or concerns.  Taking OTC vitamin D 5,000 IU daily.   Plan: Continue current OTC vitamin D supplementation.  Vitamin D was last checked 6 months ago.  Recheck vitamin D level at next office visit.  - Refill Cholecalciferol (VITAMIN D3) 125 MCG (5000 UT) CAPS; 5000 IU Monday-Friday and 10,000 IU on Saturday and Sunday.  Dispense: 30 capsule; Refill: 0  3. Obesity, current BMI 40.17  Course: Joseph Graves is currently in the action stage of change. As such, his goal is to continue with weight loss efforts.   Nutrition goals: He has agreed to the Category 4 Plan and keeping a food journal and adhering to recommended goals of 2200 calories and 120 grams of protein.   Exercise goals: As is.  Behavioral modification strategies: meal planning and cooking strategies, avoiding temptations, planning for success and keeping a strict food journal.  Joseph Graves has agreed to follow-up with our  clinic on 02/06/2021 weeks. He was informed of the importance of frequent follow-up visits to maximize his success with intensive lifestyle modifications for his multiple health conditions.   Objective:   Blood pressure 117/77, pulse 70, temperature 98.5 F (36.9 C), height 6\' 4"  (1.93 m), weight (!) 324 lb (147 kg). Body mass index is 39.44 kg/m.  General: Cooperative, alert, well developed, in no acute distress. HEENT: Conjunctivae and lids unremarkable. Cardiovascular: Regular rhythm.   Lungs: Normal work of breathing. Neurologic: No focal deficits.   Lab Results  Component Value Date   CREATININE 0.96 08/01/2020   BUN 16 08/01/2020   NA 141 08/01/2020   K 4.4 08/01/2020   CL 103 08/01/2020   CO2 25 08/01/2020   Lab Results  Component Value Date   ALT 22 08/01/2020   AST 17 08/01/2020   ALKPHOS 73 08/01/2020   BILITOT 0.6 08/01/2020   Lab Results  Component Value Date   HGBA1C 5.6 08/01/2020   HGBA1C 5.3 02/14/2020   HGBA1C 5.8 (H) 10/19/2019   HGBA1C 5.5 06/07/2018   HGBA1C 5.5 04/27/2017   Lab Results  Component Value Date   INSULIN 31.0 (H) 08/01/2020   INSULIN 14.8 02/14/2020   INSULIN 41.0 (H) 10/19/2019   Lab Results  Component Value Date   TSH 2.130 08/01/2020   Lab Results  Component Value Date   CHOL 131 08/01/2020   HDL 52 08/01/2020   LDLCALC 63 08/01/2020   TRIG 83 08/01/2020   CHOLHDL 3.6 06/07/2018   Lab Results  Component Value Date   WBC 6.1 06/07/2018   HGB 14.2 06/07/2018   HCT 42.4 06/07/2018   MCV 90 06/07/2018   PLT 311 06/07/2018   Attestation Statements:   Reviewed by clinician on day of visit: allergies, medications, problem list, medical history, surgical history, family history, social history, and previous encounter notes.  I, 06/09/2018, CMA, am acting as Insurance claims handler for Energy manager, DO.  I have reviewed the above documentation for accuracy and completeness, and I agree with the above. [employer], D.O.  The 21st Century Cures Act was signed into law in 2016 which includes the topic of electronic health records.  This provides immediate access to information in MyChart.  This includes consultation notes, operative notes, office notes, lab results and pathology reports.  If you have any questions about what you read please let 2017 know at your next visit so we can discuss your concerns and take corrective action if need be.  We are right here with you.

## 2021-02-06 ENCOUNTER — Other Ambulatory Visit (HOSPITAL_COMMUNITY): Payer: Self-pay

## 2021-02-06 ENCOUNTER — Ambulatory Visit (INDEPENDENT_AMBULATORY_CARE_PROVIDER_SITE_OTHER): Payer: 59 | Admitting: Family Medicine

## 2021-02-06 ENCOUNTER — Encounter (INDEPENDENT_AMBULATORY_CARE_PROVIDER_SITE_OTHER): Payer: Self-pay | Admitting: Family Medicine

## 2021-02-06 ENCOUNTER — Other Ambulatory Visit: Payer: Self-pay

## 2021-02-06 VITALS — BP 102/66 | HR 66 | Temp 97.5°F | Ht 76.0 in | Wt 326.0 lb

## 2021-02-06 DIAGNOSIS — Z9189 Other specified personal risk factors, not elsewhere classified: Secondary | ICD-10-CM

## 2021-02-06 DIAGNOSIS — E559 Vitamin D deficiency, unspecified: Secondary | ICD-10-CM | POA: Diagnosis not present

## 2021-02-06 DIAGNOSIS — R7303 Prediabetes: Secondary | ICD-10-CM

## 2021-02-06 DIAGNOSIS — Z6841 Body Mass Index (BMI) 40.0 and over, adult: Secondary | ICD-10-CM | POA: Diagnosis not present

## 2021-02-06 MED ORDER — SEMAGLUTIDE 3 MG PO TABS
3.0000 mg | ORAL_TABLET | Freq: Every day | ORAL | 0 refills | Status: DC
  Filled 2021-02-06 – 2021-02-14 (×3): qty 30, 30d supply, fill #0

## 2021-02-07 LAB — INSULIN, RANDOM: INSULIN: 15.5 u[IU]/mL (ref 2.6–24.9)

## 2021-02-07 LAB — HEMOGLOBIN A1C
Est. average glucose Bld gHb Est-mCnc: 108 mg/dL
Hgb A1c MFr Bld: 5.4 % (ref 4.8–5.6)

## 2021-02-07 LAB — VITAMIN D 25 HYDROXY (VIT D DEFICIENCY, FRACTURES): Vit D, 25-Hydroxy: 56 ng/mL (ref 30.0–100.0)

## 2021-02-09 ENCOUNTER — Other Ambulatory Visit (HOSPITAL_COMMUNITY): Payer: Self-pay

## 2021-02-12 ENCOUNTER — Other Ambulatory Visit (HOSPITAL_COMMUNITY): Payer: Self-pay

## 2021-02-12 NOTE — Progress Notes (Signed)
Chief Complaint:   OBESITY Joseph Graves is here to discuss his progress with his obesity treatment plan along with follow-up of his obesity related diagnoses.   Today's visit was #: 24 Starting weight: 364 lbs Starting date: 10/19/2019 Today's weight: 326 lbs Today's date: 02/06/2021 Weight change since last visit: 4 lbs Total lbs lost to date: 38 lbs Body mass index is 39.68 kg/m.  Total weight loss percentage to date: -10.44%  Interim History:  Joseph Graves says he logged his food 100% of the time but hit his calorie and protein goals 75-80% of the time.  No issues or concerns with the plan.  He is snacking less on candy/junk food.  Current Meal Plan: keeping a food journal and adhering to recommended goals of 2200 calories and 120 grams of protein for 80% of the time.  Current Exercise Plan: Walking for 30 minutes 4 times per week. Current Anti-Obesity Medications: Rybelsus 3 mg daily. Side effects: None.  Assessment/Plan:   Medications Discontinued During This Encounter  Medication Reason  . Semaglutide 3 MG TABS Reorder   Meds ordered this encounter  Medications  . Semaglutide 3 MG TABS    Sig: Take 1 tablet (3 mg) by mouth daily.    Dispense:  30 tablet    Refill:  0   1. Prediabetes At goal. Goal is HgbA1c < 5.7.  Medication: Rybelsus 3 mg daily.    Plan:  Continue Rybelsus.  Will refill today.  He will continue to focus on protein-rich, low simple carbohydrate foods. We reviewed the importance of hydration, regular exercise for stress reduction, and restorative sleep.  Will check A1c and insulin level today.  Lab Results  Component Value Date   HGBA1C 5.4 02/06/2021   Lab Results  Component Value Date   INSULIN 15.5 02/06/2021   INSULIN 31.0 (H) 08/01/2020   INSULIN 14.8 02/14/2020   INSULIN 41.0 (H) 10/19/2019   - Refill Semaglutide 3 MG TABS; Take 1 tablet (3 mg) by mouth daily.  Dispense: 30 tablet; Refill: 0  2. Vitamin D deficiency At goal. Current vitamin  D is 56.1, tested on 08/01/2020. Optimal goal > 50 ng/dL.  He is taking OTC vitamin D 5,000 IU daily M-F and 10,000 IU on Saturday and Sunday.  Plan: Continue current OTC vitamin D supplementation.  Will check vitamin D level today.  3. At risk for malnutrition Joseph Graves was given extensive malnutrition prevention education and counseling today of more than 9 minutes.  Counseled him that malnutrition refers to inappropriate nutrients or not the right balance of nutrients for optimal health.  Discussed with Joseph Graves that it is absolutely possible to be malnourished but yet obese.  Risk factors, including but not limited to, inappropriate dietary choices, difficulty with obtaining food due to physical or financial limitations, and various physical and mental health conditions were reviewed with Joseph Graves.   4. Obesity, current BMI 39.7  Course: Joseph Graves is currently in the action stage of change. As such, his goal is to continue with weight loss efforts.   Nutrition goals: He has agreed to keeping a food journal and adhering to recommended goals of 2200 calories and 120 grams of protein.   Exercise goals: For substantial health benefits, adults should do at least 150 minutes (2 hours and 30 minutes) a week of moderate-intensity, or 75 minutes (1 hour and 15 minutes) a week of vigorous-intensity aerobic physical activity, or an equivalent combination of moderate- and vigorous-intensity aerobic activity. Aerobic activity  should be performed in episodes of at least 10 minutes, and preferably, it should be spread throughout the week.  Behavioral modification strategies: increasing lean protein intake, better snacking choices and planning for success.  Joseph Graves has agreed to follow-up with our clinic in 3 weeks. He was informed of the importance of frequent follow-up visits to maximize his success with intensive lifestyle modifications for his multiple health conditions.   Joseph Graves was informed we  would discuss his lab results at his next visit unless there is a critical issue that needs to be addressed sooner. Joseph Graves agreed to keep his next visit at the agreed upon time to discuss these results.  Objective:   Blood pressure 102/66, pulse 66, temperature (!) 97.5 F (36.4 C), height 6\' 4"  (1.93 m), weight (!) 326 lb (147.9 kg), SpO2 99 %. Body mass index is 39.68 kg/m.  General: Cooperative, alert, well developed, in no acute distress. HEENT: Conjunctivae and lids unremarkable. Cardiovascular: Regular rhythm.  Lungs: Normal work of breathing. Neurologic: No focal deficits.   Lab Results  Component Value Date   CREATININE 0.96 08/01/2020   BUN 16 08/01/2020   NA 141 08/01/2020   K 4.4 08/01/2020   CL 103 08/01/2020   CO2 25 08/01/2020   Lab Results  Component Value Date   ALT 22 08/01/2020   AST 17 08/01/2020   ALKPHOS 73 08/01/2020   BILITOT 0.6 08/01/2020   Lab Results  Component Value Date   HGBA1C 5.4 02/06/2021   HGBA1C 5.6 08/01/2020   HGBA1C 5.3 02/14/2020   HGBA1C 5.8 (H) 10/19/2019   HGBA1C 5.5 06/07/2018   Lab Results  Component Value Date   INSULIN 15.5 02/06/2021   INSULIN 31.0 (H) 08/01/2020   INSULIN 14.8 02/14/2020   INSULIN 41.0 (H) 10/19/2019   Lab Results  Component Value Date   TSH 2.130 08/01/2020   Lab Results  Component Value Date   CHOL 131 08/01/2020   HDL 52 08/01/2020   LDLCALC 63 08/01/2020   TRIG 83 08/01/2020   CHOLHDL 3.6 06/07/2018   Lab Results  Component Value Date   WBC 6.1 06/07/2018   HGB 14.2 06/07/2018   HCT 42.4 06/07/2018   MCV 90 06/07/2018   PLT 311 06/07/2018   Attestation Statements:   Reviewed by clinician on day of visit: allergies, medications, problem list, medical history, surgical history, family history, social history, and previous encounter notes.  I, 06/09/2018, CMA, am acting as Insurance claims handler for Energy manager, DO.  I have reviewed the above documentation for accuracy and  completeness, and I agree with the above. [employer], D.O.  The 21st Century Cures Act was signed into law in 2016 which includes the topic of electronic health records.  This provides immediate access to information in MyChart.  This includes consultation notes, operative notes, office notes, lab results and pathology reports.  If you have any questions about what you read please let 2017 know at your next visit so we can discuss your concerns and take corrective action if need be.  We are right here with you.

## 2021-02-14 ENCOUNTER — Other Ambulatory Visit (HOSPITAL_COMMUNITY): Payer: Self-pay

## 2021-02-21 ENCOUNTER — Other Ambulatory Visit (HOSPITAL_COMMUNITY): Payer: Self-pay

## 2021-02-21 MED FILL — Montelukast Sodium Tab 10 MG (Base Equiv): ORAL | 90 days supply | Qty: 90 | Fill #0 | Status: AC

## 2021-02-21 MED FILL — Losartan Potassium Tab 100 MG: ORAL | 90 days supply | Qty: 90 | Fill #0 | Status: AC

## 2021-03-04 ENCOUNTER — Encounter (INDEPENDENT_AMBULATORY_CARE_PROVIDER_SITE_OTHER): Payer: Self-pay | Admitting: Family Medicine

## 2021-03-04 ENCOUNTER — Other Ambulatory Visit: Payer: Self-pay

## 2021-03-04 ENCOUNTER — Ambulatory Visit (INDEPENDENT_AMBULATORY_CARE_PROVIDER_SITE_OTHER): Payer: 59 | Admitting: Family Medicine

## 2021-03-04 ENCOUNTER — Other Ambulatory Visit (HOSPITAL_COMMUNITY): Payer: Self-pay

## 2021-03-04 VITALS — BP 125/68 | HR 64 | Temp 97.7°F | Ht 76.0 in | Wt 328.0 lb

## 2021-03-04 DIAGNOSIS — Z6841 Body Mass Index (BMI) 40.0 and over, adult: Secondary | ICD-10-CM

## 2021-03-04 DIAGNOSIS — R7303 Prediabetes: Secondary | ICD-10-CM | POA: Diagnosis not present

## 2021-03-04 DIAGNOSIS — E559 Vitamin D deficiency, unspecified: Secondary | ICD-10-CM | POA: Diagnosis not present

## 2021-03-04 DIAGNOSIS — Z9189 Other specified personal risk factors, not elsewhere classified: Secondary | ICD-10-CM

## 2021-03-04 DIAGNOSIS — I10 Essential (primary) hypertension: Secondary | ICD-10-CM | POA: Diagnosis not present

## 2021-03-04 MED ORDER — SEMAGLUTIDE 7 MG PO TABS
ORAL_TABLET | ORAL | 0 refills | Status: DC
  Filled 2021-03-04: qty 30, 30d supply, fill #0

## 2021-03-08 ENCOUNTER — Other Ambulatory Visit (HOSPITAL_COMMUNITY): Payer: Self-pay

## 2021-03-08 ENCOUNTER — Other Ambulatory Visit (HOSPITAL_BASED_OUTPATIENT_CLINIC_OR_DEPARTMENT_OTHER): Payer: Self-pay

## 2021-03-11 NOTE — Progress Notes (Signed)
Chief Complaint:   OBESITY Joseph Graves is here to discuss his progress with his obesity treatment plan along with follow-up of his obesity related diagnoses.   Today's visit was #: 25 Starting weight: 364 lbs Starting date: 10/19/2019 Today's weight: 328 lbs Today's date: 03/04/2021 Weight change since last visit: +2 lbs Total lbs lost to date: -36 lbs Body mass index is 39.93 kg/m.  Total weight loss percentage to date: 9.89%  Interim History: Joseph Graves was last seen one month ago. He has been hitting an average of 137 grams of protein each day, and he is under 1700 calories for the past week. He is experiencing increased snacking in the evenings, which he is tracking the calories on for the most part. He reports that he tracks mostly everything and is in his caloric range. He has been sleeping well, and he has not been experiencing any stress. Joseph Graves has also been drinking 60 ounces of water each day.   Current Meal Plan: keeping a food journal and adhering to recommended goals of 2200 calories and 120 grams of protein for 95% of the time.  Current Exercise Plan: walking for 20 minutes 2 times per week.  Assessment/Plan:  1. Prediabetes At goal. Goal is HgbA1c < 5.7.  Medication: Rybelsus 3mg  daily & Metformin 500mg  BID.    Plan:  He will continue to focus on protein-rich, low simple carbohydrate foods. We reviewed the importance of hydration, regular exercise for stress reduction, and restorative sleep. Due to increased snacking and drug history, we will try to increase his dose of Rybelsus from 3mg  to 7mg  daily.Historically, he didn't do well, and importance was stressed on decreasing carbs and staying on his meal plan. We discussed his A1c and fasting insulin levels today as well.  Lab Results  Component Value Date   HGBA1C 5.4 02/06/2021   Lab Results  Component Value Date   INSULIN 15.5 02/06/2021   INSULIN 31.0 (H) 08/01/2020   INSULIN 14.8 02/14/2020   INSULIN 41.0 (H)  10/19/2019   Start medications as per below  - Start: Semaglutide 7 MG TABS; Take 1 tablet by mouth once daily  Dispense: 30 tablet; Refill: 0  2. Essential hypertension At goal. Medications: HCTZ 12.5 daily & Losartan 100mg  daily.  Plan: Continue all medications as prescribed.  Avoid buying foods that are: processed, frozen, or prepackaged to avoid excess salt. We will continue to monitor closely alongside his PCP and/or Specialist.  Regular follow up with PCP and specialists was also encouraged.   BP Readings from Last 3 Encounters:  03/04/21 125/68  02/06/21 102/66  01/16/21 117/77   Lab Results  Component Value Date   CREATININE 0.96 08/01/2020   3. Vitamin D deficiency At goal. Current vitamin D is 56.0, tested on 02/06/21. Optimal goal > 50 ng/dL. Plan: Continue to take prescription Vitamin D @50 ,000 IU every week as prescribed.  Follow-up for routine testing of Vitamin D, at least 2-3 times per year to avoid over-replacement. We also discussed Joseph Graves's lab's pertaining to his Vitamin D level today.  4. At risk for dehydration Joseph Graves is at higher than average risk of dehydration.  Joseph Graves was given more than 10 minutes of proper hydration counseling today.  We discussed the signs and symptoms of dehydration, some of which may include muscle cramping, constipation or even orthostatic symptoms.  Counseling on the prevention of dehydration was also provided today.  Joseph Graves is at risk for dehydration due to weight loss, lifestyle and behavorial habits  and possibly due to taking certain medication(s).  He was encouraged to adequately hydrate and monitor fluid status to avoid dehydration as well as weight loss plateaus.  Unless pre-existing renal or cardiopulmonary conditions exist, in which patient was told to limit their fluid intake, I recommended roughly one half of their weight in pounds to be the approximate ounces of non-caloric, non-caffeinated beverages they should drink per day;  including more if they are engaging in exercise.   5. Current BMI 40  Course: Joseph Graves is currently in the action stage of change. As such, his goal is to continue with weight loss efforts.   Nutrition goals: He has agreed to keeping a food journal and adhering to recommended goals of 2200 calories and 120 grams of protein.   Exercise goals: For substantial health benefits, adults should do at least 150 minutes (2 hours and 30 minutes) a week of moderate-intensity, or 75 minutes (1 hour and 15 minutes) a week of vigorous-intensity aerobic physical activity, or an equivalent combination of moderate- and vigorous-intensity aerobic activity. Aerobic activity should be performed in episodes of at least 10 minutes, and preferably, it should be spread throughout the week.  Behavioral modification strategies: increasing water intake, meal planning and cooking strategies, avoiding temptations, and keeping a strict food journal.  Joseph Graves has agreed to follow-up with our clinic in 4 weeks. He was informed of the importance of frequent follow-up visits to maximize his success with intensive lifestyle modifications for his multiple health conditions.   Objective:   Blood pressure 125/68, pulse 64, temperature 97.7 F (36.5 C), height 6\' 4"  (1.93 m), weight (!) 328 lb (148.8 kg), SpO2 99 %. Body mass index is 39.93 kg/m.  General: Cooperative, alert, well developed, in no acute distress. HEENT: Conjunctivae and lids unremarkable. Cardiovascular: Regular rhythm.  Lungs: Normal work of breathing. Neurologic: No focal deficits.   Lab Results  Component Value Date   CREATININE 0.96 08/01/2020   BUN 16 08/01/2020   NA 141 08/01/2020   K 4.4 08/01/2020   CL 103 08/01/2020   CO2 25 08/01/2020   Lab Results  Component Value Date   ALT 22 08/01/2020   AST 17 08/01/2020   ALKPHOS 73 08/01/2020   BILITOT 0.6 08/01/2020   Lab Results  Component Value Date   HGBA1C 5.4 02/06/2021   HGBA1C 5.6  08/01/2020   HGBA1C 5.3 02/14/2020   HGBA1C 5.8 (H) 10/19/2019   HGBA1C 5.5 06/07/2018   Lab Results  Component Value Date   INSULIN 15.5 02/06/2021   INSULIN 31.0 (H) 08/01/2020   INSULIN 14.8 02/14/2020   INSULIN 41.0 (H) 10/19/2019   Lab Results  Component Value Date   TSH 2.130 08/01/2020   Lab Results  Component Value Date   CHOL 131 08/01/2020   HDL 52 08/01/2020   LDLCALC 63 08/01/2020   TRIG 83 08/01/2020   CHOLHDL 3.6 06/07/2018   Lab Results  Component Value Date   WBC 6.1 06/07/2018   HGB 14.2 06/07/2018   HCT 42.4 06/07/2018   MCV 90 06/07/2018   PLT 311 06/07/2018   No results found for: IRON, TIBC, FERRITIN Attestation Statements:   Reviewed by clinician on day of visit: allergies, medications, problem list, medical history, surgical history, family history, social history, and previous encounter notes.  I, 06/09/2018, LPN am acting as Tiffany Kocher for Energy manager, DO.  I have reviewed the above documentation for accuracy and completeness, and I agree with the above. -  [employer],  D.O.  The 21st Century Cures Act was signed into law in 2016 which includes the topic of electronic health records.  This provides immediate access to information in MyChart.  This includes consultation notes, operative notes, office notes, lab results and pathology reports.  If you have any questions about what you read please let us know at your next visit so we can discuss your concerns and take corrective action if need be.  We are right here with you.

## 2021-03-12 ENCOUNTER — Encounter: Payer: 59 | Admitting: Neurology

## 2021-03-12 ENCOUNTER — Other Ambulatory Visit (HOSPITAL_BASED_OUTPATIENT_CLINIC_OR_DEPARTMENT_OTHER): Payer: Self-pay

## 2021-03-12 ENCOUNTER — Other Ambulatory Visit (HOSPITAL_COMMUNITY): Payer: Self-pay

## 2021-03-12 MED FILL — Hydrochlorothiazide Cap 12.5 MG: ORAL | 90 days supply | Qty: 90 | Fill #0 | Status: AC

## 2021-03-27 ENCOUNTER — Other Ambulatory Visit: Payer: Self-pay

## 2021-03-27 ENCOUNTER — Ambulatory Visit (INDEPENDENT_AMBULATORY_CARE_PROVIDER_SITE_OTHER): Payer: 59 | Admitting: Neurology

## 2021-03-27 DIAGNOSIS — R200 Anesthesia of skin: Secondary | ICD-10-CM

## 2021-03-27 NOTE — Procedures (Signed)
[facility]  [address] [place], [address]  [place], [place] [address] Tel: [phone] Fax:  [phone] Test Date:  03/27/2021  Patient: Joseph Graves DOB: 1971-05-20 Physician: Nita Sickle, DO  Sex: Male Height: 6\' 4"  Ref Phys: , PA-C  ID#: [id number]   Technician:    Patient Complaints: This is a 50 year old man referred for evaluation of bilateral feet numbness  NCV & EMG Findings: Electrodiagnostic testing of the right lower extremity and additional studies of the left shows: Bilateral sural and superficial peroneal sensory responses are within normal limits. Bilateral peroneal and tibial motor responses are within normal limits. Bilateral tibial H reflex studies are within normal limits. There is no evidence of active or chronic motor axonal changes affecting any of the tested muscles.  Motor unit configuration and recruitment pattern is within normal limits.  Impression: This is a normal study of the lower extremities.  In particular, there is no evidence of a large fiber sensorimotor polyneuropathy or lumbosacral radiculopathy.   ___________________________ 44, DO    Nerve Conduction Studies Anti Sensory Summary Table   Stim Site NR Peak (ms) Norm Peak (ms) P-T Amp (V) Norm P-T Amp  Left Sup Peroneal Anti Sensory (Ant Lat Mall)  34C  12 cm    2.6 <4.6 7.6 >4  Right Sup Peroneal Anti Sensory (Ant Lat Mall)  34C  12 cm    2.9 <4.6 7.9 >4  Left Sural Anti Sensory (Lat Mall)  34C  Calf    3.3 <4.6 6.2 >4  Right Sural Anti Sensory (Lat Mall)  34C  Calf    3.3 <4.6 5.0 >4   Motor Summary Table   Stim Site NR Onset (ms) Norm Onset (ms) O-P Amp (mV) Norm O-P Amp Site1 Site2 Delta-0 (ms) Dist (cm) Vel (m/s) Norm Vel (m/s)  Left Peroneal Motor (Ext Dig Brev)  34C  Ankle    3.8 <6.0 4.7 >2.5 B Fib Ankle 8.9 42.0 47 >40  B Fib    12.7  4.6  Poplt B Fib 1.8 10.0 56 >40  Poplt    14.5  4.3         Right Peroneal Motor (Ext Dig Brev)   34C  Ankle    4.0 <6.0 4.5 >2.5 B Fib Ankle 8.4 41.0 49 >40  B Fib    12.4  4.1  Poplt B Fib 2.0 10.0 50 >40  Poplt    14.4  3.9         Left Tibial Motor (Abd Hall Brev)  34C  Ankle    3.9 <6.0 8.0 >4 Knee Ankle 10.8 44.0 41 >40  Knee    14.7  6.4         Right Tibial Motor (Abd Hall Brev)  34C  Ankle    3.3 <6.0 8.0 >4 Knee Ankle 9.7 44.0 45 >40  Knee    13.0  5.0          H Reflex Studies   NR H-Lat (ms) Lat Norm (ms) L-R H-Lat (ms)  Left Tibial (Gastroc)  34C     34.92 <35 0.00  Right Tibial (Gastroc)  34C     34.92 <35 0.00   EMG   Side Muscle Ins Act Fibs Psw Fasc Number Recrt Dur Dur. Amp Amp. Poly Poly. Comment  Right AntTibialis Nml Nml Nml Nml Nml Nml Nml Nml Nml Nml Nml Nml N/A  Right Gastroc Nml Nml Nml Nml Nml Nml Nml Nml Nml Nml Nml  Nml N/A  Right Flex Dig Long Nml Nml Nml Nml Nml Nml Nml Nml Nml Nml Nml Nml N/A  Right RectFemoris Nml Nml Nml Nml Nml Nml Nml Nml Nml Nml Nml Nml N/A  Right GluteusMed Nml Nml Nml Nml Nml Nml Nml Nml Nml Nml Nml Nml N/A  Left AntTibialis Nml Nml Nml Nml Nml Nml Nml Nml Nml Nml Nml Nml N/A  Left Gastroc Nml Nml Nml Nml Nml Nml Nml Nml Nml Nml Nml Nml N/A  Left Flex Dig Long Nml Nml Nml Nml Nml Nml Nml Nml Nml Nml Nml Nml N/A  Left RectFemoris Nml Nml Nml Nml Nml Nml Nml Nml Nml Nml Nml Nml N/A  Left GluteusMed Nml Nml Nml Nml Nml Nml Nml Nml Nml Nml Nml Nml N/A      Waveforms:

## 2021-04-01 ENCOUNTER — Ambulatory Visit (INDEPENDENT_AMBULATORY_CARE_PROVIDER_SITE_OTHER): Payer: 59 | Admitting: Family Medicine

## 2021-04-01 ENCOUNTER — Encounter (INDEPENDENT_AMBULATORY_CARE_PROVIDER_SITE_OTHER): Payer: Self-pay | Admitting: Family Medicine

## 2021-04-01 ENCOUNTER — Other Ambulatory Visit (HOSPITAL_COMMUNITY): Payer: Self-pay

## 2021-04-01 ENCOUNTER — Other Ambulatory Visit: Payer: Self-pay

## 2021-04-01 VITALS — BP 122/81 | HR 64 | Temp 97.6°F | Ht 76.0 in | Wt 324.0 lb

## 2021-04-01 DIAGNOSIS — E786 Lipoprotein deficiency: Secondary | ICD-10-CM | POA: Diagnosis not present

## 2021-04-01 DIAGNOSIS — R7303 Prediabetes: Secondary | ICD-10-CM | POA: Diagnosis not present

## 2021-04-01 DIAGNOSIS — Z6841 Body Mass Index (BMI) 40.0 and over, adult: Secondary | ICD-10-CM | POA: Diagnosis not present

## 2021-04-01 DIAGNOSIS — Z9189 Other specified personal risk factors, not elsewhere classified: Secondary | ICD-10-CM

## 2021-04-01 MED ORDER — RYBELSUS 14 MG PO TABS
14.0000 mg | ORAL_TABLET | Freq: Every day | ORAL | 0 refills | Status: DC
  Filled 2021-04-01: qty 30, 30d supply, fill #0

## 2021-04-11 NOTE — Progress Notes (Signed)
Chief Complaint:   OBESITY Joseph Graves is here to discuss his progress with his obesity treatment plan along with follow-up of his obesity related diagnoses.   Today's visit was #: 25 Starting weight: 364 lbs Starting date: 10/19/2019 Today's weight: 324 lbs Today's date: 04/01/2021 Weight change since last visit: 4 lbs Total lbs lost to date: 40 lbs Body mass index is 39.44 kg/m.  Total weight loss percentage to date: -10.99%  Interim History:  Joseph Graves is getting in 91 grams of protein per day on average.  This past week, he has averaged 66 grams of protein per day.  Averaging 1900-2000 calories per day.  He states that he lost 4 pounds this time versus his 2 pound gain at last office visit because he is eating better.  He is eating less sugars and fast foods in general.  Plan:  RMR 3201 on 10/19/2019.  Will recheck at next office visit.   Current Meal Plan: keeping a food journal and adhering to recommended goals of 2200 calories and 120 grams of protein for 90% of the time.  Current Exercise Plan: Walking for 10 minutes 7 times per week. Current Anti-Obesity Medications: Rybelsus 7 mg daily. Side effects: None.  Assessment/Plan:   Medications Discontinued During This Encounter  Medication Reason   Semaglutide 7 MG TABS Dose change   Meds ordered this encounter  Medications   Semaglutide (RYBELSUS) 14 MG TABS    Sig: Take 1 tablet by mouth daily.    Dispense:  30 tablet    Refill:  0    1. Prediabetes At goal. Goal is HgbA1c < 5.7.  Medication: Rybelsus 7 mg daily.  Tolerating well and helps with decreasing simple carb intake.  Gets some nausea if eats too many sweets.  He would like to increase the dose.  Plan:  He will continue to focus on protein-rich, low simple carbohydrate foods. We reviewed the importance of hydration, regular exercise for stress reduction, and restorative sleep.  Increase Rybelsus from 7 to 14 mg after our discussion.  Increase exercise.  Continue  weight loss.  Lab Results  Component Value Date   HGBA1C 5.4 02/06/2021   Lab Results  Component Value Date   INSULIN 15.5 02/06/2021   INSULIN 31.0 (H) 08/01/2020   INSULIN 14.8 02/14/2020   INSULIN 41.0 (H) 10/19/2019   - Increase and refill Semaglutide (RYBELSUS) 14 MG TABS; Take 1 tablet by mouth daily.  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  In the past, HDL was in the 50s.  With increased activity, it was at 52 around 8 months ago.  Plan: Again, increase exercise to a goal of 20 minutes 7 days per week.  Lab Results  Component Value Date   CHOL 131 08/01/2020   HDL 52 08/01/2020   LDLCALC 63 08/01/2020   TRIG 83 08/01/2020   CHOLHDL 3.6 06/07/2018   Lab Results  Component Value Date   ALT 22 08/01/2020   AST 17 08/01/2020   ALKPHOS 73 08/01/2020   BILITOT 0.6 08/01/2020   The 10-year ASCVD risk score Denman George DC Jr., et al., 2013) is: 2.2%   Values used to calculate the score:     Age: 50 years     Sex: Male     Is Non-Hispanic African American: No     Diabetic: No     Tobacco smoker: No     Systolic Blood Pressure: 122 mmHg  Is BP treated: Yes     HDL Cholesterol: 52 mg/dL     Total Cholesterol: 131 mg/dL  3. At risk for side effect of medication Due to Joseph Graves's current conditions and medications, he is at a higher risk for drug side effect.  At least 8 minutes was spent on counseling him about these concerns today.  We discussed the benefits and potential risks of these medications, and all of patient's concerns were addressed and questions were answered.  he will call us, or their PCP or other specialists who treat their conditions with medications, with any questions or concerns that may develop.     4. Class 3 severe obesity with serious comorbidity and body mass index (BMI) of 40.0 to 44.9 in adult, unspecified obesity type (HCC)  Course: Joseph Graves is currently in the action stage of change. As such, his goal is to  continue with weight loss efforts.   Nutrition goals: He has agreed to keeping a food journal and adhering to recommended goals of 2200 calories and 120 grams of protein.   Exercise goals:  Increase to 20 minutes 7 days per week.  Behavioral modification strategies: increasing lean protein intake and decreasing simple carbohydrates.  Joseph Graves has agreed to follow-up with our clinic in 4 weeks, fasting. He was informed of the importance of frequent follow-up visits to maximize his success with intensive lifestyle modifications for his multiple health conditions.   Objective:   Blood pressure 122/81, pulse 64, temperature 97.6 F (36.4 C), height 6\' 4"  (1.93 m), weight (!) 324 lb (147 kg), SpO2 99 %. Body mass index is 39.44 kg/m.  General: Cooperative, alert, well developed, in no acute distress. HEENT: Conjunctivae and lids unremarkable. Cardiovascular: Regular rhythm.  Lungs: Normal work of breathing. Neurologic: No focal deficits.   Lab Results  Component Value Date   CREATININE 0.96 08/01/2020   BUN 16 08/01/2020   NA 141 08/01/2020   K 4.4 08/01/2020   CL 103 08/01/2020   CO2 25 08/01/2020   Lab Results  Component Value Date   ALT 22 08/01/2020   AST 17 08/01/2020   ALKPHOS 73 08/01/2020   BILITOT 0.6 08/01/2020   Lab Results  Component Value Date   HGBA1C 5.4 02/06/2021   HGBA1C 5.6 08/01/2020   HGBA1C 5.3 02/14/2020   HGBA1C 5.8 (H) 10/19/2019   HGBA1C 5.5 06/07/2018   Lab Results  Component Value Date   INSULIN 15.5 02/06/2021   INSULIN 31.0 (H) 08/01/2020   INSULIN 14.8 02/14/2020   INSULIN 41.0 (H) 10/19/2019   Lab Results  Component Value Date   TSH 2.130 08/01/2020   Lab Results  Component Value Date   CHOL 131 08/01/2020   HDL 52 08/01/2020   LDLCALC 63 08/01/2020   TRIG 83 08/01/2020   CHOLHDL 3.6 06/07/2018   Lab Results  Component Value Date   VD25OH 56.0 02/06/2021   VD25OH 56.1 08/01/2020   VD25OH 72.6 02/14/2020   Lab Results   Component Value Date   WBC 6.1 06/07/2018   HGB 14.2 06/07/2018   HCT 42.4 06/07/2018   MCV 90 06/07/2018   PLT 311 06/07/2018   Attestation Statements:   Reviewed by clinician on day of visit: allergies, medications, problem list, medical history, surgical history, family history, social history, and previous encounter notes.  I, 06/09/2018, CMA, am acting as Insurance claims handler for Energy manager, DO.  I have reviewed the above documentation for accuracy and completeness, and I agree with the above. -  Marjory Sneddon, D.O.  The [place] was signed into law in 2016 which includes the topic of electronic health records.  This provides immediate access to information in MyChart.  This includes consultation notes, operative notes, office notes, lab results and pathology reports.  If you have any questions about what you read please let us know at your next visit so we can discuss your concerns and take corrective action if need be.  We are right here with you.

## 2021-04-29 ENCOUNTER — Ambulatory Visit (INDEPENDENT_AMBULATORY_CARE_PROVIDER_SITE_OTHER): Payer: 59 | Admitting: Family Medicine

## 2021-04-29 ENCOUNTER — Other Ambulatory Visit (HOSPITAL_COMMUNITY): Payer: Self-pay

## 2021-04-29 ENCOUNTER — Encounter (INDEPENDENT_AMBULATORY_CARE_PROVIDER_SITE_OTHER): Payer: Self-pay | Admitting: Family Medicine

## 2021-04-29 ENCOUNTER — Other Ambulatory Visit: Payer: Self-pay

## 2021-04-29 ENCOUNTER — Other Ambulatory Visit: Payer: Self-pay | Admitting: Physician Assistant

## 2021-04-29 VITALS — BP 105/67 | HR 64 | Temp 97.8°F | Ht 76.0 in | Wt 323.0 lb

## 2021-04-29 DIAGNOSIS — R7303 Prediabetes: Secondary | ICD-10-CM | POA: Diagnosis not present

## 2021-04-29 DIAGNOSIS — N521 Erectile dysfunction due to diseases classified elsewhere: Secondary | ICD-10-CM

## 2021-04-29 DIAGNOSIS — Z9189 Other specified personal risk factors, not elsewhere classified: Secondary | ICD-10-CM | POA: Diagnosis not present

## 2021-04-29 DIAGNOSIS — I10 Essential (primary) hypertension: Secondary | ICD-10-CM | POA: Diagnosis not present

## 2021-04-29 DIAGNOSIS — E349 Endocrine disorder, unspecified: Secondary | ICD-10-CM

## 2021-04-29 DIAGNOSIS — Z6841 Body Mass Index (BMI) 40.0 and over, adult: Secondary | ICD-10-CM | POA: Diagnosis not present

## 2021-04-29 MED ORDER — RYBELSUS 14 MG PO TABS
14.0000 mg | ORAL_TABLET | Freq: Every day | ORAL | 0 refills | Status: DC
  Filled 2021-04-29: qty 30, 30d supply, fill #0

## 2021-04-29 MED ORDER — TADALAFIL 20 MG PO TABS
ORAL_TABLET | ORAL | 1 refills | Status: DC
  Filled 2021-04-29: qty 6, 30d supply, fill #0
  Filled 2021-07-24: qty 6, 30d supply, fill #1

## 2021-04-30 NOTE — Progress Notes (Signed)
Chief Complaint:   OBESITY Joseph Graves is here to discuss his progress with his obesity treatment plan along with follow-up of his obesity related diagnoses. Joseph Graves is on keeping a food journal and adhering to recommended goals of 2200 calories and 120 grams protein and states he is following his eating plan approximately 100% of the time. Joseph Graves states he is not currently exercising.  Today's visit was #: 26 Starting weight: 364 lbs Starting date: 10/19/2019 Today's weight: 323 lbs Today's date: 04/29/2021 Total lbs lost to date: 41 Total lbs lost since last in-office visit: 1  Interim History: Joseph Graves's protein average per day is 91, and he is at 2,000 calories per day. Over a week he averages under 2,000 calories. He has no concerns with plan.  Subjective:   1. Pre-diabetes Joseph Graves denies issues with Rybelsus but at times needs to remember to eat. He denies side effects or issues and desires to stay at the same dose.  2. Essential hypertension Joseph Graves is on HCTZ and Cozaar.  BP Readings from Last 3 Encounters:  04/29/21 105/67  04/01/21 122/81  03/04/21 125/68   Lab Results  Component Value Date   CREATININE 0.96 08/01/2020   CREATININE 0.90 02/14/2020   CREATININE 0.94 10/19/2019   Assessment/Plan:   1. Pre-diabetes Joseph Graves will continue to work on weight loss, exercise, and decreasing simple carbohydrates to help decrease the risk of diabetes.   Refill- Semaglutide (RYBELSUS) 14 MG TABS; Take 1 tablet by mouth daily.  Dispense: 30 tablet; Refill: 0  2. Essential hypertension Joseph Graves's BP is at goal. He is working on healthy weight loss and exercise to improve blood pressure control. We will watch for signs of hypotension as he continues his lifestyle modifications.  3. At risk for malnutrition Joseph Graves was given extensive malnutrition prevention education and counseling today of more than 9 minutes. Counseled him that malnutrition refers to inappropriate nutrients or not  the right balance of nutrients for optimal health. Discussed with Joseph Graves that it is absolutely possible to be malnourished but yet obese. Risk factors, including but not limited to, inappropriate dietary choices, difficulty with obtaining food due to physical or financial limitations, and various physical and mental health conditions were reviewed with Joseph Graves.   4. Obesity with current BMI 39.3  Joseph Graves is currently in the action stage of change. As such, his goal is to continue with weight loss efforts. He has agreed to keeping a food journal and adhering to recommended goals of 2200 calories and 120 grams protein.   Exercise goals:  Start walking 20 minutes 3 days a week.  Behavioral modification strategies: increasing lean protein intake, decreasing simple carbohydrates, no skipping meals, and planning for success.  Joseph Graves has agreed to follow-up with our clinic in 3-4 weeks. He was informed of the importance of frequent follow-up visits to maximize his success with intensive lifestyle modifications for his multiple health conditions.   Objective:   Blood pressure 105/67, pulse 64, temperature 97.8 F (36.6 C), height 6\' 4"  (1.93 m), weight (!) 323 lb (146.5 kg), SpO2 98 %. Body mass index is 39.32 kg/m.  General: Cooperative, alert, well developed, in no acute distress. HEENT: Conjunctivae and lids unremarkable. Cardiovascular: Regular rhythm.  Lungs: Normal work of breathing. Neurologic: No focal deficits.   Lab Results  Component Value Date   CREATININE 0.96 08/01/2020   BUN 16 08/01/2020   NA 141 08/01/2020   K 4.4 08/01/2020   CL 103 08/01/2020  CO2 25 08/01/2020   Lab Results  Component Value Date   ALT 22 08/01/2020   AST 17 08/01/2020   ALKPHOS 73 08/01/2020   BILITOT 0.6 08/01/2020   Lab Results  Component Value Date   HGBA1C 5.4 02/06/2021   HGBA1C 5.6 08/01/2020   HGBA1C 5.3 02/14/2020   HGBA1C 5.8 (H) 10/19/2019   HGBA1C 5.5 06/07/2018    Lab Results  Component Value Date   INSULIN 15.5 02/06/2021   INSULIN 31.0 (H) 08/01/2020   INSULIN 14.8 02/14/2020   INSULIN 41.0 (H) 10/19/2019   Lab Results  Component Value Date   TSH 2.130 08/01/2020   Lab Results  Component Value Date   CHOL 131 08/01/2020   HDL 52 08/01/2020   LDLCALC 63 08/01/2020   TRIG 83 08/01/2020   CHOLHDL 3.6 06/07/2018   Lab Results  Component Value Date   VD25OH 56.0 02/06/2021   VD25OH 56.1 08/01/2020   VD25OH 72.6 02/14/2020   Lab Results  Component Value Date   WBC 6.1 06/07/2018   HGB 14.2 06/07/2018   HCT 42.4 06/07/2018   MCV 90 06/07/2018   PLT 311 06/07/2018   Attestation Statements:   Reviewed by clinician on day of visit: allergies, medications, problem list, medical history, surgical history, family history, social history, and previous encounter notes.  Edmund Hilda, CMA, am acting as transcriptionist for [employer], DO.  I have reviewed the above documentation for accuracy and completeness, and I agree with the above. Carlye Grippe, D.O.  The 21st Century Cures Act was signed into law in 2016 which includes the topic of electronic health records.  This provides immediate access to information in MyChart.  This includes consultation notes, operative notes, office notes, lab results and pathology reports.  If you have any questions about what you read please let us know at your next visit so we can discuss your concerns and take corrective action if need be.  We are right here with you.

## 2021-05-06 ENCOUNTER — Telehealth: Payer: 59 | Admitting: Physician Assistant

## 2021-05-21 ENCOUNTER — Other Ambulatory Visit (HOSPITAL_COMMUNITY): Payer: Self-pay

## 2021-05-21 MED FILL — Bupropion HCl Tab ER 24HR 150 MG: ORAL | 90 days supply | Qty: 180 | Fill #1 | Status: AC

## 2021-05-29 ENCOUNTER — Ambulatory Visit (INDEPENDENT_AMBULATORY_CARE_PROVIDER_SITE_OTHER): Payer: 59 | Admitting: Family Medicine

## 2021-05-29 ENCOUNTER — Other Ambulatory Visit: Payer: Self-pay

## 2021-05-29 ENCOUNTER — Other Ambulatory Visit (HOSPITAL_COMMUNITY): Payer: Self-pay

## 2021-05-29 ENCOUNTER — Encounter (INDEPENDENT_AMBULATORY_CARE_PROVIDER_SITE_OTHER): Payer: Self-pay | Admitting: Family Medicine

## 2021-05-29 VITALS — BP 112/75 | HR 63 | Temp 97.7°F | Ht 76.0 in | Wt 315.0 lb

## 2021-05-29 DIAGNOSIS — R7303 Prediabetes: Secondary | ICD-10-CM | POA: Diagnosis not present

## 2021-05-29 DIAGNOSIS — Z6841 Body Mass Index (BMI) 40.0 and over, adult: Secondary | ICD-10-CM | POA: Diagnosis not present

## 2021-05-29 DIAGNOSIS — Z9189 Other specified personal risk factors, not elsewhere classified: Secondary | ICD-10-CM

## 2021-05-29 MED ORDER — RYBELSUS 14 MG PO TABS
14.0000 mg | ORAL_TABLET | Freq: Every day | ORAL | 0 refills | Status: DC
  Filled 2021-05-29: qty 30, 30d supply, fill #0

## 2021-05-29 NOTE — Progress Notes (Signed)
Chief Complaint:   OBESITY Joseph Graves is here to discuss his progress with his obesity treatment plan along with follow-up of his obesity related diagnoses. Joseph Graves is on keeping a food journal and adhering to recommended goals of 2000 calories and 115 grams protein and states he is following his eating plan approximately 80% of the time. [name] states he is walking 20 minutes 3 times per week.  Today's visit was #: 27 Starting weight: 364 lbs Starting date: 10/19/2019 Today's weight: 315 lbs Today's date: 05/29/2021 Total lbs lost to date: 49 Total lbs lost since last in-office visit: 8  Interim History: [name] has been journaling most days, except for a couple of days when he had a bad GI bug and didn't eat much. His average calorie intake per day was 1400-1500 and 80-90 grams of protein.  Subjective:   1. Pre-diabetes At goal. Goal is HgbA1c < 5.7.  Medication: Rybelsus.    Lab Results  Component Value Date   HGBA1C 5.4 02/06/2021   Lab Results  Component Value Date   INSULIN 15.5 02/06/2021   INSULIN 31.0 (H) 08/01/2020   INSULIN 14.8 02/14/2020   INSULIN 41.0 (H) 10/19/2019   2. At risk for deficient intake of food Berley is at risk for deficient intake of food due to poor calorie intake.  Assessment/Plan:  No orders of the defined types were placed in this encounter.   Medications Discontinued During This Encounter  Medication Reason   acetaminophen (TYLENOL) 325 MG tablet Error   ondansetron (ZOFRAN) 4 MG tablet Error   Semaglutide (RYBELSUS) 14 MG TABS Reorder     Meds ordered this encounter  Medications   Semaglutide (RYBELSUS) 14 MG TABS    Sig: Take 1 tablet by mouth daily.    Dispense:  30 tablet    Refill:  0     1. Pre-diabetes He will continue to focus on protein-rich, low simple carbohydrate foods. We reviewed the importance of hydration, regular exercise for stress reduction, and restorative sleep.   Refill- Semaglutide (RYBELSUS) 14 MG TABS;  Take 1 tablet by mouth daily.  Dispense: 30 tablet; Refill: 0  2. At risk for deficient intake of food Joseph Graves was given extensive education and counseling today of more than 9 minutes on risks associated with deficient food intake.  Counseled him on the importance of following our prescribed meal plan and eating adequate amounts of protein.  Discussed with Joseph Graves that inadequate food intake over longer periods of time can slow their metabolism down significantly.   3. Obesity with current BMI of 38.4  [name] is currently in the action stage of change. As such, his goal is to continue with weight loss efforts. He has agreed to keeping a food journal and adhering to recommended goals of 2200 calories and 120 grams protein.   Bring journaling log of calories and proteins to next OV.  Exercise goals:  Walk 20 minutes 3 days a week but add some strength training.  Behavioral modification strategies: increasing lean protein intake, decreasing simple carbohydrates, and planning for success.  [name] has agreed to follow-up with our clinic in 2-3 weeks. He was informed of the importance of frequent follow-up visits to maximize his success with intensive lifestyle modifications for his multiple health conditions.   Objective:   Blood pressure 112/75, pulse 63, temperature 97.7 F (36.5 C), height 6\' 4"  (1.93 m), weight (!) 315 lb (142.9 kg), SpO2 99 %. Body mass index is 38.34 kg/m.  General: Cooperative, alert, well developed, in no acute distress. HEENT: Conjunctivae and lids unremarkable. Cardiovascular: Regular rhythm.  Lungs: Normal work of breathing. Neurologic: No focal deficits.   Lab Results  Component Value Date   CREATININE 0.96 08/01/2020   BUN 16 08/01/2020   NA 141 08/01/2020   K 4.4 08/01/2020   CL 103 08/01/2020   CO2 25 08/01/2020   Lab Results  Component Value Date   ALT 22 08/01/2020   AST 17 08/01/2020   ALKPHOS 73 08/01/2020   BILITOT 0.6 08/01/2020    Lab Results  Component Value Date   HGBA1C 5.4 02/06/2021   HGBA1C 5.6 08/01/2020   HGBA1C 5.3 02/14/2020   HGBA1C 5.8 (H) 10/19/2019   HGBA1C 5.5 06/07/2018   Lab Results  Component Value Date   INSULIN 15.5 02/06/2021   INSULIN 31.0 (H) 08/01/2020   INSULIN 14.8 02/14/2020   INSULIN 41.0 (H) 10/19/2019   Lab Results  Component Value Date   TSH 2.130 08/01/2020   Lab Results  Component Value Date   CHOL 131 08/01/2020   HDL 52 08/01/2020   LDLCALC 63 08/01/2020   TRIG 83 08/01/2020   CHOLHDL 3.6 06/07/2018   Lab Results  Component Value Date   VD25OH 56.0 02/06/2021   VD25OH 56.1 08/01/2020   VD25OH 72.6 02/14/2020   Lab Results  Component Value Date   WBC 6.1 06/07/2018   HGB 14.2 06/07/2018   HCT 42.4 06/07/2018   MCV 90 06/07/2018   PLT 311 06/07/2018    Attestation Statements:   Reviewed by clinician on day of visit: allergies, medications, problem list, medical history, surgical history, family history, social history, and previous encounter notes.  Edmund Hilda, CMA, am acting as transcriptionist for [employer], DO.  I have reviewed the above documentation for accuracy and completeness, and I agree with the above. Carlye Grippe, D.O.  The 21st Century Cures Act was signed into law in 2016 which includes the topic of electronic health records.  This provides immediate access to information in MyChart.  This includes consultation notes, operative notes, office notes, lab results and pathology reports.  If you have any questions about what you read please let us know at your next visit so we can discuss your concerns and take corrective action if need be.  We are right here with you.

## 2021-06-04 ENCOUNTER — Other Ambulatory Visit: Payer: Self-pay | Admitting: Physician Assistant

## 2021-06-04 DIAGNOSIS — I10 Essential (primary) hypertension: Secondary | ICD-10-CM

## 2021-06-04 MED FILL — Montelukast Sodium Tab 10 MG (Base Equiv): ORAL | 90 days supply | Qty: 90 | Fill #1 | Status: AC

## 2021-06-05 ENCOUNTER — Other Ambulatory Visit (HOSPITAL_COMMUNITY): Payer: Self-pay

## 2021-06-05 MED ORDER — LOSARTAN POTASSIUM 100 MG PO TABS
100.0000 mg | ORAL_TABLET | Freq: Every day | ORAL | 0 refills | Status: DC
  Filled 2021-06-05: qty 30, 30d supply, fill #0

## 2021-06-12 ENCOUNTER — Other Ambulatory Visit (HOSPITAL_BASED_OUTPATIENT_CLINIC_OR_DEPARTMENT_OTHER): Payer: Self-pay

## 2021-06-12 MED ORDER — INFLUENZA VAC SPLIT QUAD 0.5 ML IM SUSY
PREFILLED_SYRINGE | INTRAMUSCULAR | 0 refills | Status: DC
  Filled 2021-06-12: qty 0.5, 1d supply, fill #0

## 2021-06-24 ENCOUNTER — Encounter (INDEPENDENT_AMBULATORY_CARE_PROVIDER_SITE_OTHER): Payer: Self-pay | Admitting: Family Medicine

## 2021-06-24 ENCOUNTER — Other Ambulatory Visit (HOSPITAL_COMMUNITY): Payer: Self-pay

## 2021-06-24 ENCOUNTER — Other Ambulatory Visit: Payer: Self-pay

## 2021-06-24 ENCOUNTER — Ambulatory Visit (INDEPENDENT_AMBULATORY_CARE_PROVIDER_SITE_OTHER): Payer: 59 | Admitting: Family Medicine

## 2021-06-24 VITALS — BP 110/73 | HR 72 | Temp 97.5°F | Ht 76.0 in | Wt 333.0 lb

## 2021-06-24 DIAGNOSIS — E786 Lipoprotein deficiency: Secondary | ICD-10-CM

## 2021-06-24 DIAGNOSIS — R7303 Prediabetes: Secondary | ICD-10-CM

## 2021-06-24 DIAGNOSIS — Z6841 Body Mass Index (BMI) 40.0 and over, adult: Secondary | ICD-10-CM

## 2021-06-24 DIAGNOSIS — J302 Other seasonal allergic rhinitis: Secondary | ICD-10-CM | POA: Diagnosis not present

## 2021-06-24 DIAGNOSIS — E559 Vitamin D deficiency, unspecified: Secondary | ICD-10-CM | POA: Diagnosis not present

## 2021-06-24 DIAGNOSIS — Z9189 Other specified personal risk factors, not elsewhere classified: Secondary | ICD-10-CM | POA: Diagnosis not present

## 2021-06-24 MED ORDER — LEVOCETIRIZINE DIHYDROCHLORIDE 5 MG PO TABS
ORAL_TABLET | Freq: Every evening | ORAL | 0 refills | Status: DC
  Filled 2021-06-24: qty 30, 30d supply, fill #0

## 2021-06-24 MED ORDER — VITAMIN D3 125 MCG (5000 UT) PO CAPS: ORAL_CAPSULE | ORAL | 0 refills | Status: DC

## 2021-06-24 MED ORDER — METFORMIN HCL ER 500 MG PO TB24
500.0000 mg | ORAL_TABLET | Freq: Every day | ORAL | 0 refills | Status: DC
  Filled 2021-06-24: qty 30, 30d supply, fill #0

## 2021-06-24 NOTE — Progress Notes (Signed)
Chief Complaint:   OBESITY Joseph Graves is here to discuss his progress with his obesity treatment plan along with follow-up of his obesity related diagnoses. Joseph Graves is on keeping a food journal and adhering to recommended goals of 2200 calories and 120 grams of protein daily and states he is following his eating plan approximately 50% of the time. Joseph Graves states he is walking for 20 minutes 3-4 times per week.  Today's visit was #: 28 Starting weight: 364 lbs Starting date: 10/19/2019 Today's weight: 333 lbs Today's date: 06/24/2021 Total lbs lost to date: 31 Total lbs lost since last in-office visit: 0  Interim History: Two office visit ago, we increased Rybelsus approximately 2 months ago. Joseph Graves had bad GERD symptoms with the drug which started 1 month ago. He discontinues Rybelsus in mid September due to his side effects. His symptoms has resolved now (diarrhea, vomiting, and heartburn).     Subjective:   1. Pre-diabetes Joseph Graves has a diagnosis of pre-diabetes based on his elevated HgA1c and was informed this puts him at greater risk of developing diabetes. He continues to work on diet and exercise to decrease his risk of diabetes. He denies nausea or hypoglycemia.  2. Low HDL (under 40) Joseph Graves has hyperlipidemia and has been trying to improve his cholesterol levels with intensive lifestyle modification including a low saturated fat diet, exercise and weight loss. He denies any chest pain, claudication or myalgias.  3. Seasonal allergies Joseph Graves takes Singulair nightly and she occasionally takes Flonase, but she takes Xyzal only when symptoms get bad.  4. Vitamin D deficiency Joseph Graves is currently taking OTC vitamin D 5,000 IU Monday-Friday and 10,000 IU Saturday and Sunday. He denies nausea, vomiting or muscle weakness.  5. At risk for dehydration Joseph Graves is at risk for dehydration due to inadequate water intake.  Assessment/Plan:   Orders Placed This Encounter  Procedures    VITAMIN D 25 Hydroxy (Vit-D Deficiency, Fractures)   Insulin, random   Hemoglobin A1c    Medications Discontinued During This Encounter  Medication Reason   Semaglutide (RYBELSUS) 14 MG TABS Side effect (s)   levocetirizine (XYZAL) 5 MG tablet Reorder   Cholecalciferol (VITAMIN D3) 125 MCG (5000 UT) CAPS Reorder     Meds ordered this encounter  Medications   Cholecalciferol (VITAMIN D3) 125 MCG (5000 UT) CAPS    Sig: 5000 IU Monday-Friday and 10,000 IU on Saturday and Sunday.    Dispense:  30 capsule    Refill:  0   levocetirizine (XYZAL) 5 MG tablet    Sig: TAKE 1 TABLET BY MOUTH EVERY EVENING    Dispense:  30 tablet    Refill:  0   metFORMIN (GLUCOPHAGE XR) 500 MG 24 hr tablet    Sig: Take 1 tablet (500 mg total) by mouth daily with breakfast. Need appointment for further refills    Dispense:  30 tablet    Refill:  0    30 d supply;  ** OV for RF **   Do not send RF request     1. Pre-diabetes Joseph Graves agreed to discontinue Rybelsus and we will add GLP to his allergy list. He will continue metformin and we will refill for 1 month. He will continue to work on weight loss, exercise, and decreasing simple carbohydrates to help decrease the risk of diabetes. We will recheck labs at his next office visit.   - metFORMIN (GLUCOPHAGE XR) 500 MG 24 hr tablet; Take 1 tablet (500 mg total) by mouth  daily with breakfast. Need appointment for further refills  Dispense: 30 tablet; Refill: 0 - Insulin, random - Hemoglobin A1c  2. Low HDL (under 40) Cardiovascular risk and specific lipid/LDL goals reviewed. We discussed several lifestyle modifications today. Joseph Graves declines a lab recheck and will have them obtained with his primary care physician near future. He will increase his exercise. Orders and follow up as documented in patient record.   Counseling Intensive lifestyle modifications are the first line treatment for this issue. Dietary changes: Increase soluble fiber. Decrease simple  carbohydrates. Exercise changes: Moderate to vigorous-intensity aerobic activity 150 minutes per week if tolerated. Lipid-lowering medications: see documented in medical record.  3. Seasonal allergies Joseph Graves will use a N-95 mask and other preventive strategies were discussed with the patient today. We will refill Xyzal for 1 month.  - levocetirizine (XYZAL) 5 MG tablet; TAKE 1 TABLET BY MOUTH EVERY EVENING  Dispense: 30 tablet; Refill: 0  4. Vitamin D deficiency Low Vitamin D level contributes to fatigue and are associated with obesity, breast, and colon cancer. Joseph Graves will continue Vitamin D 5,000 IU Monday-Friday and 10,000 IU Saturday and Sunday. We will recheck labs at his next office visit. He will follow-up for routine testing of Vitamin D, at least 2-3 times per year to avoid over-replacement.  - Cholecalciferol (VITAMIN D3) 125 MCG (5000 UT) CAPS; 5000 IU Monday-Friday and 10,000 IU on Saturday and Sunday.  Dispense: 30 capsule; Refill: 0 - VITAMIN D 25 Hydroxy (Vit-D Deficiency, Fractures)  5. At risk for dehydration Joseph Graves was given approximately 9 minutes dehydration prevention counseling today. Joseph Graves is at risk for dehydration due to weight loss and current medication(s). He was encouraged to hydrate and monitor fluid status to avoid dehydration as well as weight loss plateaus.   6. Obesity with current BMI of 40.6 Joseph Graves is currently in the action stage of change. As such, his goal is to continue with weight loss efforts. He has agreed to keeping a food journal and adhering to recommended goals of 2200-2400 calories and 140+ grams of protein daily.   Joseph Graves is to bring in his food journal log to his next office visit. He is to start increasing his exercise to 30 minutes 5 days per week.  Exercise goals: For substantial health benefits, adults should do at least 150 minutes (2 hours and 30 minutes) a week of moderate-intensity, or 75 minutes (1 hour and 15 minutes) a week of  vigorous-intensity aerobic physical activity, or an equivalent combination of moderate- and vigorous-intensity aerobic activity. Aerobic activity should be performed in episodes of at least 10 minutes, and preferably, it should be spread throughout the week.  Behavioral modification strategies: increasing water intake, meal planning and cooking strategies, and planning for success.  Joseph Graves has agreed to follow-up with our clinic in 3 weeks. He was informed of the importance of frequent follow-up visits to maximize his success with intensive lifestyle modifications for his multiple health conditions.   Objective:   Blood pressure 110/73, pulse 72, temperature (!) 97.5 F (36.4 C), height 6\' 4"  (1.93 m), weight (!) 333 lb (151 kg), SpO2 100 %. Body mass index is 40.53 kg/m.  General: Cooperative, alert, well developed, in no acute distress. HEENT: Conjunctivae and lids unremarkable. Cardiovascular: Regular rhythm.  Lungs: Normal work of breathing. Neurologic: No focal deficits.   Lab Results  Component Value Date   CREATININE 0.96 08/01/2020   BUN 16 08/01/2020   NA 141 08/01/2020   K 4.4 08/01/2020   CL  103 08/01/2020   CO2 25 08/01/2020   Lab Results  Component Value Date   ALT 22 08/01/2020   AST 17 08/01/2020   ALKPHOS 73 08/01/2020   BILITOT 0.6 08/01/2020   Lab Results  Component Value Date   HGBA1C 5.4 02/06/2021   HGBA1C 5.6 08/01/2020   HGBA1C 5.3 02/14/2020   HGBA1C 5.8 (H) 10/19/2019   HGBA1C 5.5 06/07/2018   Lab Results  Component Value Date   INSULIN 15.5 02/06/2021   INSULIN 31.0 (H) 08/01/2020   INSULIN 14.8 02/14/2020   INSULIN 41.0 (H) 10/19/2019   Lab Results  Component Value Date   TSH 2.130 08/01/2020   Lab Results  Component Value Date   CHOL 131 08/01/2020   HDL 52 08/01/2020   LDLCALC 63 08/01/2020   TRIG 83 08/01/2020   CHOLHDL 3.6 06/07/2018   Lab Results  Component Value Date   VD25OH 56.0 02/06/2021   VD25OH 56.1 08/01/2020    VD25OH 72.6 02/14/2020   Lab Results  Component Value Date   WBC 6.1 06/07/2018   HGB 14.2 06/07/2018   HCT 42.4 06/07/2018   MCV 90 06/07/2018   PLT 311 06/07/2018   No results found for: IRON, TIBC, FERRITIN  Attestation Statements:   Reviewed by clinician on day of visit: allergies, medications, problem list, medical history, surgical history, family history, social history, and previous encounter notes.   Trude Mcburney, am acting as transcriptionist for [employer], DO.  I have reviewed the above documentation for accuracy and completeness, and I agree with the above. Carlye Grippe, D.O.  The 21st Century Cures Act was signed into law in 2016 which includes the topic of electronic health records.  This provides immediate access to information in MyChart.  This includes consultation notes, operative notes, office notes, lab results and pathology reports.  If you have any questions about what you read please let us know at your next visit so we can discuss your concerns and take corrective action if need be.  We are right here with you.

## 2021-06-26 ENCOUNTER — Other Ambulatory Visit (HOSPITAL_COMMUNITY): Payer: Self-pay

## 2021-06-26 MED FILL — Hydrochlorothiazide Cap 12.5 MG: ORAL | 90 days supply | Qty: 90 | Fill #1 | Status: AC

## 2021-07-08 ENCOUNTER — Other Ambulatory Visit (HOSPITAL_COMMUNITY): Payer: Self-pay

## 2021-07-08 ENCOUNTER — Other Ambulatory Visit: Payer: Self-pay | Admitting: Physician Assistant

## 2021-07-08 DIAGNOSIS — I10 Essential (primary) hypertension: Secondary | ICD-10-CM

## 2021-07-08 MED ORDER — LOSARTAN POTASSIUM 100 MG PO TABS
100.0000 mg | ORAL_TABLET | Freq: Every day | ORAL | 0 refills | Status: DC
  Filled 2021-07-08: qty 15, 15d supply, fill #0

## 2021-07-18 ENCOUNTER — Other Ambulatory Visit (INDEPENDENT_AMBULATORY_CARE_PROVIDER_SITE_OTHER): Payer: Self-pay | Admitting: Family Medicine

## 2021-07-18 DIAGNOSIS — R7303 Prediabetes: Secondary | ICD-10-CM

## 2021-07-18 NOTE — Telephone Encounter (Signed)
Last OV with Dr Opalski 

## 2021-07-19 ENCOUNTER — Other Ambulatory Visit (HOSPITAL_COMMUNITY): Payer: Self-pay

## 2021-07-24 ENCOUNTER — Other Ambulatory Visit (INDEPENDENT_AMBULATORY_CARE_PROVIDER_SITE_OTHER): Payer: Self-pay | Admitting: Family Medicine

## 2021-07-24 ENCOUNTER — Other Ambulatory Visit (HOSPITAL_COMMUNITY): Payer: Self-pay

## 2021-07-24 DIAGNOSIS — R7303 Prediabetes: Secondary | ICD-10-CM

## 2021-07-24 NOTE — Telephone Encounter (Signed)
Dr. O

## 2021-07-25 ENCOUNTER — Other Ambulatory Visit: Payer: Self-pay | Admitting: Internal Medicine

## 2021-07-25 ENCOUNTER — Other Ambulatory Visit: Payer: Self-pay | Admitting: Physician Assistant

## 2021-07-25 ENCOUNTER — Other Ambulatory Visit (HOSPITAL_COMMUNITY): Payer: Self-pay

## 2021-07-25 DIAGNOSIS — J3089 Other allergic rhinitis: Secondary | ICD-10-CM

## 2021-07-25 DIAGNOSIS — I10 Essential (primary) hypertension: Secondary | ICD-10-CM

## 2021-07-25 DIAGNOSIS — E349 Endocrine disorder, unspecified: Secondary | ICD-10-CM

## 2021-07-25 DIAGNOSIS — N521 Erectile dysfunction due to diseases classified elsewhere: Secondary | ICD-10-CM

## 2021-07-25 MED ORDER — HYDROCHLOROTHIAZIDE 12.5 MG PO CAPS
ORAL_CAPSULE | Freq: Every day | ORAL | 0 refills | Status: DC
  Filled 2021-07-25: qty 30, fill #0

## 2021-07-25 MED ORDER — MONTELUKAST SODIUM 10 MG PO TABS
10.0000 mg | ORAL_TABLET | Freq: Every day | ORAL | 0 refills | Status: DC
  Filled 2021-07-25: qty 30, 30d supply, fill #0

## 2021-07-25 MED ORDER — LOSARTAN POTASSIUM 100 MG PO TABS
100.0000 mg | ORAL_TABLET | Freq: Every day | ORAL | 0 refills | Status: DC
  Filled 2021-07-25: qty 15, 15d supply, fill #0

## 2021-07-25 NOTE — Telephone Encounter (Signed)
Pt last seen by Dr. Opalski.  

## 2021-07-26 DIAGNOSIS — G4733 Obstructive sleep apnea (adult) (pediatric): Secondary | ICD-10-CM | POA: Diagnosis not present

## 2021-07-29 ENCOUNTER — Encounter (INDEPENDENT_AMBULATORY_CARE_PROVIDER_SITE_OTHER): Payer: Self-pay | Admitting: Family Medicine

## 2021-07-29 ENCOUNTER — Other Ambulatory Visit: Payer: Self-pay

## 2021-07-29 ENCOUNTER — Other Ambulatory Visit (HOSPITAL_COMMUNITY): Payer: Self-pay

## 2021-07-29 ENCOUNTER — Ambulatory Visit (INDEPENDENT_AMBULATORY_CARE_PROVIDER_SITE_OTHER): Payer: 59 | Admitting: Family Medicine

## 2021-07-29 VITALS — BP 109/70 | HR 68 | Temp 97.0°F | Ht 76.0 in | Wt 338.0 lb

## 2021-07-29 DIAGNOSIS — J302 Other seasonal allergic rhinitis: Secondary | ICD-10-CM

## 2021-07-29 DIAGNOSIS — R7303 Prediabetes: Secondary | ICD-10-CM | POA: Diagnosis not present

## 2021-07-29 DIAGNOSIS — E559 Vitamin D deficiency, unspecified: Secondary | ICD-10-CM | POA: Diagnosis not present

## 2021-07-29 DIAGNOSIS — Z9189 Other specified personal risk factors, not elsewhere classified: Secondary | ICD-10-CM | POA: Diagnosis not present

## 2021-07-29 DIAGNOSIS — Z6841 Body Mass Index (BMI) 40.0 and over, adult: Secondary | ICD-10-CM

## 2021-07-29 MED ORDER — METFORMIN HCL ER 500 MG PO TB24
1000.0000 mg | ORAL_TABLET | Freq: Every day | ORAL | 0 refills | Status: DC
  Filled 2021-07-29: qty 180, 90d supply, fill #0

## 2021-07-29 NOTE — Progress Notes (Signed)
Chief Complaint:   OBESITY Winferd is here to discuss his progress with his obesity treatment plan along with follow-up of his obesity related diagnoses. Nyron is on keeping a food journal and adhering to recommended goals of 2200-2400 calories and 140+ grams of protein daily and states he is following his eating plan approximately 20% of the time. Amyr states he is doing 0 minutes 0 times per week.  Today's visit was #: 68 Starting weight: 364 lbs Starting date: 10/19/2019 Today's weight: 338 lbs Today's date: 07/29/2021 Total lbs lost to date: 26 Total lbs lost since last in-office visit: 0  Interim History: Murtaza has felt burnt out lately and he feels he needs a break. He is not exercising like he feels he should and he is not journaling currently.  Subjective:   1. Vitamin D deficiency Wilberth is currently taking OTC vitamin D 5,000 units each day. He denies nausea, vomiting or muscle weakness.  2. Seasonal allergies Miner E Mccance has seasonal allergies, and his symptoms are well controlled currently. He is taking Singulair and Xyzal.   3. Pre-diabetes Ashur has a diagnosis of pre-diabetes based on his elevated HgA1c and was informed this puts him at greater risk of developing diabetes. He continues to work on diet and exercise to decrease his risk of diabetes. He denies nausea or hypoglycemia.  4. At risk for diabetes mellitus Stiles is at higher than average risk for developing diabetes due to pre-diabetes and obesity.   Assessment/Plan:  No orders of the defined types were placed in this encounter.   Medications Discontinued During This Encounter  Medication Reason   metFORMIN (GLUCOPHAGE XR) 500 MG 24 hr tablet Reorder     Meds ordered this encounter  Medications   metFORMIN (GLUCOPHAGE XR) 500 MG 24 hr tablet    Sig: Take 2 tablets (1,000 mg total) by mouth daily with breakfast. **PER MD OFFICE VISIT REQUIRED FOR REFILL 07/29/21**    Dispense:  180 tablet     Refill:  0    ** OV for RF **   Do not send RF request     1. Vitamin D deficiency Low Vitamin D level contributes to fatigue and are associated with obesity, breast, and colon cancer. Monique will continue OTC Vitamin D 5,000 IU daily and he declines labs today. He will follow-up for routine testing of Vitamin D, at least 2-3 times per year to avoid over-replacement.  2. Seasonal allergies Shay will continue his medications, and he declines a need for a refill today.  3. Pre-diabetes Shanti agreed to increase metformin XR to 1,000 mg q AM, and he requests a 90 day supply with no refills. He declines labs again today and wishes to do them in January. He will continue to work on weight loss, exercise, and decreasing simple carbohydrates to help decrease the risk of diabetes.   - metFORMIN (GLUCOPHAGE XR) 500 MG 24 hr tablet; Take 2 tablets (1,000 mg total) by mouth daily with breakfast. **PER MD OFFICE VISIT REQUIRED FOR REFILL 07/29/21**  Dispense: 180 tablet; Refill: 0  4. At risk for diabetes mellitus Nashton was given approximately 9 minutes of diabetes education and counseling today. We discussed intensive lifestyle modifications today with an emphasis on weight loss as well as increasing exercise and decreasing simple carbohydrates in his diet. We also reviewed medication options with an emphasis on risk versus benefit of those discussed.   Repetitive spaced learning was employed today to elicit superior memory formation  and behavioral change.  5. Obesity with current BMI of 41.2 Eugen is currently in the action stage of change. As such, his goal is to continue with weight loss efforts. He has agreed to change to practicing portion control and making smarter food choices, such as increasing vegetables and decreasing simple carbohydrates.   Markus wishes to change plans to loosen/ less regimented one, and he declines close follow-ups.  Exercise goals: All adults should avoid  inactivity. Some physical activity is better than none, and adults who participate in any amount of physical activity gain some health benefits.  Behavioral modification strategies: keeping healthy foods in the home.  Carlus has agreed to follow-up with our clinic in 8 weeks per patient's request. He was informed of the importance of frequent follow-up visits to maximize his success with intensive lifestyle modifications for his multiple health conditions.   Objective:   Blood pressure 109/70, pulse 68, temperature (!) 97 F (36.1 C), height 6\' 4"  (1.93 m), weight (!) 338 lb (153.3 kg), SpO2 98 %. Body mass index is 41.14 kg/m.  General: Cooperative, alert, well developed, in no acute distress. HEENT: Conjunctivae and lids unremarkable. Cardiovascular: Regular rhythm.  Lungs: Normal work of breathing. Neurologic: No focal deficits.   Lab Results  Component Value Date   CREATININE 0.96 08/01/2020   BUN 16 08/01/2020   NA 141 08/01/2020   K 4.4 08/01/2020   CL 103 08/01/2020   CO2 25 08/01/2020   Lab Results  Component Value Date   ALT 22 08/01/2020   AST 17 08/01/2020   ALKPHOS 73 08/01/2020   BILITOT 0.6 08/01/2020   Lab Results  Component Value Date   HGBA1C 5.4 02/06/2021   HGBA1C 5.6 08/01/2020   HGBA1C 5.3 02/14/2020   HGBA1C 5.8 (H) 10/19/2019   HGBA1C 5.5 06/07/2018   Lab Results  Component Value Date   INSULIN 15.5 02/06/2021   INSULIN 31.0 (H) 08/01/2020   INSULIN 14.8 02/14/2020   INSULIN 41.0 (H) 10/19/2019   Lab Results  Component Value Date   TSH 2.130 08/01/2020   Lab Results  Component Value Date   CHOL 131 08/01/2020   HDL 52 08/01/2020   LDLCALC 63 08/01/2020   TRIG 83 08/01/2020   CHOLHDL 3.6 06/07/2018   Lab Results  Component Value Date   VD25OH 56.0 02/06/2021   VD25OH 56.1 08/01/2020   VD25OH 72.6 02/14/2020   Lab Results  Component Value Date   WBC 6.1 06/07/2018   HGB 14.2 06/07/2018   HCT 42.4 06/07/2018   MCV 90  06/07/2018   PLT 311 06/07/2018   No results found for: IRON, TIBC, FERRITIN  Attestation Statements:   Reviewed by clinician on day of visit: allergies, medications, problem list, medical history, surgical history, family history, social history, and previous encounter notes.   I, 06/09/2018, am acting as transcriptionist for Burt Knack, MD.  I have reviewed the above documentation for accuracy and completeness, and I agree with the above. Quillian Quince, D.O.  The 21st Century Cures Act was signed into law in 2016 which includes the topic of electronic health records.  This provides immediate access to information in MyChart.  This includes consultation notes, operative notes, office notes, lab results and pathology reports.  If you have any questions about what you read please let 2017 know at your next visit so we can discuss your concerns and take corrective action if need be.  We are right here with you.

## 2021-07-30 ENCOUNTER — Ambulatory Visit: Payer: 59 | Admitting: Physician Assistant

## 2021-07-31 ENCOUNTER — Encounter: Payer: Self-pay | Admitting: Physician Assistant

## 2021-07-31 ENCOUNTER — Other Ambulatory Visit (HOSPITAL_COMMUNITY): Payer: Self-pay

## 2021-07-31 ENCOUNTER — Ambulatory Visit (INDEPENDENT_AMBULATORY_CARE_PROVIDER_SITE_OTHER): Payer: 59 | Admitting: Physician Assistant

## 2021-07-31 VITALS — Ht 76.0 in | Wt 338.0 lb

## 2021-07-31 DIAGNOSIS — I10 Essential (primary) hypertension: Secondary | ICD-10-CM | POA: Diagnosis not present

## 2021-07-31 DIAGNOSIS — N521 Erectile dysfunction due to diseases classified elsewhere: Secondary | ICD-10-CM

## 2021-07-31 DIAGNOSIS — E349 Endocrine disorder, unspecified: Secondary | ICD-10-CM | POA: Diagnosis not present

## 2021-07-31 DIAGNOSIS — J3089 Other allergic rhinitis: Secondary | ICD-10-CM | POA: Diagnosis not present

## 2021-07-31 DIAGNOSIS — F3289 Other specified depressive episodes: Secondary | ICD-10-CM

## 2021-07-31 MED ORDER — TADALAFIL 20 MG PO TABS
ORAL_TABLET | ORAL | 1 refills | Status: DC
  Filled 2021-07-31: qty 6, fill #0
  Filled 2022-02-12: qty 6, 30d supply, fill #0
  Filled 2022-04-23: qty 6, 30d supply, fill #1

## 2021-07-31 MED ORDER — BUPROPION HCL ER (XL) 150 MG PO TB24
ORAL_TABLET | Freq: Every day | ORAL | 1 refills | Status: DC
  Filled 2021-07-31: qty 180, 90d supply, fill #0
  Filled 2022-02-10: qty 180, 90d supply, fill #1

## 2021-07-31 MED ORDER — FLUTICASONE PROPIONATE 50 MCG/ACT NA SUSP
2.0000 | Freq: Every day | NASAL | 1 refills | Status: DC
Start: 2021-07-31 — End: 2022-08-07
  Filled 2021-07-31: qty 16, 30d supply, fill #0

## 2021-07-31 MED ORDER — HYDROCHLOROTHIAZIDE 12.5 MG PO CAPS
ORAL_CAPSULE | Freq: Every day | ORAL | 1 refills | Status: DC
  Filled 2021-07-31: qty 90, fill #0
  Filled 2021-09-24: qty 90, 90d supply, fill #0
  Filled 2022-01-02: qty 90, 90d supply, fill #1

## 2021-07-31 MED ORDER — MONTELUKAST SODIUM 10 MG PO TABS
10.0000 mg | ORAL_TABLET | Freq: Every day | ORAL | 1 refills | Status: DC
  Filled 2021-07-31 – 2021-09-09 (×2): qty 90, 90d supply, fill #0
  Filled 2021-12-16: qty 90, 90d supply, fill #1

## 2021-07-31 MED ORDER — LOSARTAN POTASSIUM 100 MG PO TABS
100.0000 mg | ORAL_TABLET | Freq: Every day | ORAL | 1 refills | Status: DC
  Filled 2021-07-31 – 2021-08-07 (×2): qty 90, 90d supply, fill #0
  Filled 2021-11-12: qty 90, 90d supply, fill #1

## 2021-07-31 NOTE — Progress Notes (Signed)
Telehealth office visit note for Joseph Masker, PA-C- at Primary Care at [facility]   I connected with current patient today by telephone and verified that I am speaking with the correct person    Location of the patient: Home  Location of the provider: Office - This visit type was conducted due to national recommendations for restrictions regarding the COVID-19 Pandemic (e.g. social distancing) in an effort to limit this patient's exposure and mitigate transmission in our community.    - No physical exam could be performed with this format, beyond that communicated to [place] by the patient/ family members as noted.   - Additionally my office staff/ schedulers were to discuss with the patient that there may be a monetary charge related to this service, depending on their medical insurance.  My understanding is that patient understood and consented to proceed.     _________________________________________________________________________________   History of Present Illness: Patient calls in for medication refills. Patient has no acute concerns. Reports continues with weight loss program ([facility]). States blood pressure has been good, readings average 110/70. No chest pain, palpitations, shortness of breath or lower extremity swelling. Taking medications as directed without issues. Reports compliance with Wellbutrin, mood stable. Denies SI/HI.     No flowsheet data found.  Depression screen [facility] 2/9 07/31/2021 11/07/2020 08/06/2020 10/19/2019 09/26/2019  Decreased Interest 0 0 0 1 0  Down, Depressed, Hopeless 0 0 0 1 0  PHQ - 2 Score 0 0 0 2 0  Altered sleeping 0 0 0 3 0  Tired, decreased energy 1 0 0 1 0  Change in appetite 0 1 0 2 0  Feeling bad or failure about yourself  0 0 0 1 0  Trouble concentrating 0 0 0 2 0  Moving slowly or fidgety/restless 0 0 0 0 0  Suicidal thoughts 0 0 0 0 0  PHQ-9 Score 1 1 0 11 0  Difficult doing work/chores - - - Not difficult at  all -      Impression and Recommendations:     1. Essential hypertension   2. Other depression, with emotional eatimg   3. Erectile dysfunction due to diseases classified elsewhere   4. 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 Environmental and seasonal allergies     Essential hypertension: -Controlled. -Continue current medication regimen. -Patient reports will be having blood work done at his next visit with [facility], recommend obtaining CMP for medication monitoring. -Will continue to monitor.  Other depression, with emotional eating: -PHQ-9 score of 1, stable. -Continue current medication regimen. -Will continue to monitor.  ED, Testosterone insufficiency: -Stable. -Continue current medication regimen. -Will continue to monitor.  Environmental and seasonal allergies: -Stable. -Continue current medication regimen. -Will continue to monitor.   - As part of my medical decision making, I reviewed the following data within the electronic MEDICAL RECORD NUMBER History obtained from pt /family, CMA notes reviewed and incorporated if applicable, Labs reviewed, Radiograph/ tests reviewed if applicable and OV notes from prior OV's with me, as well as any other specialists she/he has seen since seeing me last, were all reviewed and used in my medical decision making process today.    - Additionally, when appropriate, discussion had with patient regarding our treatment plan, and their biases/concerns about that plan were used in my medical decision making today.    - The patient agreed with the plan and demonstrated an understanding of the instructions.  No barriers to understanding were identified.     - The patient was advised to call back or seek an in-person evaluation if the symptoms worsen or if the condition fails to improve as anticipated.   Return for CPE and FBW in 3-6 months.    No orders of the defined types were placed in this encounter.   Meds ordered  this encounter  Medications   buPROPion (WELLBUTRIN XL) 150 MG 24 hr tablet    Sig: TAKE 2 TABLETS BY MOUTH DAILY    Dispense:  180 tablet    Refill:  1    Order Specific Question:   Supervising Provider    Answer:   Nani Gasser D [[id number]]   hydrochlorothiazide (MICROZIDE) 12.5 MG capsule    Sig: TAKE 1 CAPSULE BY MOUTH ONCE A DAY    Dispense:  90 capsule    Refill:  1    Order Specific Question:   Supervising Provider    Answer:   Nani Gasser D [[id number]]   losartan (COZAAR) 100 MG tablet    Sig: Take 1 tablet (100 mg total) by mouth daily.    Dispense:  90 tablet    Refill:  1    Order Specific Question:   Supervising Provider    Answer:   Nani Gasser D [[id number]]   tadalafil (CIALIS) 20 MG tablet    Sig: TAKE 1/2 TO 1 TABLET BY MOUTH DAILY AS NEEDED FOR ERECTILE DYSFUNCTION.  TAKE 1 HOUR PRIOR TO NEED AS DIRECTED.    Dispense:  6 tablet    Refill:  1    Order Specific Question:   Supervising Provider    Answer:   Nani Gasser D [[id number]]   montelukast (SINGULAIR) 10 MG tablet    Sig: Take 1 tablet (10 mg total) by mouth at bedtime.    Dispense:  90 tablet    Refill:  1    Order Specific Question:   Supervising Provider    Answer:   Nani Gasser D [[id number]]   fluticasone (FLONASE) 50 MCG/ACT nasal spray    Sig: PLACE 2 SPRAYS INTO BOTH NOSTRILS DAILY.    Dispense:  16 g    Refill:  1    Order Specific Question:   Supervising Provider    Answer:   Nani Gasser D [[id number]]    Medications Discontinued During This Encounter  Medication Reason   fluticasone (FLONASE) 50 MCG/ACT nasal spray Reorder   buPROPion (WELLBUTRIN XL) 150 MG 24 hr tablet Reorder   tadalafil (CIALIS) 20 MG tablet Reorder   montelukast (SINGULAIR) 10 MG tablet Reorder   hydrochlorothiazide (MICROZIDE) 12.5 MG capsule Reorder   losartan (COZAAR) 100 MG tablet Reorder       Time spent on telephone encounter was 5 minutes.   Note:  This note was prepared with assistance  of Dragon voice recognition software. Occasional wrong-word or sound-a-like substitutions may have occurred due to the inherent limitations of voice recognition software.    The 21st Century Cures Act was signed into law in 2016 which includes the topic of electronic health records.  This provides immediate access to information in MyChart.  This includes consultation notes, operative notes, office notes, lab results and pathology reports.  If you have any questions about what you read please let us know at your next visit or call us at the office.  We are right here with you.   __________________________________________________________________________________     Patient Care Team    Relationship Specialty  Notifications Start End  Joseph Masker, PA-C PCP - General   01/22/20      -Vitals obtained; medications/ allergies reconciled;  personal medical, social, Sx etc.histories were updated by CMA, reviewed by me and are reflected in chart   Patient Active Problem List   Diagnosis Date Noted   At risk for side effect of medication 01/16/2021   At risk for malnutrition 11/19/2020   At risk for activity intolerance 10/29/2020   Seasonal allergies 09/11/2020   At risk for diabetes mellitus 08/22/2020   Essential hypertension 08/01/2020   Low testosterone 08/01/2020   Adjustment disorder with mixed anxiety and depressed mood 08/01/2020   Nonalcoholic hepatosteatosis 08/01/2020   Low testosterone in male 08/01/2020   At risk for impaired metabolic function 08/01/2020   Insulin resistance 03/06/2020   B12 deficiency 02/14/2020   Prediabetes 10/20/2019   Low HDL (under 40) 10/20/2019   Elevated ALT measurement 10/20/2019   Erectile dysfunction due to diseases classified elsewhere 09/26/2019   Testosterone insufficiency 09/26/2019   History of non anemic vitamin B12 deficiency 06/07/2018   Depression 03/02/2018   Hypertension 04/27/2017   GERD (gastroesophageal reflux disease)  04/27/2017   Environmental and seasonal allergies 04/27/2017   Vitamin D deficiency 04/27/2017   Adjustment disorder with depressed mood in remission 04/27/2017   Class 2 severe obesity with serious comorbidity and body mass index (BMI) of 37.0 to 37.9 in adult (HCC) 04/27/2017   Numbness and tingling of both feet 04/27/2017   OSA on CPAP 04/27/2017   Family history of suicide- father age 79 04/27/2017   Family history of alcoholism in grandfathers 04/27/2017   Family history of drug addiction 04/27/2017   Family history of depression 04/27/2017   Family history of diet-controlled diabetes- father in 70s 04/27/2017   Family history of non-Hodgkin's lymphoma- father 04/27/2017   Erectile dysfunction 04/19/2015     No outpatient medications have been marked as taking for the 07/31/21 encounter (Office Visit) with Joseph Masker, PA-C.     Allergies:  Allergies  Allergen Reactions   Amoxicillin-Pot Clavulanate Rash   Penicillins Hives and Other (See Comments)    Has patient had a PCN reaction causing immediate rash, facial/tongue/throat swelling, SOB or lightheadedness with hypotension:  Has patient had a PCN reaction causing severe rash involving mucus membranes or skin necrosis:  Has patient had a PCN reaction that required hospitalization:  Has patient had a PCN reaction occurring within the last 10 years:  If all of the above answers are "NO", then may proceed with Cephalosporin use.    Semaglutide Diarrhea and Nausea And Vomiting     ROS:  See above HPI for pertinent positives and negatives   Objective:   Height 6\' 4"  (1.93 m), weight (!) 338 lb (153.3 kg).  (if some vitals are omitted, this means that patient was UNABLE to obtain them.) General: A & O * 3; sounds in no acute distress Respiratory: speaking in full sentences, no conversational dyspnea Psych: insight appears good, mood- appears full

## 2021-08-01 ENCOUNTER — Other Ambulatory Visit (HOSPITAL_COMMUNITY): Payer: Self-pay

## 2021-08-07 ENCOUNTER — Other Ambulatory Visit (HOSPITAL_COMMUNITY): Payer: Self-pay

## 2021-09-09 ENCOUNTER — Other Ambulatory Visit (HOSPITAL_COMMUNITY): Payer: Self-pay

## 2021-09-20 ENCOUNTER — Encounter (INDEPENDENT_AMBULATORY_CARE_PROVIDER_SITE_OTHER): Payer: Self-pay | Admitting: Family Medicine

## 2021-09-24 ENCOUNTER — Other Ambulatory Visit (HOSPITAL_COMMUNITY): Payer: Self-pay

## 2021-09-24 ENCOUNTER — Ambulatory Visit (INDEPENDENT_AMBULATORY_CARE_PROVIDER_SITE_OTHER): Payer: 59 | Admitting: Family Medicine

## 2021-10-28 ENCOUNTER — Other Ambulatory Visit (INDEPENDENT_AMBULATORY_CARE_PROVIDER_SITE_OTHER): Payer: Self-pay | Admitting: Family Medicine

## 2021-10-28 ENCOUNTER — Other Ambulatory Visit (HOSPITAL_COMMUNITY): Payer: Self-pay

## 2021-10-28 DIAGNOSIS — R7303 Prediabetes: Secondary | ICD-10-CM

## 2021-11-12 ENCOUNTER — Other Ambulatory Visit (HOSPITAL_COMMUNITY): Payer: Self-pay

## 2021-12-16 ENCOUNTER — Other Ambulatory Visit (HOSPITAL_COMMUNITY): Payer: Self-pay

## 2022-01-02 ENCOUNTER — Other Ambulatory Visit (HOSPITAL_COMMUNITY): Payer: Self-pay

## 2022-01-02 ENCOUNTER — Telehealth: Payer: 59 | Admitting: Family Medicine

## 2022-01-02 DIAGNOSIS — H10022 Other mucopurulent conjunctivitis, left eye: Secondary | ICD-10-CM | POA: Diagnosis not present

## 2022-01-02 MED ORDER — POLYMYXIN B-TRIMETHOPRIM 10000-0.1 UNIT/ML-% OP SOLN
1.0000 [drp] | Freq: Four times a day (QID) | OPHTHALMIC | 0 refills | Status: DC
  Filled 2022-01-02: qty 10, 25d supply, fill #0

## 2022-01-02 NOTE — Progress Notes (Signed)

## 2022-02-10 ENCOUNTER — Other Ambulatory Visit: Payer: Self-pay | Admitting: Physician Assistant

## 2022-02-10 ENCOUNTER — Other Ambulatory Visit (HOSPITAL_COMMUNITY): Payer: Self-pay

## 2022-02-10 DIAGNOSIS — I10 Essential (primary) hypertension: Secondary | ICD-10-CM

## 2022-02-10 MED ORDER — LOSARTAN POTASSIUM 100 MG PO TABS
100.0000 mg | ORAL_TABLET | Freq: Every day | ORAL | 1 refills | Status: DC
  Filled 2022-02-10: qty 90, 90d supply, fill #0

## 2022-02-12 ENCOUNTER — Other Ambulatory Visit (HOSPITAL_COMMUNITY): Payer: Self-pay

## 2022-03-05 DIAGNOSIS — H5213 Myopia, bilateral: Secondary | ICD-10-CM | POA: Diagnosis not present

## 2022-03-05 DIAGNOSIS — H524 Presbyopia: Secondary | ICD-10-CM | POA: Diagnosis not present

## 2022-03-23 ENCOUNTER — Other Ambulatory Visit: Payer: Self-pay | Admitting: Physician Assistant

## 2022-03-23 DIAGNOSIS — J3089 Other allergic rhinitis: Secondary | ICD-10-CM

## 2022-03-24 ENCOUNTER — Other Ambulatory Visit (HOSPITAL_COMMUNITY): Payer: Self-pay

## 2022-03-24 MED ORDER — MONTELUKAST SODIUM 10 MG PO TABS
10.0000 mg | ORAL_TABLET | Freq: Every day | ORAL | 1 refills | Status: DC
  Filled 2022-03-24: qty 90, 90d supply, fill #0

## 2022-04-07 ENCOUNTER — Other Ambulatory Visit: Payer: Self-pay | Admitting: Physician Assistant

## 2022-04-07 DIAGNOSIS — I10 Essential (primary) hypertension: Secondary | ICD-10-CM

## 2022-04-08 ENCOUNTER — Other Ambulatory Visit (HOSPITAL_COMMUNITY): Payer: Self-pay

## 2022-04-08 MED ORDER — HYDROCHLOROTHIAZIDE 12.5 MG PO CAPS
ORAL_CAPSULE | Freq: Every day | ORAL | 1 refills | Status: DC
  Filled 2022-04-08: qty 90, 90d supply, fill #0
  Filled 2022-07-21: qty 90, 90d supply, fill #1

## 2022-04-21 ENCOUNTER — Ambulatory Visit (INDEPENDENT_AMBULATORY_CARE_PROVIDER_SITE_OTHER): Payer: 59 | Admitting: Physician Assistant

## 2022-04-21 ENCOUNTER — Encounter: Payer: Self-pay | Admitting: Physician Assistant

## 2022-04-21 ENCOUNTER — Other Ambulatory Visit (HOSPITAL_COMMUNITY): Payer: Self-pay

## 2022-04-21 VITALS — BP 122/78 | HR 79 | Temp 97.7°F | Ht 76.0 in | Wt 360.0 lb

## 2022-04-21 DIAGNOSIS — I10 Essential (primary) hypertension: Secondary | ICD-10-CM

## 2022-04-21 DIAGNOSIS — Z0000 Encounter for general adult medical examination without abnormal findings: Secondary | ICD-10-CM | POA: Diagnosis not present

## 2022-04-21 DIAGNOSIS — F3289 Other specified depressive episodes: Secondary | ICD-10-CM | POA: Diagnosis not present

## 2022-04-21 DIAGNOSIS — J3089 Other allergic rhinitis: Secondary | ICD-10-CM

## 2022-04-21 MED ORDER — MONTELUKAST SODIUM 10 MG PO TABS
10.0000 mg | ORAL_TABLET | Freq: Every day | ORAL | 1 refills | Status: DC
  Filled 2022-04-21 – 2022-07-03 (×2): qty 90, 90d supply, fill #0
  Filled 2022-10-17: qty 90, 90d supply, fill #1

## 2022-04-21 MED ORDER — BUPROPION HCL ER (XL) 150 MG PO TB24
ORAL_TABLET | Freq: Every day | ORAL | 1 refills | Status: DC
  Filled 2022-04-21: qty 180, 90d supply, fill #0
  Filled 2022-08-07 – 2022-11-05 (×3): qty 180, 90d supply, fill #1

## 2022-04-21 MED ORDER — LOSARTAN POTASSIUM 100 MG PO TABS
100.0000 mg | ORAL_TABLET | Freq: Every day | ORAL | 1 refills | Status: DC
  Filled 2022-04-21: qty 90, 90d supply, fill #0
  Filled 2022-08-07 – 2022-09-02 (×2): qty 90, 90d supply, fill #1

## 2022-04-21 NOTE — Patient Instructions (Signed)

## 2022-04-21 NOTE — Progress Notes (Signed)
Complete physical exam   Patient: Joseph Graves   DOB: Mar 15, 1971   51 y.o. Male  MRN: [id number] Visit Date: 04/21/2022   Chief Complaint  Patient presents with   Annual Exam   Subjective    Joseph Graves is a 51 y.o. male who presents today for a complete physical exam.  He reports consuming a general diet. The patient does not participate in regular exercise at present. But plans to start walking again when the weather is not so hot. He generally feels fairly well. He does not have additional problems to discuss today.     Past Medical History:  Diagnosis Date   Allergy    Anxiety    Asthma    outgrew at 42-30 years of age.    Depression    Edema of both feet    GERD (gastroesophageal reflux disease)    Hypertension    Lactose intolerance    Numbness in feet    Sleep apnea    Past Surgical History:  Procedure Laterality Date   BICEPS TENDON REPAIR  11/2013   NASAL SINUS SURGERY     VASECTOMY     Social History   Socioeconomic History   Marital status: Married    Spouse name: Ann   Number of children: Not on file   Years of education: Not on file   Highest education level: Not on file  Occupational History   Occupation: Museum/gallery exhibitions officer  Tobacco Use   Smoking status: Former    Packs/day: 0.25    Years: 4.50    Total pack years: 1.13    Types: Cigarettes    Quit date: 1992    Years since quitting: 31.6   Smokeless tobacco: Never  Vaping Use   Vaping Use: Never used  Substance and Sexual Activity   Alcohol use: Yes    Alcohol/week: 1.0 standard drink of alcohol    Types: 1 Cans of beer per week   Drug use: No   Sexual activity: Yes  Other Topics Concern   Not on file  Social History Narrative   Not on file   Social Determinants of Health   Financial Resource Strain: Not on file  Food Insecurity: Not on file  Transportation Needs: Not on file  Physical Activity: Not on file  Stress: Not on file  Social Connections: Not on  file  Intimate Partner Violence: Not on file     Medications: Outpatient Medications Prior to Visit  Medication Sig Note   Cholecalciferol (VITAMIN D3) 125 MCG (5000 UT) CAPS 5000 IU Monday-Friday and 10,000 IU on Saturday and Sunday.    fluticasone (FLONASE) 50 MCG/ACT nasal spray PLACE 2 SPRAYS INTO BOTH NOSTRILS DAILY.    hydrochlorothiazide (MICROZIDE) 12.5 MG capsule TAKE 1 CAPSULE BY MOUTH ONCE A DAY    ibuprofen (ADVIL) 200 MG tablet Take 200 mg by mouth every 6 (six) hours as needed.    influenza vac split quadrivalent PF (FLUARIX) 0.5 ML injection Inject into the muscle.    levocetirizine (XYZAL) 5 MG tablet TAKE 1 TABLET BY MOUTH EVERY EVENING    Omega-3 Fatty Acids (FISH OIL) 1200 MG CAPS Take 1,200 mg by mouth daily.     OVER THE COUNTER MEDICATION Take 1 tablet by mouth daily. [employer] Mega Men Prostate and Virility Supplement    tadalafil (CIALIS) 20 MG tablet TAKE 1/2 TO 1 TABLET BY MOUTH DAILY AS NEEDED FOR ERECTILE DYSFUNCTION.  TAKE 1 HOUR PRIOR TO NEED AS  DIRECTED.    trimethoprim-polymyxin b (POLYTRIM) ophthalmic solution Place 1-2 drops into the left eye in the morning, at noon, in the evening, and at bedtime.    [DISCONTINUED] buPROPion (WELLBUTRIN XL) 150 MG 24 hr tablet TAKE 2 TABLETS BY MOUTH DAILY    [DISCONTINUED] losartan (COZAAR) 100 MG tablet Take 1 tablet by mouth daily.    [DISCONTINUED] montelukast (SINGULAIR) 10 MG tablet Take 1 tablet (10 mg total) by mouth at bedtime.    [DISCONTINUED] metFORMIN (GLUCOPHAGE XR) 500 MG 24 hr tablet Take 2 tablets (1,000 mg total) by mouth daily with breakfast. **PER MD OFFICE VISIT REQUIRED FOR REFILL 07/29/21** 04/21/2022: no longer taking   No facility-administered medications prior to visit.    Review of Systems Review of Systems:  A fourteen system review of systems was performed and found to be positive as per HPI.  Last CBC Lab Results  Component Value Date   WBC 6.1 06/07/2018   HGB 14.2 06/07/2018   HCT 42.4  06/07/2018   MCV 90 06/07/2018   MCH 30.2 06/07/2018   RDW 13.3 06/07/2018   PLT 311 06/07/2018   Last metabolic panel Lab Results  Component Value Date   GLUCOSE 83 08/01/2020   NA 141 08/01/2020   K 4.4 08/01/2020   CL 103 08/01/2020   CO2 25 08/01/2020   BUN 16 08/01/2020   CREATININE 0.96 08/01/2020   GFRNONAA 92 08/01/2020   CALCIUM 9.8 08/01/2020   PROT 7.2 08/01/2020   ALBUMIN 4.3 08/01/2020   LABGLOB 2.9 08/01/2020   AGRATIO 1.5 08/01/2020   BILITOT 0.6 08/01/2020   ALKPHOS 73 08/01/2020   AST 17 08/01/2020   ALT 22 08/01/2020   Last lipids Lab Results  Component Value Date   CHOL 131 08/01/2020   HDL 52 08/01/2020   LDLCALC 63 08/01/2020   TRIG 83 08/01/2020   CHOLHDL 3.6 06/07/2018   Last hemoglobin A1c Lab Results  Component Value Date   HGBA1C 5.4 02/06/2021   Last thyroid functions Lab Results  Component Value Date   TSH 2.130 08/01/2020   T3TOTAL 98 08/01/2020   Last vitamin D Lab Results  Component Value Date   VD25OH 56.0 02/06/2021      Objective     BP 122/78   Pulse 79   Temp 97.7 F (36.5 C)   Ht 6\' 4"  (1.93 m)   Wt (!) 360 lb (163.3 kg)   SpO2 99%   BMI 43.82 kg/m  BP Readings from Last 3 Encounters:  04/21/22 122/78  07/29/21 109/70  06/24/21 110/73   Wt Readings from Last 3 Encounters:  04/21/22 (!) 360 lb (163.3 kg)  07/31/21 (!) 338 lb (153.3 kg)  07/29/21 (!) 338 lb (153.3 kg)     Physical Exam   General Appearance:    Alert, cooperative, in no acute distress, appears stated age  Head:    Normocephalic, without obvious abnormality, atraumatic  Eyes:    PERRL, conjunctiva/corneas clear, EOM's intact, fundi    benign, both eyes       Ears:    Normal TM's and external ear canals, both ears  Nose:   Nares normal, septum midline, mucosa pale, no drainage   or sinus tenderness  Throat:   Lips, mucosa, and tongue normal; teeth and gums normal  Neck:   Supple, symmetrical, trachea midline, no adenopathy;        thyroid:  No enlargement/tenderness/nodules; no JVD  Back:     Symmetric, no curvature, ROM normal,  no CVA tenderness  Lungs:     Clear to auscultation bilaterally, respirations unlabored  Chest wall:    No tenderness or deformity  Heart:    Normal heart rate. Normal rhythm. No murmurs, rubs, or gallops.  S1 and S2 normal  Abdomen:     Soft, non-tender, bowel sounds active all four quadrants,    no masses, no organomegaly  Genitalia:    deferred  Rectal:    deferred  Extremities:   All extremities are intact. No cyanosis or edema  Pulses:   2+ and symmetric all extremities  Skin:   Skin color, texture, turgor normal, no rashes or lesions  Lymph nodes:   Cervical and supraclavicular nodes normal  Neurologic:   CNII-XII grossly intact.      Last depression screening scores    04/21/2022    3:00 PM 07/31/2021    1:09 PM 11/07/2020    4:11 PM  PHQ 2/9 Scores  PHQ - 2 Score 1 0 0  PHQ- 9 Score 3 1 1    Last fall risk screening    04/21/2022    2:59 PM  Fall Risk   Falls in the past year? 0  Number falls in past yr: 0  Injury with Fall? 0  Risk for fall due to : No Fall Risks  Follow up Falls evaluation completed     No results found for any visits on 04/21/22.  Assessment & Plan    Routine Health Maintenance and Physical Exam  Exercise Activities and Dietary recommendations -Discussed heart healthy diet low in fat and carbohydrates. Recommend moderate exercise 150 mins/wk.  Immunization History  Administered Date(s) Administered   Influenza Split 08/08/2005   Influenza Whole 06/08/2018   Influenza, Seasonal, Injecte, Preservative Fre 06/14/2015   Influenza-Unspecified 06/12/2019, 06/18/2020   PFIZER(Purple Top)SARS-COV-2 Vaccination 10/12/2019, 11/02/2019, 02/13/2020   Tdap 04/19/2015    Health Maintenance  Topic Date Due   COLONOSCOPY (Pts 45-72yrs Insurance coverage will need to be confirmed)  Never done   Zoster Vaccines- Shingrix (1 of 2) Never done   COVID-19  Vaccine (4 - Pfizer series) 04/09/2020   INFLUENZA VACCINE  04/22/2022   TETANUS/TDAP  04/18/2025   Hepatitis C Screening  Completed   HIV Screening  Completed   HPV VACCINES  Aged Out    Discussed health benefits of physical activity, and encouraged him to engage in regular exercise appropriate for his age and condition.  Problem List Items Addressed This Visit       Cardiovascular and Mediastinum   Essential hypertension   Relevant Medications   losartan (COZAAR) 100 MG tablet     Other   Environmental and seasonal allergies   Relevant Medications   montelukast (SINGULAIR) 10 MG tablet   Depression   Relevant Medications   buPROPion (WELLBUTRIN XL) 150 MG 24 hr tablet   Other Visit Diagnoses     Healthcare maintenance    -  Primary      UTD Cologuard.  Recommend to schedule lab visit for routine fasting labs. Deferred Shingrix. Continue current medication regimen. Provided medication refills.     Return in about 6 months (around 10/22/2022) for HTN, Mood; lab visit for FBW include Vit D in 1-6 weeks .       10/24/2022, PA-C  [facility] [phone] (phone) [phone] (fax)  [facility]

## 2022-04-23 ENCOUNTER — Other Ambulatory Visit (HOSPITAL_COMMUNITY): Payer: Self-pay

## 2022-04-30 ENCOUNTER — Encounter (INDEPENDENT_AMBULATORY_CARE_PROVIDER_SITE_OTHER): Payer: Self-pay

## 2022-06-07 ENCOUNTER — Telehealth: Payer: 59 | Admitting: Physician Assistant

## 2022-06-07 DIAGNOSIS — J019 Acute sinusitis, unspecified: Secondary | ICD-10-CM

## 2022-06-07 DIAGNOSIS — B9689 Other specified bacterial agents as the cause of diseases classified elsewhere: Secondary | ICD-10-CM

## 2022-06-07 MED ORDER — DOXYCYCLINE HYCLATE 100 MG PO TABS: 100.0000 mg | ORAL_TABLET | Freq: Two times a day (BID) | ORAL | 0 refills | Status: DC

## 2022-06-07 NOTE — Progress Notes (Signed)

## 2022-07-03 ENCOUNTER — Other Ambulatory Visit (HOSPITAL_COMMUNITY): Payer: Self-pay

## 2022-07-21 ENCOUNTER — Other Ambulatory Visit (HOSPITAL_COMMUNITY): Payer: Self-pay

## 2022-07-24 ENCOUNTER — Ambulatory Visit: Payer: 59

## 2022-07-24 ENCOUNTER — Other Ambulatory Visit (HOSPITAL_COMMUNITY): Payer: Self-pay

## 2022-07-24 ENCOUNTER — Telehealth: Payer: 59 | Admitting: Physician Assistant

## 2022-07-24 DIAGNOSIS — B9789 Other viral agents as the cause of diseases classified elsewhere: Secondary | ICD-10-CM | POA: Diagnosis not present

## 2022-07-24 DIAGNOSIS — J019 Acute sinusitis, unspecified: Secondary | ICD-10-CM | POA: Diagnosis not present

## 2022-07-24 MED ORDER — PREDNISONE 20 MG PO TABS
40.0000 mg | ORAL_TABLET | Freq: Every day | ORAL | 0 refills | Status: DC
  Filled 2022-07-24: qty 10, 5d supply, fill #0

## 2022-07-24 NOTE — Progress Notes (Signed)
I have spent 5 minutes in review of e-visit questionnaire, review and updating patient chart, medical decision making and response to patient.   Aran Menning Cody Basel Defalco, PA-C    

## 2022-07-24 NOTE — Progress Notes (Signed)
E-Visit for Sinus Problems  We are sorry that you are not feeling well.  Here is how we plan to help!  Based on what you have shared with me it looks like you have sinusitis.  Sinusitis is inflammation and infection in the sinus cavities of the head.  Based on your presentation I believe you most likely have Acute Viral Sinusitis.This is an infection most likely caused by a virus. There is not specific treatment for viral sinusitis other than to help you with the symptoms until the infection runs its course.  You may use an oral decongestant such as Mucinex D or if you have glaucoma or high blood pressure use plain Mucinex. Saline nasal spray help and can safely be used as often as needed for congestion. Continue your Flonase nasal spray. I have prescribed: prednisone to take daily as directed for 5 days.   Some authorities believe that zinc sprays or the use of Echinacea may shorten the course of your symptoms.  Sinus infections are not as easily transmitted as other respiratory infection, however we still recommend that you avoid close contact with loved ones, especially the very young and elderly.  Remember to wash your hands thoroughly throughout the day as this is the number one way to prevent the spread of infection!  Home Care: Only take medications as instructed by your medical team. Do not take these medications with alcohol. A steam or ultrasonic humidifier can help congestion.  You can place a towel over your head and breathe in the steam from hot water coming from a faucet. Avoid close contacts especially the very young and the elderly. Cover your mouth when you cough or sneeze. Always remember to wash your hands.  Get Help Right Away If: You develop worsening fever or sinus pain. You develop a severe head ache or visual changes. Your symptoms persist after you have completed your treatment plan.  Make sure you Understand these instructions. Will watch your condition. Will get  help right away if you are not doing well or get worse.   Thank you for choosing an e-visit.  Your e-visit answers were reviewed by a board certified advanced clinical practitioner to complete your personal care plan. Depending upon the condition, your plan could have included both over the counter or prescription medications.  Please review your pharmacy choice. Make sure the pharmacy is open so you can pick up prescription now. If there is a problem, you may contact your provider through [employer] and have the prescription routed to another pharmacy.  Your safety is important to [place]. If you have drug allergies check your prescription carefully.   For the next 24 hours you can use MyChart to ask questions about today's visit, request a non-urgent call back, or ask for a work or school excuse. You will get an email in the next two days asking about your experience. I hope that your e-visit has been valuable and will speed your recovery.

## 2022-08-07 ENCOUNTER — Other Ambulatory Visit: Payer: Self-pay | Admitting: Physician Assistant

## 2022-08-07 ENCOUNTER — Other Ambulatory Visit (HOSPITAL_COMMUNITY): Payer: Self-pay

## 2022-08-07 DIAGNOSIS — J3089 Other allergic rhinitis: Secondary | ICD-10-CM

## 2022-08-07 DIAGNOSIS — N521 Erectile dysfunction due to diseases classified elsewhere: Secondary | ICD-10-CM

## 2022-08-07 DIAGNOSIS — E349 Endocrine disorder, unspecified: Secondary | ICD-10-CM

## 2022-08-07 MED ORDER — TADALAFIL 20 MG PO TABS
10.0000 mg | ORAL_TABLET | Freq: Every day | ORAL | 1 refills | Status: DC | PRN
  Filled 2022-08-07: qty 6, 6d supply, fill #0
  Filled 2022-08-18: qty 6, 30d supply, fill #0
  Filled 2022-11-03 – 2022-11-05 (×2): qty 6, 30d supply, fill #1

## 2022-08-07 MED ORDER — FLUTICASONE PROPIONATE 50 MCG/ACT NA SUSP
2.0000 | Freq: Every day | NASAL | 1 refills | Status: DC
  Filled 2022-08-07 – 2022-11-05 (×3): qty 16, 30d supply, fill #0

## 2022-08-08 ENCOUNTER — Other Ambulatory Visit (HOSPITAL_COMMUNITY): Payer: Self-pay

## 2022-08-16 ENCOUNTER — Other Ambulatory Visit (HOSPITAL_COMMUNITY): Payer: Self-pay

## 2022-08-18 ENCOUNTER — Other Ambulatory Visit (HOSPITAL_COMMUNITY): Payer: Self-pay

## 2022-09-02 ENCOUNTER — Other Ambulatory Visit (HOSPITAL_COMMUNITY): Payer: Self-pay

## 2022-10-07 ENCOUNTER — Other Ambulatory Visit (HOSPITAL_COMMUNITY): Payer: Self-pay

## 2022-10-13 DIAGNOSIS — M79672 Pain in left foot: Secondary | ICD-10-CM | POA: Diagnosis not present

## 2022-10-13 DIAGNOSIS — M79671 Pain in right foot: Secondary | ICD-10-CM | POA: Diagnosis not present

## 2022-10-13 DIAGNOSIS — M5451 Vertebrogenic low back pain: Secondary | ICD-10-CM | POA: Diagnosis not present

## 2022-10-13 DIAGNOSIS — M9903 Segmental and somatic dysfunction of lumbar region: Secondary | ICD-10-CM | POA: Diagnosis not present

## 2022-10-15 DIAGNOSIS — M79672 Pain in left foot: Secondary | ICD-10-CM | POA: Diagnosis not present

## 2022-10-15 DIAGNOSIS — M79671 Pain in right foot: Secondary | ICD-10-CM | POA: Diagnosis not present

## 2022-10-15 DIAGNOSIS — M9903 Segmental and somatic dysfunction of lumbar region: Secondary | ICD-10-CM | POA: Diagnosis not present

## 2022-10-15 DIAGNOSIS — M5451 Vertebrogenic low back pain: Secondary | ICD-10-CM | POA: Diagnosis not present

## 2022-10-17 ENCOUNTER — Other Ambulatory Visit: Payer: Self-pay

## 2022-10-17 ENCOUNTER — Other Ambulatory Visit: Payer: Self-pay | Admitting: Physician Assistant

## 2022-10-17 ENCOUNTER — Other Ambulatory Visit (HOSPITAL_COMMUNITY): Payer: Self-pay

## 2022-10-17 DIAGNOSIS — I10 Essential (primary) hypertension: Secondary | ICD-10-CM

## 2022-10-17 DIAGNOSIS — M79672 Pain in left foot: Secondary | ICD-10-CM | POA: Diagnosis not present

## 2022-10-17 DIAGNOSIS — M9903 Segmental and somatic dysfunction of lumbar region: Secondary | ICD-10-CM | POA: Diagnosis not present

## 2022-10-17 DIAGNOSIS — M5451 Vertebrogenic low back pain: Secondary | ICD-10-CM | POA: Diagnosis not present

## 2022-10-17 DIAGNOSIS — M79671 Pain in right foot: Secondary | ICD-10-CM | POA: Diagnosis not present

## 2022-10-17 MED ORDER — HYDROCHLOROTHIAZIDE 12.5 MG PO CAPS
12.5000 mg | ORAL_CAPSULE | Freq: Every day | ORAL | 0 refills | Status: DC
  Filled 2022-10-17: qty 90, 90d supply, fill #0

## 2022-10-17 NOTE — Telephone Encounter (Signed)
L.O.V: 04/21/22  N.O.V: 11/25/22  L.R.F: 04/08/22 HCTZ 90 cap 1 refill   Refill sent for 90 day. Office visit required for refills.

## 2022-10-20 DIAGNOSIS — M79672 Pain in left foot: Secondary | ICD-10-CM | POA: Diagnosis not present

## 2022-10-20 DIAGNOSIS — M5451 Vertebrogenic low back pain: Secondary | ICD-10-CM | POA: Diagnosis not present

## 2022-10-20 DIAGNOSIS — M79671 Pain in right foot: Secondary | ICD-10-CM | POA: Diagnosis not present

## 2022-10-20 DIAGNOSIS — M9903 Segmental and somatic dysfunction of lumbar region: Secondary | ICD-10-CM | POA: Diagnosis not present

## 2022-10-21 ENCOUNTER — Other Ambulatory Visit (HOSPITAL_COMMUNITY): Payer: Self-pay

## 2022-10-21 ENCOUNTER — Ambulatory Visit: Payer: 59 | Admitting: Physician Assistant

## 2022-10-22 ENCOUNTER — Other Ambulatory Visit (HOSPITAL_COMMUNITY): Payer: Self-pay

## 2022-10-22 DIAGNOSIS — M9903 Segmental and somatic dysfunction of lumbar region: Secondary | ICD-10-CM | POA: Diagnosis not present

## 2022-10-22 DIAGNOSIS — M79672 Pain in left foot: Secondary | ICD-10-CM | POA: Diagnosis not present

## 2022-10-22 DIAGNOSIS — M79671 Pain in right foot: Secondary | ICD-10-CM | POA: Diagnosis not present

## 2022-10-22 DIAGNOSIS — M5451 Vertebrogenic low back pain: Secondary | ICD-10-CM | POA: Diagnosis not present

## 2022-10-23 DIAGNOSIS — M5451 Vertebrogenic low back pain: Secondary | ICD-10-CM | POA: Diagnosis not present

## 2022-10-23 DIAGNOSIS — M79672 Pain in left foot: Secondary | ICD-10-CM | POA: Diagnosis not present

## 2022-10-23 DIAGNOSIS — M79671 Pain in right foot: Secondary | ICD-10-CM | POA: Diagnosis not present

## 2022-10-23 DIAGNOSIS — M9903 Segmental and somatic dysfunction of lumbar region: Secondary | ICD-10-CM | POA: Diagnosis not present

## 2022-11-03 ENCOUNTER — Other Ambulatory Visit (HOSPITAL_COMMUNITY): Payer: Self-pay

## 2022-11-03 ENCOUNTER — Other Ambulatory Visit: Payer: Self-pay

## 2022-11-05 ENCOUNTER — Other Ambulatory Visit: Payer: Self-pay

## 2022-11-24 NOTE — Progress Notes (Unsigned)
   Established Patient Office Visit  Subjective   Patient ID: Joseph Graves, male    DOB: 1971/01/29  Age: 52 y.o. MRN: [id number]  No chief complaint on file.   HPI Joseph Graves is a 52 y.o. male presenting today for follow up of ***  ROS Negative unless otherwise noted in HPI   Objective:     There were no vitals taken for this visit.  Physical Exam   No results found for any visits on 11/25/22.  {Labs (Optional):23779}  The 10-year ASCVD risk score (Arnett DK, et al., 2019) is: 2.8%    Assessment & Plan:  There are no diagnoses linked to this encounter.  No follow-ups on file.    Velva Harman, PA

## 2022-11-25 ENCOUNTER — Encounter: Payer: Self-pay | Admitting: Family Medicine

## 2022-11-25 ENCOUNTER — Ambulatory Visit (INDEPENDENT_AMBULATORY_CARE_PROVIDER_SITE_OTHER): Payer: 59 | Admitting: Family Medicine

## 2022-11-25 ENCOUNTER — Other Ambulatory Visit (HOSPITAL_COMMUNITY): Payer: Self-pay

## 2022-11-25 DIAGNOSIS — E349 Endocrine disorder, unspecified: Secondary | ICD-10-CM

## 2022-11-25 DIAGNOSIS — F3289 Other specified depressive episodes: Secondary | ICD-10-CM

## 2022-11-25 DIAGNOSIS — E786 Lipoprotein deficiency: Secondary | ICD-10-CM

## 2022-11-25 DIAGNOSIS — N521 Erectile dysfunction due to diseases classified elsewhere: Secondary | ICD-10-CM

## 2022-11-25 DIAGNOSIS — R200 Anesthesia of skin: Secondary | ICD-10-CM

## 2022-11-25 DIAGNOSIS — E559 Vitamin D deficiency, unspecified: Secondary | ICD-10-CM | POA: Diagnosis not present

## 2022-11-25 DIAGNOSIS — I10 Essential (primary) hypertension: Secondary | ICD-10-CM

## 2022-11-25 DIAGNOSIS — J302 Other seasonal allergic rhinitis: Secondary | ICD-10-CM | POA: Diagnosis not present

## 2022-11-25 DIAGNOSIS — R202 Paresthesia of skin: Secondary | ICD-10-CM

## 2022-11-25 DIAGNOSIS — F4323 Adjustment disorder with mixed anxiety and depressed mood: Secondary | ICD-10-CM | POA: Diagnosis not present

## 2022-11-25 DIAGNOSIS — J3089 Other allergic rhinitis: Secondary | ICD-10-CM

## 2022-11-25 DIAGNOSIS — R7303 Prediabetes: Secondary | ICD-10-CM

## 2022-11-25 MED ORDER — HYDROCHLOROTHIAZIDE 12.5 MG PO CAPS
12.5000 mg | ORAL_CAPSULE | Freq: Every day | ORAL | 0 refills | Status: DC
  Filled 2022-11-25 – 2023-01-28 (×2): qty 90, 90d supply, fill #0

## 2022-11-25 MED ORDER — IBUPROFEN 200 MG PO TABS
200.0000 mg | ORAL_TABLET | Freq: Four times a day (QID) | ORAL | 2 refills | Status: AC | PRN
  Filled 2022-11-25: qty 30, 8d supply, fill #0

## 2022-11-25 MED ORDER — TADALAFIL 20 MG PO TABS
10.0000 mg | ORAL_TABLET | Freq: Every day | ORAL | 1 refills | Status: DC | PRN
  Filled 2022-11-25: qty 6, 6d supply, fill #0
  Filled 2023-05-29: qty 6, 30d supply, fill #0
  Filled 2023-09-21: qty 6, 30d supply, fill #1

## 2022-11-25 MED ORDER — MONTELUKAST SODIUM 10 MG PO TABS
10.0000 mg | ORAL_TABLET | Freq: Every day | ORAL | 1 refills | Status: DC
  Filled 2022-11-25 – 2023-01-23 (×2): qty 90, 90d supply, fill #0
  Filled 2023-04-28: qty 90, 90d supply, fill #1

## 2022-11-25 MED ORDER — VITAMIN D3 125 MCG (5000 UT) PO CAPS: ORAL_CAPSULE | ORAL | 0 refills | Status: DC

## 2022-11-25 MED ORDER — FLUTICASONE PROPIONATE 50 MCG/ACT NA SUSP
2.0000 | Freq: Every day | NASAL | 1 refills | Status: DC
  Filled 2022-11-25: qty 16, 30d supply, fill #0

## 2022-11-25 MED ORDER — LOSARTAN POTASSIUM 100 MG PO TABS
100.0000 mg | ORAL_TABLET | Freq: Every day | ORAL | 1 refills | Status: DC
  Filled 2022-11-25: qty 90, 90d supply, fill #0
  Filled 2023-03-16: qty 90, 90d supply, fill #1

## 2022-11-25 MED ORDER — BUPROPION HCL ER (XL) 150 MG PO TB24
300.0000 mg | ORAL_TABLET | Freq: Every day | ORAL | 1 refills | Status: DC
  Filled 2022-11-25: qty 180, fill #0
  Filled 2023-03-16: qty 180, 90d supply, fill #0

## 2022-11-25 MED ORDER — LEVOCETIRIZINE DIHYDROCHLORIDE 5 MG PO TABS
ORAL_TABLET | Freq: Every evening | ORAL | 0 refills | Status: DC
  Filled 2022-11-25: qty 30, 30d supply, fill #0

## 2022-11-25 MED ORDER — FISH OIL 1200 MG PO CAPS
1200.0000 mg | ORAL_CAPSULE | Freq: Every day | ORAL | 0 refills | Status: AC
  Filled 2022-11-25: qty 90, 90d supply, fill #0

## 2022-11-25 NOTE — Assessment & Plan Note (Addendum)
Stable, continue Wellbutrin 150 mg twice daily.  Will continue to monitor.

## 2022-11-25 NOTE — Assessment & Plan Note (Signed)
Checking A1c with labs ordered today.  Encouraged to follow low-carb diet and continue prioritizing protein.

## 2022-11-25 NOTE — Assessment & Plan Note (Signed)
Patient has had symptoms for about 7 years now.  Was previously seen by neurology who conducted an EMG with no abnormalities.  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  Symptoms are still bothersome, discussed that a referral to neurology would be a reasonable neck step.  Patient declines referral as he feels that this would be ineffective.  We will start by seeing what lab work shows and if any abnormalities are present that might be contributing to symptoms.  Follow-up in 3 months to discuss options and see if visits to the chiropractor continue to relieve symptoms.

## 2022-11-25 NOTE — Assessment & Plan Note (Addendum)
Stable, blood pressure at goals running 115-120/70-80 at home.  Continue losartan 100 mg and hydrochlorothiazide 12.5 mg.  Encouraged to follow low-sodium diet.  Will collect blood work including CMP at lab visit in the next 1 to 2 weeks.

## 2022-11-25 NOTE — Assessment & Plan Note (Signed)
Checking vitamin D with labs.  Will adjust supplement as indicated by lab results.

## 2022-11-25 NOTE — Assessment & Plan Note (Signed)
Checking lipid panel with labs ordered today.  Refilled fish oil supplement.

## 2022-11-25 NOTE — Assessment & Plan Note (Addendum)
>>  ASSESSMENT AND PLAN FOR DEPRESSION WRITTEN ON 11/25/2022  8:24 AM BY Frederick Klinger A, PA  Stable.  Continue Wellbutrin 150 mg daily.  >>ASSESSMENT AND PLAN FOR ADJUSTMENT DISORDER WITH MIXED ANXIETY AND DEPRESSED MOOD WRITTEN ON 11/25/2022  8:22 AM BY Marjean Imperato A, PA  Stable, continue Wellbutrin 150 mg twice daily.  Will continue to monitor.

## 2022-12-02 ENCOUNTER — Other Ambulatory Visit: Payer: 59

## 2022-12-02 DIAGNOSIS — E559 Vitamin D deficiency, unspecified: Secondary | ICD-10-CM | POA: Diagnosis not present

## 2022-12-02 DIAGNOSIS — E786 Lipoprotein deficiency: Secondary | ICD-10-CM | POA: Diagnosis not present

## 2022-12-02 DIAGNOSIS — R7303 Prediabetes: Secondary | ICD-10-CM

## 2022-12-02 DIAGNOSIS — I10 Essential (primary) hypertension: Secondary | ICD-10-CM | POA: Diagnosis not present

## 2022-12-02 DIAGNOSIS — F4323 Adjustment disorder with mixed anxiety and depressed mood: Secondary | ICD-10-CM

## 2022-12-03 LAB — CBC WITH DIFFERENTIAL/PLATELET
Basophils Absolute: 0 10*3/uL (ref 0.0–0.2)
Basos: 1 %
EOS (ABSOLUTE): 0.1 10*3/uL (ref 0.0–0.4)
Eos: 2 %
Hematocrit: 43.8 % (ref 37.5–51.0)
Hemoglobin: 14.6 g/dL (ref 13.0–17.7)
Immature Grans (Abs): 0 10*3/uL (ref 0.0–0.1)
Immature Granulocytes: 1 %
Lymphocytes Absolute: 1.5 10*3/uL (ref 0.7–3.1)
Lymphs: 25 %
MCH: 30 pg (ref 26.6–33.0)
MCHC: 33.3 g/dL (ref 31.5–35.7)
MCV: 90 fL (ref 79–97)
Monocytes Absolute: 0.4 10*3/uL (ref 0.1–0.9)
Monocytes: 7 %
Neutrophils Absolute: 3.9 10*3/uL (ref 1.4–7.0)
Neutrophils: 64 %
Platelets: 287 10*3/uL (ref 150–450)
RBC: 4.86 x10E6/uL (ref 4.14–5.80)
RDW: 12.5 % (ref 11.6–15.4)
WBC: 6 10*3/uL (ref 3.4–10.8)

## 2022-12-03 LAB — LIPID PANEL
Chol/HDL Ratio: 3.3 ratio (ref 0.0–5.0)
Cholesterol, Total: 148 mg/dL (ref 100–199)
HDL: 45 mg/dL (ref >39)
LDL Chol Calc (NIH): 74 mg/dL (ref 0–99)
Triglycerides: 170 mg/dL — ABNORMAL HIGH (ref 0–149)
VLDL Cholesterol Cal: 29 mg/dL (ref 5–40)

## 2022-12-03 LAB — COMPREHENSIVE METABOLIC PANEL
ALT: 43 IU/L (ref 0–44)
AST: 26 IU/L (ref 0–40)
Albumin/Globulin Ratio: 1.7 (ref 1.2–2.2)
Albumin: 4.7 g/dL (ref 3.8–4.9)
Alkaline Phosphatase: 75 IU/L (ref 44–121)
BUN/Creatinine Ratio: 17 (ref 9–20)
BUN: 16 mg/dL (ref 6–24)
Bilirubin Total: 0.6 mg/dL (ref 0.0–1.2)
CO2: 21 mmol/L (ref 20–29)
Calcium: 9.7 mg/dL (ref 8.7–10.2)
Chloride: 104 mmol/L (ref 96–106)
Creatinine, Ser: 0.93 mg/dL (ref 0.76–1.27)
Globulin, Total: 2.7 g/dL (ref 1.5–4.5)
Glucose: 86 mg/dL (ref 70–99)
Potassium: 4.4 mmol/L (ref 3.5–5.2)
Sodium: 141 mmol/L (ref 134–144)
Total Protein: 7.4 g/dL (ref 6.0–8.5)
eGFR: 98 mL/min/{1.73_m2} (ref >59)

## 2022-12-03 LAB — HEMOGLOBIN A1C
Est. average glucose Bld gHb Est-mCnc: 117 mg/dL
Hgb A1c MFr Bld: 5.7 % — ABNORMAL HIGH (ref 4.8–5.6)

## 2022-12-03 LAB — VITAMIN D 25 HYDROXY (VIT D DEFICIENCY, FRACTURES): Vit D, 25-Hydroxy: 50.5 ng/mL (ref 30.0–100.0)

## 2022-12-09 ENCOUNTER — Other Ambulatory Visit (HOSPITAL_COMMUNITY): Payer: Self-pay

## 2022-12-31 ENCOUNTER — Telehealth: Payer: 59 | Admitting: Nurse Practitioner

## 2022-12-31 ENCOUNTER — Other Ambulatory Visit (HOSPITAL_COMMUNITY): Payer: Self-pay

## 2022-12-31 DIAGNOSIS — J069 Acute upper respiratory infection, unspecified: Secondary | ICD-10-CM

## 2022-12-31 MED ORDER — PREDNISONE 20 MG PO TABS
40.0000 mg | ORAL_TABLET | Freq: Every day | ORAL | 0 refills | Status: AC
  Filled 2022-12-31: qty 10, 5d supply, fill #0

## 2022-12-31 MED ORDER — FLUTICASONE PROPIONATE 50 MCG/ACT NA SUSP
2.0000 | Freq: Every day | NASAL | 6 refills | Status: AC
  Filled 2022-12-31: qty 16, 30d supply, fill #0

## 2022-12-31 MED ORDER — BENZONATATE 100 MG PO CAPS
100.0000 mg | ORAL_CAPSULE | Freq: Three times a day (TID) | ORAL | 0 refills | Status: DC | PRN
  Filled 2022-12-31: qty 30, 10d supply, fill #0

## 2022-12-31 NOTE — Addendum Note (Signed)
Addended by: Margaretann Loveless on: 12/31/2022 11:47 AM   Modules accepted: Orders

## 2022-12-31 NOTE — Progress Notes (Signed)

## 2023-01-23 ENCOUNTER — Other Ambulatory Visit (HOSPITAL_COMMUNITY): Payer: Self-pay

## 2023-01-28 ENCOUNTER — Other Ambulatory Visit (HOSPITAL_COMMUNITY): Payer: Self-pay

## 2023-02-25 ENCOUNTER — Other Ambulatory Visit (HOSPITAL_COMMUNITY): Payer: Self-pay

## 2023-02-25 ENCOUNTER — Encounter: Payer: Self-pay | Admitting: Family Medicine

## 2023-02-25 ENCOUNTER — Ambulatory Visit: Payer: 59 | Admitting: Family Medicine

## 2023-02-25 VITALS — BP 137/87 | HR 66 | Resp 18 | Ht 76.0 in | Wt 359.0 lb

## 2023-02-25 DIAGNOSIS — R7303 Prediabetes: Secondary | ICD-10-CM | POA: Diagnosis not present

## 2023-02-25 DIAGNOSIS — I10 Essential (primary) hypertension: Secondary | ICD-10-CM

## 2023-02-25 DIAGNOSIS — E786 Lipoprotein deficiency: Secondary | ICD-10-CM | POA: Diagnosis not present

## 2023-02-25 MED ORDER — HYDROCHLOROTHIAZIDE 12.5 MG PO CAPS
12.5000 mg | ORAL_CAPSULE | Freq: Every day | ORAL | 1 refills | Status: DC
  Filled 2023-02-25 – 2023-05-10 (×2): qty 90, 90d supply, fill #0

## 2023-02-25 NOTE — Patient Instructions (Signed)
Once you get the Shingles shots, you will be up to date on all of your preventative care!

## 2023-02-25 NOTE — Assessment & Plan Note (Signed)
Last lipid panel: LDL 74, HDL 45, triglycerides 170.  Rechecking lipid panel today.  Will continue to monitor.

## 2023-02-25 NOTE — Progress Notes (Signed)
   Established Patient Office Visit  Subjective   Patient ID: Joseph Graves, male    DOB: Jan 26, 1971  Age: 52 y.o. MRN: [id number]  Chief Complaint  Patient presents with   Hypertension    Hypertension   Joseph Graves is a 52 y.o. male presenting today for follow up of hypertension, prediabetes. Hypertension: Patient here for follow-up of elevated blood pressure.  Pt denies chest pain, SOB, dizziness, edema, syncope, fatigue or heart palpitations. Taking losartan and hydrochlorothiazide, reports excellent compliance with treatment. Denies side effects.  He does note that he only took his blood pressure medication a few minutes before coming into the office this morning.  At home, blood pressure has been running around 120-130/60-80 Prediabetes: denies hypoglycemic events, wounds or sores that are not healing well, increased thirst or urination.   ROS Negative unless otherwise noted in HPI   Objective:     BP 137/87 (BP Location: Left Arm, Patient Position: Sitting, Cuff Size: Large)   Pulse 66   Resp 18   Ht 6\' 4"  (1.93 m)   Wt (!) 359 lb (162.8 kg)   SpO2 95%   BMI 43.70 kg/m   Physical Exam Constitutional:      General: He is not in acute distress.    Appearance: Normal appearance.  HENT:     Head: Normocephalic and atraumatic.  Cardiovascular:     Rate and Rhythm: Normal rate and regular rhythm.     Pulses: Normal pulses.     Heart sounds: Normal heart sounds. No murmur heard.    No friction rub. No gallop.  Pulmonary:     Effort: Pulmonary effort is normal.     Breath sounds: Normal breath sounds. No wheezing, rhonchi or rales.  Skin:    General: Skin is warm and dry.  Neurological:     Mental Status: He is alert and oriented to person, place, and time.  Psychiatric:        Mood and Affect: Mood normal.     Assessment & Plan:  Essential hypertension Assessment & Plan: BP goal <140/90.  Stable, at goal.  Continue losartan 100 mg daily and  hydrochlorothiazide 12.5 mg daily.  Collecting CMP for medication monitoring today.  Encouraged to continue ambulatory blood pressure monitoring.  Will continue to monitor.  Orders: -     CBC with Differential/Platelet; Future -     Comprehensive metabolic panel; Future -     hydroCHLOROthiazide; Take 1 capsule (12.5 mg total) by mouth daily.  Dispense: 90 capsule; Refill: 1  Prediabetes Assessment & Plan: Last A1c 5.7.  Rechecking A1c today.  Orders: -     Hemoglobin A1c; Future  Low HDL (under 40) Assessment & Plan: Last lipid panel: LDL 74, HDL 45, triglycerides 170.  Rechecking lipid panel today.  Will continue to monitor.  Orders: -     Lipid panel; Future    Return in 3 months (on 05/29/2023) for annual physical, fasting blood work 1 week before.    Joseph Quitter, PA

## 2023-02-25 NOTE — Assessment & Plan Note (Signed)
Last A1c 5.7.  Rechecking A1c today.

## 2023-02-25 NOTE — Assessment & Plan Note (Signed)
BP goal <140/90.  Stable, at goal.  Continue losartan 100 mg daily and hydrochlorothiazide 12.5 mg daily.  Collecting CMP for medication monitoring today.  Encouraged to continue ambulatory blood pressure monitoring.  Will continue to monitor.

## 2023-02-26 LAB — LIPID PANEL
Chol/HDL Ratio: 3.7 ratio (ref 0.0–5.0)
Cholesterol, Total: 144 mg/dL (ref 100–199)
HDL: 39 mg/dL — ABNORMAL LOW (ref >39)
LDL Chol Calc (NIH): 79 mg/dL (ref 0–99)
Triglycerides: 151 mg/dL — ABNORMAL HIGH (ref 0–149)
VLDL Cholesterol Cal: 26 mg/dL (ref 5–40)

## 2023-02-26 LAB — COMPREHENSIVE METABOLIC PANEL
ALT: 37 IU/L (ref 0–44)
AST: 29 IU/L (ref 0–40)
Albumin/Globulin Ratio: 1.9 (ref 1.2–2.2)
Albumin: 4.4 g/dL (ref 3.8–4.9)
Alkaline Phosphatase: 68 IU/L (ref 44–121)
BUN/Creatinine Ratio: 19 (ref 9–20)
BUN: 18 mg/dL (ref 6–24)
Bilirubin Total: 0.4 mg/dL (ref 0.0–1.2)
CO2: 23 mmol/L (ref 20–29)
Calcium: 9.2 mg/dL (ref 8.7–10.2)
Chloride: 108 mmol/L — ABNORMAL HIGH (ref 96–106)
Creatinine, Ser: 0.96 mg/dL (ref 0.76–1.27)
Globulin, Total: 2.3 g/dL (ref 1.5–4.5)
Glucose: 106 mg/dL — ABNORMAL HIGH (ref 70–99)
Potassium: 4.5 mmol/L (ref 3.5–5.2)
Sodium: 146 mmol/L — ABNORMAL HIGH (ref 134–144)
Total Protein: 6.7 g/dL (ref 6.0–8.5)
eGFR: 95 mL/min/{1.73_m2} (ref >59)

## 2023-02-26 LAB — CBC WITH DIFFERENTIAL/PLATELET
Basophils Absolute: 0.1 10*3/uL (ref 0.0–0.2)
Basos: 1 %
EOS (ABSOLUTE): 0.1 10*3/uL (ref 0.0–0.4)
Eos: 2 %
Hematocrit: 42.6 % (ref 37.5–51.0)
Hemoglobin: 14.2 g/dL (ref 13.0–17.7)
Immature Grans (Abs): 0 10*3/uL (ref 0.0–0.1)
Immature Granulocytes: 0 %
Lymphocytes Absolute: 1.6 10*3/uL (ref 0.7–3.1)
Lymphs: 26 %
MCH: 30.9 pg (ref 26.6–33.0)
MCHC: 33.3 g/dL (ref 31.5–35.7)
MCV: 93 fL (ref 79–97)
Monocytes Absolute: 0.6 10*3/uL (ref 0.1–0.9)
Monocytes: 9 %
Neutrophils Absolute: 3.8 10*3/uL (ref 1.4–7.0)
Neutrophils: 62 %
Platelets: 272 10*3/uL (ref 150–450)
RBC: 4.59 x10E6/uL (ref 4.14–5.80)
RDW: 13.1 % (ref 11.6–15.4)
WBC: 6.1 10*3/uL (ref 3.4–10.8)

## 2023-02-26 LAB — HEMOGLOBIN A1C
Est. average glucose Bld gHb Est-mCnc: 123 mg/dL
Hgb A1c MFr Bld: 5.9 % — ABNORMAL HIGH (ref 4.8–5.6)

## 2023-03-17 ENCOUNTER — Other Ambulatory Visit: Payer: Self-pay

## 2023-03-17 ENCOUNTER — Other Ambulatory Visit (HOSPITAL_COMMUNITY): Payer: Self-pay

## 2023-03-18 ENCOUNTER — Other Ambulatory Visit (HOSPITAL_COMMUNITY): Payer: Self-pay

## 2023-03-23 DIAGNOSIS — L309 Dermatitis, unspecified: Secondary | ICD-10-CM | POA: Diagnosis not present

## 2023-04-03 ENCOUNTER — Telehealth: Payer: 59 | Admitting: Family Medicine

## 2023-04-03 ENCOUNTER — Other Ambulatory Visit (HOSPITAL_COMMUNITY): Payer: Self-pay

## 2023-04-03 DIAGNOSIS — J069 Acute upper respiratory infection, unspecified: Secondary | ICD-10-CM

## 2023-04-03 MED ORDER — PREDNISONE 20 MG PO TABS
20.0000 mg | ORAL_TABLET | Freq: Two times a day (BID) | ORAL | 0 refills | Status: AC
  Filled 2023-04-03: qty 10, 5d supply, fill #0

## 2023-04-03 NOTE — Progress Notes (Signed)

## 2023-04-29 ENCOUNTER — Other Ambulatory Visit (HOSPITAL_COMMUNITY): Payer: Self-pay

## 2023-05-11 ENCOUNTER — Other Ambulatory Visit (HOSPITAL_COMMUNITY): Payer: Self-pay

## 2023-05-15 ENCOUNTER — Other Ambulatory Visit (HOSPITAL_COMMUNITY): Payer: Self-pay

## 2023-05-19 ENCOUNTER — Other Ambulatory Visit: Payer: Self-pay | Admitting: Family Medicine

## 2023-05-19 DIAGNOSIS — E786 Lipoprotein deficiency: Secondary | ICD-10-CM

## 2023-05-19 DIAGNOSIS — R7303 Prediabetes: Secondary | ICD-10-CM

## 2023-05-19 DIAGNOSIS — I10 Essential (primary) hypertension: Secondary | ICD-10-CM

## 2023-05-22 ENCOUNTER — Other Ambulatory Visit: Payer: 59

## 2023-05-27 ENCOUNTER — Ambulatory Visit
Admission: EM | Admit: 2023-05-27 | Discharge: 2023-05-27 | Disposition: A | Discharge status: Home / Self Care | Payer: 59 | Attending: Internal Medicine | Admitting: Internal Medicine

## 2023-05-27 DIAGNOSIS — J309 Allergic rhinitis, unspecified: Secondary | ICD-10-CM

## 2023-05-27 MED ORDER — METHYLPREDNISOLONE ACETATE 80 MG/ML IJ SUSP
80.0000 mg | Freq: Once | INTRAMUSCULAR | Status: AC
  Administered 2023-05-27: 80 mg via INTRAMUSCULAR

## 2023-05-27 NOTE — Discharge Instructions (Addendum)
We have given you a steroid injection here in the clinic that will help you with your severe allergies.  Hold the Flonase nasal spray for 2 weeks.  Continue taking Xyzal, Singulair.  I would add in Claritin once daily.

## 2023-05-27 NOTE — ED Provider Notes (Signed)
[facility]  Note:  This document was prepared using Conservation officer, historic buildings and may include unintentional dictation errors.  MRN: [id number] DOB: 11/29/1970  Subjective:   Joseph Graves is a 52 y.o. male presenting for 2-day history of acute on chronic sinus congestion, sinus pressure, bilateral ear pain and sinus pain.  Patient has a history of severe allergies.  Takes Xyzal, Singulair, Flonase.  Has typically responded well with steroids.  He does believe that his allergies are severely flared up as he just came [place] from the beach and we have had a weather change which typically aggravates his allergies.  Does not use Afrin.  No history of heart failure.  No chest pain, shortness of breath or wheezing, hemoptysis.  No smoking.  No current facility-administered medications for this encounter.  Current Outpatient Medications:    buPROPion (WELLBUTRIN XL) 150 MG 24 hr tablet, Take 2 tablets (300 mg) by mouth daily., Disp: 180 tablet, Rfl: 1   Cholecalciferol (VITAMIN D3) 125 MCG (5000 UT) capsule, 5000 IU Monday-Friday and 10,000 IU on Saturday and Sunday., Disp: 30 capsule, Rfl: 0   fluticasone (FLONASE) 50 MCG/ACT nasal spray, Place 2 sprays into both nostrils daily., Disp: 16 g, Rfl: 6   hydrochlorothiazide (MICROZIDE) 12.5 MG capsule, Take 1 capsule (12.5 mg) by mouth daily., Disp: 90 capsule, Rfl: 1   ibuprofen (ADVIL) 200 MG tablet, Take 1 tablet (200 mg total) by mouth every 6 (six) hours as needed., Disp: 30 tablet, Rfl: 2   levocetirizine (XYZAL) 5 MG tablet, TAKE 1 TABLET BY MOUTH EVERY EVENING, Disp: 30 tablet, Rfl: 0   losartan (COZAAR) 100 MG tablet, Take 1 tablet by mouth daily., Disp: 90 tablet, Rfl: 1   montelukast (SINGULAIR) 10 MG tablet, Take 1 tablet (10 mg total) by mouth at bedtime., Disp: 90 tablet, Rfl: 1   tadalafil (CIALIS) 20 MG tablet, Take 0.5-1 tablets (10-20 mg total) by mouth daily as needed for erectile dysfunction. Take one  hour prior to need as directed., Disp: 6 tablet, Rfl: 1   Allergies  Allergen Reactions   Amoxicillin-Pot Clavulanate Rash   Penicillins Hives and Other (See Comments)    Has patient had a PCN reaction causing immediate rash, facial/tongue/throat swelling, SOB or lightheadedness with hypotension:  Has patient had a PCN reaction causing severe rash involving mucus membranes or skin necrosis:  Has patient had a PCN reaction that required hospitalization:  Has patient had a PCN reaction occurring within the last 10 years:  If all of the above answers are "NO", then may proceed with Cephalosporin use.    Semaglutide Diarrhea and Nausea And Vomiting    Past Medical History:  Diagnosis Date   Allergy    Anxiety    Asthma    outgrew at 36-43 years of age.    Depression    Edema of both feet    GERD (gastroesophageal reflux disease)    Hypertension    Lactose intolerance    Numbness in feet    Sleep apnea      Past Surgical History:  Procedure Laterality Date   BICEPS TENDON REPAIR  11/2013   NASAL SINUS SURGERY     VASECTOMY      Family History  Problem Relation Age of Onset   Stroke Mother    Allergic rhinitis Mother    Sleep apnea Mother    Obesity Mother    Bipolar disorder Mother    Depression Father  suicide   Cancer Father    Diabetes Father    Allergic rhinitis Father    Anxiety disorder Father    Diabetes Paternal Uncle    Alcohol abuse Maternal Grandfather    Hypertension Maternal Grandfather    Alcohol abuse Paternal Grandfather     Social History   Tobacco Use   Smoking status: Former    Current packs/day: 0.00    Average packs/day: 0.3 packs/day for 4.5 years (1.1 ttl pk-yrs)    Types: Cigarettes    Start date: 03/1986    Quit date: 1992    Years since quitting: 32.6    Passive exposure: Never   Smokeless tobacco: Never  Vaping Use   Vaping status: Never Used  Substance Use Topics   Alcohol use: Yes    Alcohol/week: 1.0 standard drink  of alcohol    Types: 1 Cans of beer per week   Drug use: No    ROS   Objective:   Vitals: BP 128/80 (BP Location: Right Arm)   Pulse 86   Temp 98.8 F (37.1 C) (Oral)   Resp 20   SpO2 97%   Physical Exam Constitutional:      General: He is not in acute distress.    Appearance: Normal appearance. He is well-developed and normal weight. He is not ill-appearing, toxic-appearing or diaphoretic.  HENT:     Head: Normocephalic and atraumatic.     Right Ear: Tympanic membrane, ear canal and external ear normal. No drainage, swelling or tenderness. No middle ear effusion. There is no impacted cerumen. Tympanic membrane is not erythematous or bulging.     Left Ear: Tympanic membrane, ear canal and external ear normal. No drainage, swelling or tenderness.  No middle ear effusion. There is no impacted cerumen. Tympanic membrane is not erythematous or bulging.     Nose: Congestion and rhinorrhea present.     Mouth/Throat:     Mouth: Mucous membranes are moist.     Pharynx: No oropharyngeal exudate or posterior oropharyngeal erythema.  Eyes:     General: No scleral icterus.       Right eye: No discharge.        Left eye: No discharge.     Extraocular Movements: Extraocular movements intact.     Conjunctiva/sclera: Conjunctivae normal.  Cardiovascular:     Rate and Rhythm: Normal rate and regular rhythm.     Heart sounds: Normal heart sounds. No murmur heard.    No friction rub. No gallop.  Pulmonary:     Effort: Pulmonary effort is normal. No respiratory distress.     Breath sounds: Normal breath sounds. No stridor. No wheezing, rhonchi or rales.  Musculoskeletal:     Cervical back: Normal range of motion and neck supple. No rigidity. No muscular tenderness.  Neurological:     General: No focal deficit present.     Mental Status: He is alert and oriented to person, place, and time.  Psychiatric:        Mood and Affect: Mood normal.        Behavior: Behavior normal.         Thought Content: Thought content normal.    IM Depo-Medrol 80 mcg administered in clinic.  Assessment and Plan :   PDMP not reviewed this encounter.  1. Allergic rhinitis, unspecified seasonality, unspecified trigger    Acute on chronic allergic rhinitis.  Steroid intervention as above.  Hold Flonase.  Maintain Xyzal, Singulair and restart Claritin.  Deferred imaging given  clear cardiopulmonary exam, hemodynamically stable vital signs. Counseled patient on potential for adverse effects with medications prescribed/recommended today, ER and return-to-clinic precautions discussed, patient verbalized understanding.    Wallis Bamberg, [place] 05/27/23 1212

## 2023-05-27 NOTE — ED Triage Notes (Signed)
Pt c/o nasal congestion, sinus pain/pressure, mild cough day 2-taking OTC meds with some relief-NAD-steady gait

## 2023-05-29 ENCOUNTER — Other Ambulatory Visit (HOSPITAL_COMMUNITY): Payer: Self-pay

## 2023-05-29 ENCOUNTER — Ambulatory Visit (INDEPENDENT_AMBULATORY_CARE_PROVIDER_SITE_OTHER): Payer: 59 | Admitting: Family Medicine

## 2023-05-29 ENCOUNTER — Encounter: Payer: Self-pay | Admitting: Family Medicine

## 2023-05-29 VITALS — BP 129/83 | HR 65 | Resp 18 | Ht 76.0 in | Wt 358.0 lb

## 2023-05-29 DIAGNOSIS — K760 Fatty (change of) liver, not elsewhere classified: Secondary | ICD-10-CM | POA: Diagnosis not present

## 2023-05-29 DIAGNOSIS — E781 Pure hyperglyceridemia: Secondary | ICD-10-CM | POA: Diagnosis not present

## 2023-05-29 DIAGNOSIS — I10 Essential (primary) hypertension: Secondary | ICD-10-CM

## 2023-05-29 DIAGNOSIS — E559 Vitamin D deficiency, unspecified: Secondary | ICD-10-CM

## 2023-05-29 DIAGNOSIS — F3289 Other specified depressive episodes: Secondary | ICD-10-CM

## 2023-05-29 DIAGNOSIS — R7303 Prediabetes: Secondary | ICD-10-CM

## 2023-05-29 DIAGNOSIS — Z0000 Encounter for general adult medical examination without abnormal findings: Secondary | ICD-10-CM | POA: Diagnosis not present

## 2023-05-29 DIAGNOSIS — J3089 Other allergic rhinitis: Secondary | ICD-10-CM | POA: Diagnosis not present

## 2023-05-29 DIAGNOSIS — Z6837 Body mass index (BMI) 37.0-37.9, adult: Secondary | ICD-10-CM | POA: Diagnosis not present

## 2023-05-29 MED ORDER — HYDROCHLOROTHIAZIDE 12.5 MG PO CAPS
12.5000 mg | ORAL_CAPSULE | Freq: Every day | ORAL | 1 refills | Status: DC
  Filled 2023-05-29 – 2023-08-26 (×2): qty 90, 90d supply, fill #0
  Filled 2023-12-13: qty 90, 90d supply, fill #1

## 2023-05-29 MED ORDER — BUPROPION HCL ER (XL) 150 MG PO TB24
300.0000 mg | ORAL_TABLET | Freq: Every day | ORAL | 1 refills | Status: DC
  Filled 2023-05-29: qty 180, 90d supply, fill #0
  Filled 2023-12-13: qty 180, 90d supply, fill #1

## 2023-05-29 MED ORDER — MONTELUKAST SODIUM 10 MG PO TABS
10.0000 mg | ORAL_TABLET | Freq: Every day | ORAL | 1 refills | Status: DC
  Filled 2023-05-29 – 2023-08-10 (×2): qty 90, 90d supply, fill #0
  Filled 2023-11-25: qty 90, 90d supply, fill #1

## 2023-05-29 MED ORDER — LOSARTAN POTASSIUM 100 MG PO TABS
100.0000 mg | ORAL_TABLET | Freq: Every day | ORAL | 1 refills | Status: DC
  Filled 2023-05-29: qty 90, 90d supply, fill #0
  Filled 2023-10-05: qty 90, 90d supply, fill #1

## 2023-05-29 NOTE — Assessment & Plan Note (Signed)
PHQ-9 score 1.  Stable.  Continue Wellbutrin 150 mg daily.

## 2023-05-29 NOTE — Assessment & Plan Note (Signed)
BP goal <140/90.  Stable, at goal.  Continue losartan 100 mg daily and hydrochlorothiazide 12.5 mg daily.  Collecting CMP for medication monitoring today.  Encouraged to continue ambulatory blood pressure monitoring.  Will continue to monitor.

## 2023-05-29 NOTE — Progress Notes (Signed)
Complete physical exam  Patient: Joseph Graves   DOB: [birth year]   52 y.o. Male  MRN: [id number]  Subjective:    Chief Complaint  Patient presents with   Annual Exam    Joseph Graves is a 52 y.o. male who presents today for a complete physical exam. He reports consuming a general diet.  He generally feels well. He reports sleeping well. He does not have additional problems to discuss today other than wanting to make sure that none of his medications may decrease the efficacy of tadalafil.  He is also considering switching from as needed tadalafil to a lower daily dose.    Most recent fall risk assessment:    02/25/2023    8:17 AM  Fall Risk   Falls in the past year? 0  Number falls in past yr: 0  Injury with Fall? 0  Risk for fall due to : No Fall Risks  Follow up Falls evaluation completed     Most recent depression and anxiety screenings:    05/29/2023    8:18 AM 02/25/2023    8:31 AM  PHQ 2/9 Scores  PHQ - 2 Score 0 0  PHQ- 9 Score 1 1      05/29/2023    8:18 AM 02/25/2023    8:31 AM 11/25/2022    7:58 AM 04/21/2022    3:00 PM  GAD 7 : Generalized Anxiety Score  Nervous, Anxious, on Edge 0 1 1 1   Control/stop worrying 0 0 0 0  Worry too much - different things 1 0 0 0  Trouble relaxing 2 1 0 1  Restless 1 0 0 0  Easily annoyed or irritable 1 0 0 0  Afraid - awful might happen 0 0 0 0  Total GAD 7 Score 5 2 1 2   Anxiety Difficulty Not difficult at all Not difficult at all Not difficult at all Somewhat difficult    Patient Active Problem List   Diagnosis Date Noted   Hypertriglyceridemia 05/29/2023   At risk for diabetes mellitus 08/22/2020   Nonalcoholic hepatosteatosis 08/01/2020   Insulin resistance 03/06/2020   B12 deficiency 02/14/2020   Prediabetes 10/20/2019   Low HDL (under 40) 10/20/2019   Elevated ALT measurement 10/20/2019   Testosterone insufficiency 09/26/2019   History of non anemic vitamin B12 deficiency 06/07/2018   GERD (gastroesophageal  reflux disease) 04/27/2017   Environmental and seasonal allergies 04/27/2017   Vitamin D deficiency 04/27/2017   Class 2 severe obesity with serious comorbidity and body mass index (BMI) of 37.0 to 37.9 in adult (HCC) 04/27/2017   Numbness and tingling of both feet 04/27/2017   OSA on CPAP 04/27/2017   Depression 04/27/2017   Essential hypertension 04/27/2017   Erectile dysfunction due to diseases classified elsewhere 04/19/2015    Past Surgical History:  Procedure Laterality Date   BICEPS TENDON REPAIR  11/2013   NASAL SINUS SURGERY     VASECTOMY     Social History   Tobacco Use   Smoking status: Former    Current packs/day: 0.00    Average packs/day: 0.3 packs/day for 15.1 years (3.8 ttl pk-yrs)    Types: Cigarettes    Start date: 03/1986    Quit date: 1992    Years since quitting: 32.7    Passive exposure: Never   Smokeless tobacco: Never  Vaping Use   Vaping status: Never Used  Substance Use Topics   Alcohol use: Yes    Alcohol/week: 1.0 standard drink of  alcohol    Types: 1 Cans of beer per week   Drug use: No   Family History  Problem Relation Age of Onset   Stroke Mother    Allergic rhinitis Mother    Sleep apnea Mother    Obesity Mother    Bipolar disorder Mother    Depression Father        suicide   Cancer Father    Diabetes Father    Allergic rhinitis Father    Anxiety disorder Father    Diabetes Paternal Uncle    Alcohol abuse Maternal Grandfather    Hypertension Maternal Grandfather    Alcohol abuse Paternal Grandfather    Allergies  Allergen Reactions   Amoxicillin-Pot Clavulanate Rash   Penicillins Hives and Other (See Comments)    Has patient had a PCN reaction causing immediate rash, facial/tongue/throat swelling, SOB or lightheadedness with hypotension:  Has patient had a PCN reaction causing severe rash involving mucus membranes or skin necrosis:  Has patient had a PCN reaction that required hospitalization:  Has patient had a PCN  reaction occurring within the last 10 years:  If all of the above answers are "NO", then may proceed with Cephalosporin use.    Semaglutide Diarrhea and Nausea And Vomiting     Patient Care Team: [name], PA as PCP - General (Family Medicine)   Outpatient Medications Prior to Visit  Medication Sig   fluticasone (FLONASE) 50 MCG/ACT nasal spray Place 2 sprays into both nostrils daily.   ibuprofen (ADVIL) 200 MG tablet Take 1 tablet (200 mg total) by mouth every 6 (six) hours as needed.   levocetirizine (XYZAL) 5 MG tablet TAKE 1 TABLET BY MOUTH EVERY EVENING   tadalafil (CIALIS) 20 MG tablet Take 0.5-1 tablets (10-20 mg total) by mouth daily as needed for erectile dysfunction. Take one hour prior to need as directed.   [DISCONTINUED] buPROPion (WELLBUTRIN XL) 150 MG 24 hr tablet Take 2 tablets (300 mg) by mouth daily.   [DISCONTINUED] Cholecalciferol (VITAMIN D3) 125 MCG (5000 UT) capsule 5000 IU Monday-Friday and 10,000 IU on Saturday and Sunday.   [DISCONTINUED] hydrochlorothiazide (MICROZIDE) 12.5 MG capsule Take 1 capsule (12.5 mg) by mouth daily.   [DISCONTINUED] losartan (COZAAR) 100 MG tablet Take 1 tablet by mouth daily.   [DISCONTINUED] montelukast (SINGULAIR) 10 MG tablet Take 1 tablet (10 mg total) by mouth at bedtime.   No facility-administered medications prior to visit.    Review of Systems  Constitutional:  Negative for chills, fever and malaise/fatigue.  HENT:  Negative for congestion and hearing loss.   Eyes:  Negative for blurred vision and double vision.  Respiratory:  Negative for cough and shortness of breath.   Cardiovascular:  Negative for chest pain, palpitations and leg swelling.  Gastrointestinal:  Negative for abdominal pain, constipation, diarrhea and heartburn.  Genitourinary:  Negative for frequency and urgency.  Musculoskeletal:  Negative for myalgias and neck pain.  Neurological:  Negative for headaches.  Endo/Heme/Allergies:  Negative for  polydipsia.  Psychiatric/Behavioral:  Negative for depression. The patient is not nervous/anxious and does not have insomnia.       Objective:    BP 129/83   Pulse 65   Resp 18   Ht 6\' 4"  (1.93 m)   Wt (!) 358 lb (162.4 kg)   SpO2 98%   BMI 43.58 kg/m    Physical Exam Constitutional:      General: He is not in acute distress.    Appearance: Normal appearance.  HENT:     Head: Normocephalic and atraumatic.     Right Ear: Tympanic membrane, ear canal and external ear normal. There is no impacted cerumen.     Left Ear: Tympanic membrane, ear canal and external ear normal. There is no impacted cerumen.     Nose: Nose normal.     Mouth/Throat:     Mouth: Mucous membranes are moist.     Pharynx: Oropharynx is clear. No posterior oropharyngeal erythema.  Eyes:     Extraocular Movements: Extraocular movements intact.     Conjunctiva/sclera: Conjunctivae normal.     Pupils: Pupils are equal, round, and reactive to light.     Comments: Wears glasses, up-to-date  Neck:     Thyroid: No thyroid mass, thyromegaly or thyroid tenderness.  Cardiovascular:     Rate and Rhythm: Normal rate and regular rhythm.     Heart sounds: Normal heart sounds. No murmur heard.    No friction rub. No gallop.  Pulmonary:     Effort: Pulmonary effort is normal. No respiratory distress.     Breath sounds: Normal breath sounds. No stridor. No wheezing or rales.  Abdominal:     General: Bowel sounds are normal.     Palpations: Abdomen is soft. There is no mass.     Tenderness: There is no abdominal tenderness.  Musculoskeletal:        General: Normal range of motion.     Cervical back: Normal range of motion and neck supple.  Lymphadenopathy:     Cervical: No cervical adenopathy.  Skin:    General: Skin is warm and dry.  Neurological:     Mental Status: He is alert and oriented to person, place, and time.     Cranial Nerves: No cranial nerve deficit.     Motor: No weakness.     Deep Tendon  Reflexes: Reflexes normal.  Psychiatric:        Mood and Affect: Mood normal.       Assessment & Plan:    Routine Health Maintenance and Physical Exam  Immunization History  Administered Date(s) Administered   Influenza Split 08/08/2005   Influenza Whole 06/08/2018   Influenza, Seasonal, Injecte, Preservative Fre 06/14/2015   Influenza-Unspecified 06/12/2019, 06/18/2020, 06/26/2022   PFIZER(Purple Top)SARS-COV-2 Vaccination 10/12/2019, 11/02/2019, 02/13/2020   Tdap 04/19/2015    Health Maintenance  Topic Date Due   Zoster Vaccines- Shingrix (1 of 2) Never done   COVID-19 Vaccine (4 - 2023-24 season) 06/14/2023 (Originally 05/24/2023)   INFLUENZA VACCINE  12/21/2023 (Originally 04/23/2023)   Fecal DNA (Cologuard)  11/21/2023   DTaP/Tdap/Td (2 - Td or Tdap) 04/18/2025   Hepatitis C Screening  Completed   HIV Screening  Completed   HPV VACCINES  Aged Out    Repeating labs including CBC, CMP, lipid panel, A1C, TSH, and vitamin D.  Discussed recommendation for shingles vaccine, patient has had increased congestion in the past several days and is going to wait.  Discussed health benefits of physical activity, and encouraged him to engage in regular exercise appropriate for his age and condition.  Wellness examination  Essential hypertension Assessment & Plan: BP goal <140/90.  Stable, at goal.  Continue losartan 100 mg daily and hydrochlorothiazide 12.5 mg daily.  Collecting CMP for medication monitoring today.  Encouraged to continue ambulatory blood pressure monitoring.  Will continue to monitor.  Orders: -     CBC with Differential/Platelet; Future -     Comprehensive metabolic panel; Future -  Losartan Potassium; Take 1 tablet by mouth daily.  Dispense: 90 tablet; Refill: 1 -     hydroCHLOROthiazide; Take 1 capsule (12.5 mg) by mouth daily.  Dispense: 90 capsule; Refill: 1  Nonalcoholic hepatosteatosis -     Comprehensive metabolic panel; Future  Class 2 severe obesity  due to excess calories with serious comorbidity and body mass index (BMI) of 37.0 to 37.9 in adult (HCC) -     CBC with Differential/Platelet; Future -     Comprehensive metabolic panel; Future -     Hemoglobin A1c; Future -     Lipid panel; Future -     TSH Rfx on Abnormal to Free T4; Future -     VITAMIN D 25 Hydroxy (Vit-D Deficiency, Fractures); Future  Prediabetes Assessment & Plan: Last A1c 5.9.  Rechecking A1c today.  Orders: -     Hemoglobin A1c; Future  Vitamin D deficiency -     VITAMIN D 25 Hydroxy (Vit-D Deficiency, Fractures); Future  Hypertriglyceridemia Assessment & Plan: Last lipid panel: LDL 79, HDL 39, triglycerides 151.  Repeating lipid panel today.  Orders: -     Lipid panel; Future  Environmental and seasonal allergies -     Montelukast Sodium; Take 1 tablet (10 mg total) by mouth at bedtime.  Dispense: 90 tablet; Refill: 1  Other depression, with emotional eatimg Assessment & Plan: PHQ-9 score 1.  Stable.  Continue Wellbutrin 150 mg daily.  Orders: -     buPROPion HCl ER (XL); Take 2 tablets (300 mg) by mouth daily.  Dispense: 180 tablet; Refill: 1    Return in about 6 months (around 11/26/2023) for follow-up for HTN, HLD, prediabetes, mood, fasting blood work 1 week before.     [name], PA

## 2023-05-29 NOTE — Assessment & Plan Note (Signed)
Last lipid panel: LDL 79, HDL 39, triglycerides 151.  Repeating lipid panel today.

## 2023-05-29 NOTE — Patient Instructions (Addendum)
From my research, it does not look like the interactions between tadalafil and either losartan or hydrochlorothiazide would decrease the efficacy of tadalafil.  If you would like to switch to a daily dose of tadalafil rather than the as needed pill, you can either get that through [facility] as we discussed or I can prescribe it.  Typically, it is started at a dose of 2.5 mg daily for about a month and after that can be increased to 5 mg daily if needed.  Just send me a message if this is a prescription that you would like me to fill for you, or if you would prefer to get it through [facility].

## 2023-05-29 NOTE — Assessment & Plan Note (Signed)
Last A1c 5.9.  Rechecking A1c today.

## 2023-05-30 LAB — CBC WITH DIFFERENTIAL/PLATELET
Basophils Absolute: 0 10*3/uL (ref 0.0–0.2)
Basos: 1 %
EOS (ABSOLUTE): 0.2 10*3/uL (ref 0.0–0.4)
Eos: 3 %
Hematocrit: 44.1 % (ref 37.5–51.0)
Hemoglobin: 14.5 g/dL (ref 13.0–17.7)
Immature Grans (Abs): 0 10*3/uL (ref 0.0–0.1)
Immature Granulocytes: 0 %
Lymphocytes Absolute: 1.1 10*3/uL (ref 0.7–3.1)
Lymphs: 24 %
MCH: 30.3 pg (ref 26.6–33.0)
MCHC: 32.9 g/dL (ref 31.5–35.7)
MCV: 92 fL (ref 79–97)
Monocytes Absolute: 0.6 10*3/uL (ref 0.1–0.9)
Monocytes: 13 %
Neutrophils Absolute: 2.7 10*3/uL (ref 1.4–7.0)
Neutrophils: 59 %
Platelets: 262 10*3/uL (ref 150–450)
RBC: 4.79 x10E6/uL (ref 4.14–5.80)
RDW: 12.9 % (ref 11.6–15.4)
WBC: 4.6 10*3/uL (ref 3.4–10.8)

## 2023-05-30 LAB — COMPREHENSIVE METABOLIC PANEL
ALT: 45 IU/L — ABNORMAL HIGH (ref 0–44)
AST: 43 IU/L — ABNORMAL HIGH (ref 0–40)
Albumin: 4.5 g/dL (ref 3.8–4.9)
Alkaline Phosphatase: 76 IU/L (ref 44–121)
BUN/Creatinine Ratio: 15 (ref 9–20)
BUN: 14 mg/dL (ref 6–24)
Bilirubin Total: 0.7 mg/dL (ref 0.0–1.2)
CO2: 24 mmol/L (ref 20–29)
Calcium: 9.4 mg/dL (ref 8.7–10.2)
Chloride: 102 mmol/L (ref 96–106)
Creatinine, Ser: 0.91 mg/dL (ref 0.76–1.27)
Globulin, Total: 2.7 g/dL (ref 1.5–4.5)
Glucose: 98 mg/dL (ref 70–99)
Potassium: 4.4 mmol/L (ref 3.5–5.2)
Sodium: 141 mmol/L (ref 134–144)
Total Protein: 7.2 g/dL (ref 6.0–8.5)
eGFR: 101 mL/min/{1.73_m2} (ref >59)

## 2023-05-30 LAB — TSH RFX ON ABNORMAL TO FREE T4: TSH: 2.51 u[IU]/mL (ref 0.450–4.500)

## 2023-05-30 LAB — LIPID PANEL
Chol/HDL Ratio: 3.4 ratio (ref 0.0–5.0)
Cholesterol, Total: 134 mg/dL (ref 100–199)
HDL: 40 mg/dL (ref >39)
LDL Chol Calc (NIH): 76 mg/dL (ref 0–99)
Triglycerides: 98 mg/dL (ref 0–149)
VLDL Cholesterol Cal: 18 mg/dL (ref 5–40)

## 2023-05-30 LAB — VITAMIN D 25 HYDROXY (VIT D DEFICIENCY, FRACTURES): Vit D, 25-Hydroxy: 71.7 ng/mL (ref 30.0–100.0)

## 2023-05-30 LAB — HEMOGLOBIN A1C
Est. average glucose Bld gHb Est-mCnc: 117 mg/dL
Hgb A1c MFr Bld: 5.7 % — ABNORMAL HIGH (ref 4.8–5.6)

## 2023-06-03 ENCOUNTER — Other Ambulatory Visit (HOSPITAL_COMMUNITY): Payer: Self-pay

## 2023-07-07 ENCOUNTER — Other Ambulatory Visit (HOSPITAL_COMMUNITY): Payer: Self-pay

## 2023-08-10 ENCOUNTER — Other Ambulatory Visit (HOSPITAL_COMMUNITY): Payer: Self-pay

## 2023-08-26 ENCOUNTER — Other Ambulatory Visit (HOSPITAL_COMMUNITY): Payer: Self-pay

## 2023-09-21 ENCOUNTER — Other Ambulatory Visit (HOSPITAL_COMMUNITY): Payer: Self-pay

## 2023-09-24 DIAGNOSIS — H524 Presbyopia: Secondary | ICD-10-CM | POA: Diagnosis not present

## 2023-09-24 DIAGNOSIS — H5213 Myopia, bilateral: Secondary | ICD-10-CM | POA: Diagnosis not present

## 2023-10-06 ENCOUNTER — Other Ambulatory Visit (HOSPITAL_COMMUNITY): Payer: Self-pay

## 2023-10-29 ENCOUNTER — Encounter: Payer: Self-pay | Admitting: Family Medicine

## 2023-11-19 ENCOUNTER — Other Ambulatory Visit (HOSPITAL_COMMUNITY): Payer: Self-pay

## 2023-11-19 ENCOUNTER — Ambulatory Visit
Admission: EM | Admit: 2023-11-19 | Discharge: 2023-11-19 | Disposition: A | Discharge status: Home / Self Care | Payer: Commercial Managed Care - PPO | Attending: Emergency Medicine | Admitting: Emergency Medicine

## 2023-11-19 DIAGNOSIS — J0100 Acute maxillary sinusitis, unspecified: Secondary | ICD-10-CM

## 2023-11-19 MED ORDER — DOXYCYCLINE HYCLATE 100 MG PO CAPS
100.0000 mg | ORAL_CAPSULE | Freq: Two times a day (BID) | ORAL | 0 refills | Status: DC
  Filled 2023-11-19: qty 20, 10d supply, fill #0

## 2023-11-19 NOTE — Discharge Instructions (Addendum)
Take the doxycycline as directed.    Follow up with your primary care provider if your symptoms are not improving.    

## 2023-11-19 NOTE — ED Triage Notes (Signed)
 Headache, cough, congestion, sinus pain and pressure, right ear pain and pressure x 2 weeks. Taking mucinex, Theraflu, Flonase, and tylenol sinus with no relief of symptoms.

## 2023-11-19 NOTE — ED Provider Notes (Signed)
 Joseph Graves    CSN: [id number] Arrival date & time: 11/19/23  1155      History   Chief Complaint Chief Complaint  Patient presents with   Facial Pain   Nasal Congestion    HPI Joseph Graves is a 53 y.o. male.  Patient presents with 2-week history of sinus pressure, congestion, sinus headache, cough.  His symptoms are getting worse and he now has ear pain also.  Various OTC treatments attempted without relief.  No fever, chest pain, shortness of breath.  The history is provided by the patient and medical records.    Past Medical History:  Diagnosis Date   Allergy    Anxiety    Asthma    outgrew at 36-75 years of age.    Depression    Edema of both feet    GERD (gastroesophageal reflux disease)    Hypertension    Lactose intolerance    Numbness in feet    Sleep apnea     Patient Active Problem List   Diagnosis Date Noted   Hypertriglyceridemia 05/29/2023   At risk for diabetes mellitus 08/22/2020   Nonalcoholic hepatosteatosis 08/01/2020   Insulin resistance 03/06/2020   B12 deficiency 02/14/2020   Prediabetes 10/20/2019   Low HDL (under 40) 10/20/2019   Elevated ALT measurement 10/20/2019   Testosterone insufficiency 09/26/2019   History of non anemic vitamin B12 deficiency 06/07/2018   GERD (gastroesophageal reflux disease) 04/27/2017   Environmental and seasonal allergies 04/27/2017   Vitamin D deficiency 04/27/2017   Class 2 severe obesity with serious comorbidity and body mass index (BMI) of 37.0 to 37.9 in adult (HCC) 04/27/2017   Numbness and tingling of both feet 04/27/2017   OSA on CPAP 04/27/2017   Depression 04/27/2017   Essential hypertension 04/27/2017   Erectile dysfunction due to diseases classified elsewhere 04/19/2015    Past Surgical History:  Procedure Laterality Date   BICEPS TENDON REPAIR  11/2013   NASAL SINUS SURGERY     VASECTOMY         Home Medications    Prior to Admission medications   Medication Sig Start  Date End Date Taking? Authorizing Provider  buPROPion (WELLBUTRIN XL) 150 MG 24 hr tablet Take 2 tablets (300 mg) by mouth daily. 05/29/23  Yes Saralyn Pilar A, PA  doxycycline (VIBRAMYCIN) 100 MG capsule Take 1 capsule (100 mg total) by mouth 2 (two) times daily. 11/19/23  Yes Mickie Bail, NP  fluticasone (FLONASE) 50 MCG/ACT nasal spray Place 2 sprays into both nostrils daily. 12/31/22  Yes Martin, Mary-Margaret, FNP  hydrochlorothiazide (MICROZIDE) 12.5 MG capsule Take 1 capsule (12.5 mg) by mouth daily. 05/29/23  Yes Saralyn Pilar A, PA  ibuprofen (ADVIL) 200 MG tablet Take 1 tablet (200 mg total) by mouth every 6 (six) hours as needed. 11/25/22  Yes Saralyn Pilar A, PA  levocetirizine (XYZAL) 5 MG tablet TAKE 1 TABLET BY MOUTH EVERY EVENING 11/25/22 11/25/23 Yes Saralyn Pilar A, PA  losartan (COZAAR) 100 MG tablet Take 1 tablet by mouth daily. 05/29/23  Yes Saralyn Pilar A, PA  montelukast (SINGULAIR) 10 MG tablet Take 1 tablet (10 mg total) by mouth at bedtime. 05/29/23  Yes Saralyn Pilar A, PA  tadalafil (CIALIS) 20 MG tablet Take 0.5-1 tablets (10-20 mg total) by mouth daily as needed for erectile dysfunction. Take one hour prior to need as directed. 11/25/22   [name], PA  cetirizine (ZYRTEC) 10 MG tablet Take 1 tablet (10 mg total)  by mouth daily. 08/06/20 08/22/20  Mayer Masker, PA-C    Family History Family History  Problem Relation Age of Onset   Stroke Mother    Allergic rhinitis Mother    Sleep apnea Mother    Obesity Mother    Bipolar disorder Mother    Depression Father        suicide   Cancer Father    Diabetes Father    Allergic rhinitis Father    Anxiety disorder Father    Diabetes Paternal Uncle    Alcohol abuse Maternal Grandfather    Hypertension Maternal Grandfather    Alcohol abuse Paternal Grandfather     Social History Social History   Tobacco Use   Smoking status: Former    Current packs/day: 0.00    Average packs/day: 0.3 packs/day for 15.1  years (3.8 ttl pk-yrs)    Types: Cigarettes    Start date: 03/1986    Quit date: 1992    Years since quitting: 33.1    Passive exposure: Never   Smokeless tobacco: Never  Vaping Use   Vaping status: Never Used  Substance Use Topics   Alcohol use: Yes    Alcohol/week: 1.0 standard drink of alcohol    Types: 1 Cans of beer per week   Drug use: No     Allergies   Amoxicillin-pot clavulanate, Penicillins, and Semaglutide   Review of Systems Review of Systems  Constitutional:  Negative for chills and fever.  HENT:  Positive for congestion, ear pain, postnasal drip, rhinorrhea, sinus pressure and sinus pain. Negative for sore throat.   Respiratory:  Positive for cough. Negative for shortness of breath.   Neurological:  Positive for headaches.     Physical Exam Triage Vital Signs ED Triage Vitals  Encounter Vitals Group     BP      Systolic BP Percentile      Diastolic BP Percentile      Pulse      Resp      Temp      Temp src      SpO2      Weight      Height      Head Circumference      Peak Flow      Pain Score      Pain Loc      Pain Education      Exclude from Growth Chart    No data found.  Updated Vital Signs BP 125/85 (BP Location: Left Arm)   Pulse 64   Temp 98.3 F (36.8 C) (Tympanic)   Resp 18   SpO2 95%   Visual Acuity Right Eye Distance:   Left Eye Distance:   Bilateral Distance:    Right Eye Near:   Left Eye Near:    Bilateral Near:     Physical Exam Constitutional:      General: He is not in acute distress. HENT:     Right Ear: Tympanic membrane normal.     Left Ear: Tympanic membrane normal.     Nose: Congestion present.     Mouth/Throat:     Mouth: Mucous membranes are moist.     Pharynx: Oropharynx is clear.  Cardiovascular:     Rate and Rhythm: Normal rate and regular rhythm.     Heart sounds: Normal heart sounds.  Pulmonary:     Effort: Pulmonary effort is normal. No respiratory distress.     Breath sounds: Normal  breath sounds.  Neurological:  Mental Status: He is alert.      UC Treatments / Results  Labs (all labs ordered are listed, but only abnormal results are displayed) Labs Reviewed - No data to display  EKG   Radiology No results found.  Procedures Procedures (including critical care time)  Medications Ordered in UC Medications - No data to display  Initial Impression / Assessment and Plan / UC Course  I have reviewed the triage vital signs and the nursing notes.  Pertinent labs & imaging results that were available during my care of the patient were reviewed by me and considered in my medical decision making (see chart for details).    Acute sinusitis.  Patient has been symptomatic for 2 weeks and is not improving with OTC treatment.  Treating today with doxycycline.  Education provided on sinus infection.  Instructed patient to follow-up with his PCP if he is not improving.  He agrees to plan of care.  Final Clinical Impressions(s) / UC Diagnoses   Final diagnoses:  Acute non-recurrent maxillary sinusitis     Discharge Instructions      Take the doxycycline as directed.  Follow-up with your primary care provider if your symptoms are not improving.      ED Prescriptions     Medication Sig Dispense Auth. Provider   doxycycline (VIBRAMYCIN) 100 MG capsule Take 1 capsule (100 mg total) by mouth 2 (two) times daily. 20 capsule Mickie Bail, NP      PDMP not reviewed this encounter.   Mickie Bail, NP 11/19/23 [phone]

## 2023-11-26 ENCOUNTER — Other Ambulatory Visit: Payer: Self-pay

## 2024-01-03 ENCOUNTER — Other Ambulatory Visit: Payer: Self-pay | Admitting: Family Medicine

## 2024-01-03 DIAGNOSIS — I10 Essential (primary) hypertension: Secondary | ICD-10-CM

## 2024-01-04 ENCOUNTER — Other Ambulatory Visit (HOSPITAL_COMMUNITY): Payer: Self-pay

## 2024-01-04 MED ORDER — LOSARTAN POTASSIUM 100 MG PO TABS
100.0000 mg | ORAL_TABLET | Freq: Every day | ORAL | 1 refills | Status: DC
  Filled 2024-01-04: qty 90, 90d supply, fill #0

## 2024-01-15 ENCOUNTER — Ambulatory Visit
Admission: EM | Admit: 2024-01-15 | Discharge: 2024-01-15 | Disposition: A | Discharge status: Home / Self Care | Attending: Family Medicine | Admitting: Family Medicine

## 2024-01-15 ENCOUNTER — Other Ambulatory Visit (HOSPITAL_COMMUNITY): Payer: Self-pay

## 2024-01-15 DIAGNOSIS — J302 Other seasonal allergic rhinitis: Secondary | ICD-10-CM

## 2024-01-15 MED ORDER — PREDNISONE 20 MG PO TABS
ORAL_TABLET | ORAL | 0 refills | Status: DC
  Filled 2024-01-15: qty 11, 7d supply, fill #0

## 2024-01-15 MED ORDER — METHYLPREDNISOLONE SODIUM SUCC 125 MG IJ SOLR
125.0000 mg | Freq: Once | INTRAMUSCULAR | Status: AC
Start: 2024-01-15 — End: 2024-01-15
  Administered 2024-01-15: 125 mg via INTRAMUSCULAR

## 2024-01-15 MED ORDER — CEFDINIR 300 MG PO CAPS: ORAL_CAPSULE | ORAL | 0 refills | Status: DC

## 2024-01-15 MED ORDER — BENZONATATE 200 MG PO CAPS
ORAL_CAPSULE | ORAL | 1 refills | Status: DC
  Filled 2024-01-15: qty 20, 7d supply, fill #0

## 2024-01-15 NOTE — ED Triage Notes (Signed)
 Pt c/o cough x 1 week. Delsym and mucinex prn. Also having some ear pressure. Hx of seasonal allergies.

## 2024-01-15 NOTE — Discharge Instructions (Signed)
 Begin prednisone  Saturday 01/16/24.  Continue Singulair . Continue daily Flonase  nasal spray and saline irrigation.   Continue daily antihistamine, but try rotating 2 or 3 different ones on a monthly basis. Try taking the Tessalon  with a dose of Delsym at bedtime. If facial pain/pressure or fever develop after about 5-7 days, begin taking antibiotic.

## 2024-01-15 NOTE — ED Provider Notes (Signed)
 [facility]    CSN: [id number] Arrival date & time: 01/15/24  1032      History   Chief Complaint Chief Complaint  Patient presents with   Cough    HPI Joseph Graves is a 53 y.o. male.   Patient has a long history of seasonal rhinitis.  He reports that he developed increasing sinus congestion and drainage about a week ago, followed by onset of cough two days ago.  He denies fevers, chills, and sweats, sore throat, myalgias, fatigue, pleuritic pain, shortness of breath, etc.  He notes that he takes Singulair , Zyrtec , and Flonase  daily.  His cough is generally worse at night (he uses CPAP).  He had an episode of sinusitis in February but his present symptoms are not as severe as then.  The history is provided by the patient and the spouse.    Past Medical History:  Diagnosis Date   Allergy    Anxiety    Asthma    outgrew at 93-8 years of age.    Depression    Edema of both feet    GERD (gastroesophageal reflux disease)    Hypertension    Lactose intolerance    Numbness in feet    Sleep apnea     Patient Active Problem List   Diagnosis Date Noted   Hypertriglyceridemia 05/29/2023   At risk for diabetes mellitus 08/22/2020   Nonalcoholic hepatosteatosis 08/01/2020   Insulin  resistance 03/06/2020   B12 deficiency 02/14/2020   Prediabetes 10/20/2019   Low HDL (under 40) 10/20/2019   Elevated ALT measurement 10/20/2019   Testosterone  insufficiency 09/26/2019   History of non anemic vitamin B12 deficiency 06/07/2018   GERD (gastroesophageal reflux disease) 04/27/2017   Environmental and seasonal allergies 04/27/2017   Vitamin D  deficiency 04/27/2017   Class 2 severe obesity with serious comorbidity and body mass index (BMI) of 37.0 to 37.9 in adult (HCC) 04/27/2017   Numbness and tingling of both feet 04/27/2017   OSA on CPAP 04/27/2017   Depression 04/27/2017   Essential hypertension 04/27/2017   Erectile dysfunction due to diseases classified  elsewhere 04/19/2015    Past Surgical History:  Procedure Laterality Date   BICEPS TENDON REPAIR  11/2013   NASAL SINUS SURGERY     VASECTOMY         Home Medications    Prior to Admission medications   Medication Sig Start Date End Date Taking? Authorizing Provider  benzonatate  (TESSALON ) 200 MG capsule Take one cap one to 3 times daily (especially at bedtime) as needed for cough 01/15/24  Yes Leon Rajas, MD  cefdinir (OMNICEF) 300 MG capsule Take one cap PO Q12hr 01/15/24  Yes Leon Rajas, MD  predniSONE  (DELTASONE ) 20 MG tablet Take one tab by mouth twice daily for 4 days, then one daily for 3 days. Take with food. 01/15/24  Yes Leon Rajas, MD  buPROPion  (WELLBUTRIN  XL) 150 MG 24 hr tablet Take 2 tablets (300 mg) by mouth daily. 05/29/23   Noreene Bearded, PA  fluticasone  (FLONASE ) 50 MCG/ACT nasal spray Place 2 sprays into both nostrils daily. 12/31/22   Gaylyn Keas, Mary-Margaret, FNP  hydrochlorothiazide  (MICROZIDE ) 12.5 MG capsule Take 1 capsule (12.5 mg) by mouth daily. 05/29/23   Noreene Bearded, PA  ibuprofen  (ADVIL ) 200 MG tablet Take 1 tablet (200 mg total) by mouth every 6 (six) hours as needed. 11/25/22   Noreene Bearded, PA  levocetirizine (XYZAL ) 5 MG tablet TAKE 1 TABLET BY MOUTH  EVERY EVENING 11/25/22 11/25/23  Maryclare Smoke A, PA  losartan  (COZAAR ) 100 MG tablet Take 1 tablet by mouth daily. 01/04/24   Laneta Pintos, MD  montelukast  (SINGULAIR ) 10 MG tablet Take 1 tablet (10 mg total) by mouth at bedtime. 05/29/23   Noreene Bearded, PA  tadalafil  (CIALIS ) 20 MG tablet Take 0.5-1 tablets (10-20 mg total) by mouth daily as needed for erectile dysfunction. Take one hour prior to need as directed. 11/25/22   Noreene Bearded, PA  cetirizine  (ZYRTEC ) 10 MG tablet Take 1 tablet (10 mg total) by mouth daily. 08/06/20 08/22/20  Karron Pagan, PA-C    Family History Family History  Problem Relation Age of Onset   Stroke Mother    Allergic rhinitis Mother    Sleep  apnea Mother    Obesity Mother    Bipolar disorder Mother    Depression Father        suicide   Cancer Father    Diabetes Father    Allergic rhinitis Father    Anxiety disorder Father    Diabetes Paternal Uncle    Alcohol abuse Maternal Grandfather    Hypertension Maternal Grandfather    Alcohol abuse Paternal Grandfather     Social History Social History   Tobacco Use   Smoking status: Former    Current packs/day: 0.00    Average packs/day: 0.3 packs/day for 15.1 years (3.8 ttl pk-yrs)    Types: Cigarettes    Start date: 03/1986    Quit date: 1992    Years since quitting: 33.3    Passive exposure: Never   Smokeless tobacco: Never  Vaping Use   Vaping status: Never Used  Substance Use Topics   Alcohol use: Yes    Alcohol/week: 1.0 standard drink of alcohol    Types: 1 Cans of beer per week   Drug use: No     Allergies   Amoxicillin-pot clavulanate, Penicillins, and Semaglutide    Review of Systems Review of Systems ? minimal sore throat + cough No pleuritic pain No wheezing + nasal congestion + post-nasal drainage No sinus pain/pressure No itchy/red eyes No earache No hemoptysis No SOB No fever/chills No nausea No vomiting No abdominal pain No diarrhea No urinary symptoms No skin rash No fatigue No myalgias No headache    Physical Exam Triage Vital Signs ED Triage Vitals  Encounter Vitals Group     BP 01/15/24 1038 (!) 163/107     Systolic BP Percentile --      Diastolic BP Percentile --      Pulse Rate 01/15/24 1038 64     Resp 01/15/24 1038 18     Temp 01/15/24 1038 98 F (36.7 C)     Temp Source 01/15/24 1038 Oral     SpO2 01/15/24 1038 97 %     Weight --      Height --      Head Circumference --      Peak Flow --      Pain Score 01/15/24 1039 0     Pain Loc --      Pain Education --      Exclude from Growth Chart --    No data found.  Updated Vital Signs BP (!) 163/107 (BP Location: Right Arm)   Pulse 64   Temp 98 F  (36.7 C) (Oral)   Resp 18   SpO2 97%   Visual Acuity Right Eye Distance:   Left Eye Distance:   Bilateral Distance:  Right Eye Near:   Left Eye Near:    Bilateral Near:     Physical Exam Nursing notes and Vital Signs reviewed. Appearance:  Patient appears stated age, and in no acute distress Eyes:  Pupils are equal, round, and reactive to light and accomodation.  Extraocular movement is intact.  Conjunctivae are not inflamed  Ears:  Canals normal.  Tympanic membranes normal.  Nose:  Congested turbinates.  No sinus tenderness.  Pharynx:  Normal Neck:  Supple. No adenopathy.  Lungs:  Generally clear; one faint wheeze heard left upper posterior chest.  Breath sounds are equal.  Moving air well. Heart:  Regular rate and rhythm without murmurs, rubs, or gallops.  Abdomen:  Nontender without masses or hepatosplenomegaly.  Bowel sounds are present.  No CVA or flank tenderness.  Extremities:  No edema.  Skin:  No rash present.   UC Treatments / Results  Labs (all labs ordered are listed, but only abnormal results are displayed) Labs Reviewed - No data to display  EKG   Radiology No results found.  Procedures Procedures (including critical care time)  Medications Ordered in UC Medications  methylPREDNISolone  sodium succinate (SOLU-MEDROL ) 125 mg/2 mL injection 125 mg (125 mg Intramuscular Given 01/15/24 1140)    Initial Impression / Assessment and Plan / UC Course  I have reviewed the triage vital signs and the nursing notes.  Pertinent labs & imaging results that were available during my care of the patient were reviewed by me and considered in my medical decision making (see chart for details).    There is no evidence of bacterial infection today.  Treat symptomatically for now. Administered Solumedrol 125mg  IM, then begin prednisone  burst/taper tomorrow. Given Rx for Tessalon  Perles 200mg . Given Rx for Omnicef to hold (patient reports that he has had no adverse  reaction in the past). Followup with Family Doctor if not improved in about 10 days.   Final Clinical Impressions(s) / UC Diagnoses   Final diagnoses:  Acute seasonal allergic rhinitis     Discharge Instructions      Begin prednisone  Saturday 01/16/24.  Continue Singulair . Continue daily Flonase  nasal spray and saline irrigation.   Continue daily antihistamine, but try rotating 2 or 3 different ones on a monthly basis. Try taking the Tessalon  with a dose of Delsym at bedtime. If facial pain/pressure or fever develop after about 5-7 days, begin taking antibiotic.    ED Prescriptions     Medication Sig Dispense Auth. Provider   predniSONE  (DELTASONE ) 20 MG tablet Take one tab by mouth twice daily for 4 days, then one daily for 3 days. Take with food. 11 tablet Leon Rajas, MD   benzonatate  (TESSALON ) 200 MG capsule Take one cap one to 3 times daily (especially at bedtime) as needed for cough 20 capsule Leon Rajas, MD   cefdinir (OMNICEF) 300 MG capsule Take one cap PO Q12hr 14 capsule Leon Rajas, MD         Leon Rajas, MD 01/15/24 1201

## 2024-01-27 ENCOUNTER — Ambulatory Visit
Admission: EM | Admit: 2024-01-27 | Discharge: 2024-01-27 | Disposition: A | Discharge status: Home / Self Care | Attending: Family Medicine | Admitting: Family Medicine

## 2024-01-27 ENCOUNTER — Encounter: Payer: Self-pay | Admitting: Family Medicine

## 2024-01-27 ENCOUNTER — Other Ambulatory Visit (HOSPITAL_COMMUNITY): Payer: Self-pay

## 2024-01-27 DIAGNOSIS — J0100 Acute maxillary sinusitis, unspecified: Secondary | ICD-10-CM

## 2024-01-27 DIAGNOSIS — J309 Allergic rhinitis, unspecified: Secondary | ICD-10-CM

## 2024-01-27 DIAGNOSIS — R062 Wheezing: Secondary | ICD-10-CM

## 2024-01-27 DIAGNOSIS — J329 Chronic sinusitis, unspecified: Secondary | ICD-10-CM

## 2024-01-27 MED ORDER — FEXOFENADINE HCL 180 MG PO TABS
180.0000 mg | ORAL_TABLET | Freq: Every day | ORAL | 0 refills | Status: DC
  Filled 2024-01-27: qty 30, 30d supply, fill #0

## 2024-01-27 MED ORDER — DOXYCYCLINE HYCLATE 100 MG PO CAPS
100.0000 mg | ORAL_CAPSULE | Freq: Two times a day (BID) | ORAL | 0 refills | Status: AC
  Filled 2024-01-27: qty 20, 10d supply, fill #0

## 2024-01-27 MED ORDER — PREDNISONE 10 MG PO TABS
ORAL_TABLET | Freq: Every day | ORAL | 0 refills | Status: DC
  Filled 2024-01-27: qty 42, 6d supply, fill #0

## 2024-01-27 NOTE — ED Triage Notes (Signed)
 Pt c/o continued to nasal congestion since 4/18. Was seen in UC on 4/25. Tx with prednisone . New sxs of HA x 2 days. No fever. Coricidin prn.

## 2024-01-27 NOTE — Discharge Instructions (Addendum)
 Advised patient to take medications as directed with food to completion.  Advised patient to take prednisone  with first dose of doxycycline  for the next 10 days.  Advised may use Allegra daily for the next 5 days, then as needed for concurrent postnasal drainage/drip/runny nose.  Encouraged to increase daily water intake to 64 ounces per day while taking these medications.

## 2024-01-27 NOTE — ED Provider Notes (Signed)
 [name]    CSN: [id number] Arrival date & time: 01/27/24  1108      History   Chief Complaint Chief Complaint  Patient presents with   Nasal Congestion    HPI Joseph Graves is a 53 y.o. male.   HPI 53 year old male presents with continued nasal congestion since 4/18.  Patient was evaluated in urgent care on 01/15/2024 and treated with prednisone  please see epic for that encounter note.  Patient is accompanied by his wife this afternoon who will also be evaluated.  PMH significant for morbid/severe obesity, anxiety, and HTN.  Past Medical History:  Diagnosis Date   Allergy    Anxiety    Asthma    outgrew at 83-6 years of age.    Depression    Edema of both feet    GERD (gastroesophageal reflux disease)    Hypertension    Lactose intolerance    Numbness in feet    Sleep apnea     Patient Active Problem List   Diagnosis Date Noted   Hypertriglyceridemia 05/29/2023   At risk for diabetes mellitus 08/22/2020   Nonalcoholic hepatosteatosis 08/01/2020   Insulin  resistance 03/06/2020   B12 deficiency 02/14/2020   Prediabetes 10/20/2019   Low HDL (under 40) 10/20/2019   Elevated ALT measurement 10/20/2019   Testosterone  insufficiency 09/26/2019   History of non anemic vitamin B12 deficiency 06/07/2018   GERD (gastroesophageal reflux disease) 04/27/2017   Environmental and seasonal allergies 04/27/2017   Vitamin D  deficiency 04/27/2017   Class 2 severe obesity with serious comorbidity and body mass index (BMI) of 37.0 to 37.9 in adult (HCC) 04/27/2017   Numbness and tingling of both feet 04/27/2017   OSA on CPAP 04/27/2017   Depression 04/27/2017   Essential hypertension 04/27/2017   Erectile dysfunction due to diseases classified elsewhere 04/19/2015    Past Surgical History:  Procedure Laterality Date   BICEPS TENDON REPAIR  11/2013   NASAL SINUS SURGERY     VASECTOMY         Home Medications    Prior to Admission medications    Medication Sig Start Date End Date Taking? Authorizing Provider  doxycycline  (VIBRAMYCIN ) 100 MG capsule Take 1 capsule (100 mg total) by mouth 2 (two) times daily for 10 days. 01/27/24 02/06/24 Yes Leonides Ramp, FNP  fexofenadine [facility] ALLERGY) 180 MG tablet Take 1 tablet (180 mg total) by mouth daily. 01/27/24 02/26/24 Yes Leonides Ramp, FNP  predniSONE  (DELTASONE ) 10 MG tablet Take 6 tablets x 2 days; Take 5 tablets x 2 days; Take 4 tablets x 2 days; Take 3 tablets x 2 days; Take 2 tablets x 2 days; Take 1 tablet x 2 days. 01/27/24  Yes Leonides Ramp, FNP  buPROPion  (WELLBUTRIN  XL) 150 MG 24 hr tablet Take 2 tablets (300 mg) by mouth daily. 05/29/23   Noreene Bearded, PA  fluticasone  (FLONASE ) 50 MCG/ACT nasal spray Place 2 sprays into both nostrils daily. 12/31/22   Gaylyn Keas, Mary-Margaret, FNP  hydrochlorothiazide  (MICROZIDE ) 12.5 MG capsule Take 1 capsule (12.5 mg) by mouth daily. 05/29/23   Noreene Bearded, PA  ibuprofen  (ADVIL ) 200 MG tablet Take 1 tablet (200 mg total) by mouth every 6 (six) hours as needed. 11/25/22   Noreene Bearded, PA  levocetirizine (XYZAL ) 5 MG tablet TAKE 1 TABLET BY MOUTH EVERY EVENING 11/25/22 11/25/23  Maryclare Smoke A, PA  losartan  (COZAAR ) 100 MG tablet Take 1 tablet by mouth daily. 01/04/24   Laneta Pintos, MD  montelukast  (SINGULAIR ) 10 MG tablet Take 1 tablet (10 mg total) by mouth at bedtime. 05/29/23   Noreene Bearded, PA  tadalafil  (CIALIS ) 20 MG tablet Take 0.5-1 tablets (10-20 mg total) by mouth daily as needed for erectile dysfunction. Take one hour prior to need as directed. 11/25/22   Noreene Bearded, PA  cetirizine  (ZYRTEC ) 10 MG tablet Take 1 tablet (10 mg total) by mouth daily. 08/06/20 08/22/20  Karron Pagan, PA-C    Family History Family History  Problem Relation Age of Onset   Stroke Mother    Allergic rhinitis Mother    Sleep apnea Mother    Obesity Mother    Bipolar disorder Mother    Depression Father        suicide   Cancer Father     Diabetes Father    Allergic rhinitis Father    Anxiety disorder Father    Diabetes Paternal Uncle    Alcohol abuse Maternal Grandfather    Hypertension Maternal Grandfather    Alcohol abuse Paternal Grandfather     Social History Social History   Tobacco Use   Smoking status: Former    Current packs/day: 0.00    Average packs/day: 0.3 packs/day for 15.1 years (3.8 ttl pk-yrs)    Types: Cigarettes    Start date: 03/1986    Quit date: 1992    Years since quitting: 33.3    Passive exposure: Never   Smokeless tobacco: Never  Vaping Use   Vaping status: Never Used  Substance Use Topics   Alcohol use: Yes    Alcohol/week: 1.0 standard drink of alcohol    Types: 1 Cans of beer per week   Drug use: No     Allergies   Amoxicillin-pot clavulanate, Penicillins, and Semaglutide    Review of Systems Review of Systems  HENT:  Positive for congestion, postnasal drip and rhinorrhea.   Respiratory:  Positive for cough and wheezing.   All other systems reviewed and are negative.    Physical Exam Triage Vital Signs ED Triage Vitals  Encounter Vitals Group     BP 01/27/24 1126 (!) 153/106     Systolic BP Percentile --      Diastolic BP Percentile --      Pulse Rate 01/27/24 1126 (!) 59     Resp 01/27/24 1126 18     Temp 01/27/24 1126 98.1 F (36.7 C)     Temp Source 01/27/24 1126 Oral     SpO2 01/27/24 1126 97 %     Weight --      Height --      Head Circumference --      Peak Flow --      Pain Score 01/27/24 1128 2     Pain Loc --      Pain Education --      Exclude from Growth Chart --    No data found.  Updated Vital Signs BP (!) 153/106 (BP Location: Right Arm)   Pulse (!) 59   Temp 98.1 F (36.7 C) (Oral)   Resp 18   SpO2 97%    Physical Exam Vitals and nursing note reviewed.  Constitutional:      Appearance: Normal appearance. He is obese. He is ill-appearing.  HENT:     Head: Normocephalic and atraumatic.     Right Ear: Tympanic membrane and  external ear normal.     Left Ear: Tympanic membrane and external ear normal.     Ears:  Comments: Significant eustachian tube dysfunction noted bilaterally    Mouth/Throat:     Mouth: Mucous membranes are moist.     Pharynx: Oropharynx is clear.     Comments: Significant amount of clear drainage of posterior oropharynx noted Eyes:     Extraocular Movements: Extraocular movements intact.     Conjunctiva/sclera: Conjunctivae normal.     Pupils: Pupils are equal, round, and reactive to light.  Cardiovascular:     Rate and Rhythm: Normal rate and regular rhythm.     Pulses: Normal pulses.     Heart sounds: Normal heart sounds.  Pulmonary:     Effort: Pulmonary effort is normal.     Breath sounds: Wheezing present. No rhonchi or rales.     Comments: Mild inspiratory wheeze noted throughout, infrequent nonproductive cough on exam Musculoskeletal:        General: Normal range of motion.     Cervical back: Normal range of motion and neck supple.  Skin:    General: Skin is warm and dry.  Neurological:     General: No focal deficit present.     Mental Status: He is alert and oriented to person, place, and time. Mental status is at baseline.  Psychiatric:        Mood and Affect: Mood normal.        Behavior: Behavior normal.      UC Treatments / Results  Labs (all labs ordered are listed, but only abnormal results are displayed) Labs Reviewed - No data to display  EKG   Radiology No results found.  Procedures Procedures (including critical care time)  Medications Ordered in UC Medications - No data to display  Initial Impression / Assessment and Plan / UC Course  I have reviewed the triage vital signs and the nursing notes.  Pertinent labs & imaging results that were available during my care of the patient were reviewed by me and considered in my medical decision making (see chart for details).     MDM: 1.  Sinobronchitis-Rx'd doxycycline  100 mg capsule: Take 1  capsule twice daily x 10 days; 2.  Wheezing-Rx'd Sterapred Unipak 21 tab 10 mg taper; 3.  Allergic rhinitis, unspecified seasonality, unspecified trigger-Rx'd Allegra 180 mg tablet: Take 1 tablet daily x 5 days, as needed. Advised patient to take medications as directed with food to completion.  Advised patient to take prednisone  with first dose of doxycycline  for the next 10 days.  Advised may use Allegra daily for the next 5 days, then as needed for concurrent postnasal drainage/drip/runny nose.  Encouraged to increase daily water intake to 64 ounces per day while taking these medications.  Patient discharged home, hemodynamically stable. Final Clinical Impressions(s) / UC Diagnoses   Final diagnoses:  Sinobronchitis  Wheezing  Allergic rhinitis, unspecified seasonality, unspecified trigger  Acute maxillary sinusitis, recurrence not specified     Discharge Instructions      Advised patient to take medications as directed with food to completion.  Advised patient to take prednisone  with first dose of doxycycline  for the next 10 days.  Advised may use Allegra daily for the next 5 days, then as needed for concurrent postnasal drainage/drip/runny nose.  Encouraged to increase daily water intake to 64 ounces per day while taking these medications.     ED Prescriptions     Medication Sig Dispense Auth. Provider   doxycycline  (VIBRAMYCIN ) 100 MG capsule Take 1 capsule (100 mg total) by mouth 2 (two) times daily for 10 days. 20 capsule  Leonides Ramp, FNP   predniSONE  (DELTASONE ) 10 MG tablet Take 6 tablets x 2 days; Take 5 tablets x 2 days; Take 4 tablets x 2 days; Take 3 tablets x 2 days; Take 2 tablets x 2 days; Take 1 tablet x 2 days. 42 tablet Nyleah Mcginnis, FNP   fexofenadine (ALLEGRA ALLERGY) 180 MG tablet Take 1 tablet (180 mg total) by mouth daily. 30 tablet Debanhi Blaker, FNP      PDMP not reviewed this encounter.   Leonides Ramp, FNP 01/27/24 1246

## 2024-01-27 NOTE — Telephone Encounter (Signed)
 Patient has been schedule.

## 2024-02-25 ENCOUNTER — Other Ambulatory Visit: Payer: Self-pay | Admitting: Family Medicine

## 2024-02-25 ENCOUNTER — Other Ambulatory Visit (HOSPITAL_COMMUNITY): Payer: Self-pay

## 2024-02-25 DIAGNOSIS — J3089 Other allergic rhinitis: Secondary | ICD-10-CM

## 2024-02-25 DIAGNOSIS — N521 Erectile dysfunction due to diseases classified elsewhere: Secondary | ICD-10-CM

## 2024-02-25 DIAGNOSIS — E349 Endocrine disorder, unspecified: Secondary | ICD-10-CM

## 2024-02-25 MED ORDER — TADALAFIL 20 MG PO TABS
10.0000 mg | ORAL_TABLET | Freq: Every day | ORAL | 1 refills | Status: AC | PRN
  Filled 2024-02-25: qty 6, 6d supply, fill #0
  Filled 2024-06-14 (×3): qty 6, 30d supply, fill #1

## 2024-02-25 MED ORDER — MONTELUKAST SODIUM 10 MG PO TABS
10.0000 mg | ORAL_TABLET | Freq: Every day | ORAL | 1 refills | Status: AC
  Filled 2024-02-25: qty 90, 90d supply, fill #0

## 2024-02-26 ENCOUNTER — Other Ambulatory Visit (HOSPITAL_COMMUNITY): Payer: Self-pay

## 2024-03-01 ENCOUNTER — Ambulatory Visit: Admitting: Family Medicine

## 2024-03-01 ENCOUNTER — Other Ambulatory Visit (HOSPITAL_COMMUNITY): Payer: Self-pay

## 2024-03-03 ENCOUNTER — Ambulatory Visit: Admitting: Family Medicine

## 2024-03-03 ENCOUNTER — Telehealth: Payer: Self-pay | Admitting: *Deleted

## 2024-03-03 NOTE — Telephone Encounter (Signed)
 LVM for pt to call and reschedule his appointment for today since Dr. Arabella Beach is out sick. Will send a MyChart message as well.

## 2024-03-09 ENCOUNTER — Ambulatory Visit: Admitting: Primary Care

## 2024-03-09 ENCOUNTER — Other Ambulatory Visit: Payer: Self-pay

## 2024-03-09 ENCOUNTER — Ambulatory Visit: Payer: Self-pay | Admitting: Primary Care

## 2024-03-09 ENCOUNTER — Other Ambulatory Visit (HOSPITAL_COMMUNITY): Payer: Self-pay

## 2024-03-09 VITALS — BP 144/92 | HR 63 | Temp 98.1°F | Ht 74.33 in | Wt 373.6 lb

## 2024-03-09 DIAGNOSIS — E538 Deficiency of other specified B group vitamins: Secondary | ICD-10-CM

## 2024-03-09 DIAGNOSIS — R200 Anesthesia of skin: Secondary | ICD-10-CM | POA: Diagnosis not present

## 2024-03-09 DIAGNOSIS — J3089 Other allergic rhinitis: Secondary | ICD-10-CM

## 2024-03-09 DIAGNOSIS — D229 Melanocytic nevi, unspecified: Secondary | ICD-10-CM | POA: Diagnosis not present

## 2024-03-09 DIAGNOSIS — R7303 Prediabetes: Secondary | ICD-10-CM | POA: Diagnosis not present

## 2024-03-09 DIAGNOSIS — E781 Pure hyperglyceridemia: Secondary | ICD-10-CM

## 2024-03-09 DIAGNOSIS — R202 Paresthesia of skin: Secondary | ICD-10-CM | POA: Diagnosis not present

## 2024-03-09 DIAGNOSIS — F3342 Major depressive disorder, recurrent, in full remission: Secondary | ICD-10-CM | POA: Diagnosis not present

## 2024-03-09 DIAGNOSIS — I10 Essential (primary) hypertension: Secondary | ICD-10-CM

## 2024-03-09 LAB — COMPREHENSIVE METABOLIC PANEL WITH GFR
ALT: 35 U/L (ref 0–53)
AST: 31 U/L (ref 0–37)
Albumin: 4.2 g/dL (ref 3.5–5.2)
Alkaline Phosphatase: 49 U/L (ref 39–117)
BUN: 17 mg/dL (ref 6–23)
CO2: 27 meq/L (ref 19–32)
Calcium: 9.2 mg/dL (ref 8.4–10.5)
Chloride: 107 meq/L (ref 96–112)
Creatinine, Ser: 0.83 mg/dL (ref 0.40–1.50)
GFR: 99.27 mL/min (ref >60.00)
Glucose, Bld: 109 mg/dL — ABNORMAL HIGH (ref 70–99)
Potassium: 4 meq/L (ref 3.5–5.1)
Sodium: 141 meq/L (ref 135–145)
Total Bilirubin: 0.6 mg/dL (ref 0.2–1.2)
Total Protein: 6.5 g/dL (ref 6.0–8.3)

## 2024-03-09 LAB — LIPID PANEL
Cholesterol: 145 mg/dL (ref 0–200)
HDL: 40.3 mg/dL (ref >39.00)
LDL Cholesterol: 71 mg/dL (ref 0–99)
NonHDL: 104.41
Total CHOL/HDL Ratio: 4
Triglycerides: 168 mg/dL — ABNORMAL HIGH (ref 0.0–149.0)
VLDL: 33.6 mg/dL (ref 0.0–40.0)

## 2024-03-09 LAB — VITAMIN B12: Vitamin B-12: 305 pg/mL (ref 211–911)

## 2024-03-09 LAB — HEMOGLOBIN A1C: Hgb A1c MFr Bld: 6.3 % (ref 4.6–6.5)

## 2024-03-09 MED ORDER — LOSARTAN POTASSIUM-HCTZ 100-25 MG PO TABS
1.0000 | ORAL_TABLET | Freq: Every day | ORAL | 0 refills | Status: DC
  Filled 2024-03-09: qty 90, 90d supply, fill #0

## 2024-03-09 MED ORDER — CARBINOXAMINE MALEATE 4 MG PO TABS
4.0000 mg | ORAL_TABLET | Freq: Two times a day (BID) | ORAL | 0 refills | Status: DC
  Filled 2024-03-09: qty 180, 90d supply, fill #0

## 2024-03-09 NOTE — Assessment & Plan Note (Signed)
 Repeat A1c pending

## 2024-03-09 NOTE — Progress Notes (Signed)
 Subjective:    Patient ID: Joseph Graves, male    DOB: 1971/03/19, 53 y.o.   MRN: [id number]  Sinusitis Associated symptoms include congestion and headaches. Pertinent negatives include no coughing.    Joseph Graves is a very pleasant 53 y.o. male with a history of hypertension, OSA on CPAP, GERD, nonalcoholic hepatosteatosis, depression, testosterone  insufficiency who presents today to re-establish care.  1) Hypertension: Currently managed on losartan  100 mg daily, hydrochlorothiazide  12.5 mg daily.  Also managed on CPAP machine for OSA.  Is compliant nightly.  He recently visited his optometrist to notify him of evidence of hypertension upon exam. He does not check his BP at home. He denies chest pain, dizziness.   BP Readings from Last 3 Encounters:  03/09/24 (!) 144/92  01/27/24 (!) 153/106  01/15/24 (!) 163/107     2) MDD: Currently managed on bupropion  XL 300 mg daily. He is currently alternating between 150 mg and 300 mg every other day. Overall feels well managed on this regimen.   3) Environmental and Seasonal Allergies: Currently managed on montelukast  10 mg daily, cetirizine  10 mg daily for fexofenadine  180 mg daily. He is currently taking Zyrtec , Allegra , and Singulair .  Symptoms include chronic head congestion, itchy/water/sticky eyes, post nasal drip, ear fullness, rhinorrhea, nasal congestion. He's had 2 sinus infections since February.   Chronic history of allergies since the age of 80. He underwent sinus resection about 18 years ago. Previously following with ENT and allergist. He has completed allergy injections during childhood and adulthood. He could not tolerate the higher dose as an adult.   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 He is particularly concerned about a nevus to his right cheek. He would like a dermatology referral.  5) Lower Extremity Pain: Chronic for years. Currently with symptoms of burning/tingling sensation to bilateral plantar feet. His  symptoms are worse when laying down at night and when in the recliner. He hardly notices his symptoms during the day. He went to the [employer] store in October 2024, hasn't noticed a significant difference. He does have a history of prediabetes, last A1C was 5.7 in September 2024. He has been taking a vitamin B12 supplement for months.   He has been evaluated by neurology, underwent EMG testing in 2022 which was negative.   Review of Systems  Constitutional:  Negative for fever.  HENT:  Positive for congestion and postnasal drip.   Eyes:  Positive for itching.  Respiratory:  Negative for cough.   Cardiovascular:  Negative for chest pain.  Gastrointestinal:  Negative for constipation and diarrhea.  Genitourinary:  Negative for difficulty urinating.  Musculoskeletal:  Negative for arthralgias.  Skin:        Multiple nevi  Allergic/Immunologic: Positive for environmental allergies.  Neurological:  Positive for numbness and headaches. Negative for dizziness.  Psychiatric/Behavioral:  The patient is not nervous/anxious.          Past Medical History:  Diagnosis Date   Allergy    Anxiety    Asthma    outgrew at 55-44 years of age.    Depression    Edema of both feet    GERD (gastroesophageal reflux disease)    Hypertension    Lactose intolerance    Numbness in feet    Sleep apnea     Social History   Socioeconomic History   Marital status: Married    Spouse name: Lorelee Roger   Number of children: Not on file   Years of  education: Not on file   Highest education level: Bachelor's degree (e.g., BA, AB, BS)  Occupational History   Occupation: Museum/gallery exhibitions officer  Tobacco Use   Smoking status: Former    Current packs/day: 0.00    Average packs/day: 0.3 packs/day for 15.1 years (3.8 ttl pk-yrs)    Types: Cigarettes    Start date: 03/1986    Quit date: 1992    Years since quitting: 33.4    Passive exposure: Never   Smokeless tobacco: Never  Vaping Use   Vaping status:  Never Used  Substance and Sexual Activity   Alcohol use: Yes    Alcohol/week: 1.0 standard drink of alcohol    Types: 1 Cans of beer per week   Drug use: No   Sexual activity: Yes    Birth control/protection: Surgical  Other Topics Concern   Not on file  Social History Narrative   Not on file   Social Drivers of Health   Financial Resource Strain: Low Risk  (03/09/2024)   Overall Financial Resource Strain (CARDIA)    Difficulty of Paying Living Expenses: Not very hard  Food Insecurity: No Food Insecurity (03/09/2024)   Hunger Vital Sign    Worried About Running Out of Food in the Last Year: Never true    Ran Out of Food in the Last Year: Never true  Transportation Needs: No Transportation Needs (03/09/2024)   PRAPARE - Administrator, Civil Service (Medical): No    Lack of Transportation (Non-Medical): No  Physical Activity: Insufficiently Active (03/09/2024)   Exercise Vital Sign    Days of Exercise per Week: 2 days    Minutes of Exercise per Session: 10 min  Stress: Stress Concern Present (03/09/2024)   [employer] of Occupational Health - Occupational Stress Questionnaire    Feeling of Stress: To some extent  Social Connections: Socially Integrated (03/09/2024)   Social Connection and Isolation Panel    Frequency of Communication with Friends and Family: Twice a week    Frequency of Social Gatherings with Friends and Family: Once a week    Attends Religious Services: More than 4 times per year    Active Member of [employer] or Organizations: Yes    Attends Engineer, structural: More than 4 times per year    Marital Status: Married  Catering manager Violence: Not on file    Past Surgical History:  Procedure Laterality Date   BICEPS TENDON REPAIR  11/2013   NASAL SINUS SURGERY     VASECTOMY      Family History  Problem Relation Age of Onset   Stroke Mother    Allergic rhinitis Mother    Sleep apnea Mother    Obesity Mother    Bipolar disorder  Mother    Depression Father        suicide   Cancer Father    Diabetes Father    Allergic rhinitis Father    Anxiety disorder Father    Diabetes Paternal Uncle    Alcohol abuse Maternal Grandfather    Hypertension Maternal Grandfather    Alcohol abuse Paternal Grandfather     Allergies  Allergen Reactions   Amoxicillin-Pot Clavulanate Rash   Penicillins Hives and Other (See Comments)    Has patient had a PCN reaction causing immediate rash, facial/tongue/throat swelling, SOB or lightheadedness with hypotension:  Has patient had a PCN reaction causing severe rash involving mucus membranes or skin necrosis:  Has patient had a PCN reaction that required hospitalization:  Has patient had a PCN reaction occurring within the last 10 years:  If all of the above answers are NO, then may proceed with Cephalosporin use.    Semaglutide  Diarrhea and Nausea And Vomiting    Current Outpatient Medications on File Prior to Visit  Medication Sig Dispense Refill   buPROPion  (WELLBUTRIN  XL) 150 MG 24 hr tablet Take 2 tablets (300 mg) by mouth daily. 180 tablet 1   fluticasone  (FLONASE ) 50 MCG/ACT nasal spray Place 2 sprays into both nostrils daily. 16 g 6   ibuprofen  (ADVIL ) 200 MG tablet Take 1 tablet (200 mg total) by mouth every 6 (six) hours as needed. 30 tablet 2   montelukast  (SINGULAIR ) 10 MG tablet Take 1 tablet (10 mg total) by mouth at bedtime. 90 tablet 1   tadalafil  (CIALIS ) 20 MG tablet Take 0.5-1 tablets (10-20 mg total) by mouth daily as needed for erectile dysfunction. Take one hour prior to need as directed. 6 tablet 1   No current facility-administered medications on file prior to visit.    BP (!) 144/92   Pulse 63   Temp 98.1 F (36.7 C) (Oral)   Ht 6' 2.33 (1.888 m)   Wt (!) 373 lb 9.6 oz (169.5 kg)   SpO2 97%   BMI 47.54 kg/m  Objective:   Physical Exam  Cardiovascular:     Rate and Rhythm: Normal rate and regular rhythm.  Pulmonary:     Effort: Pulmonary  effort is normal.     Breath sounds: Normal breath sounds.  Abdominal:     Palpations: Abdomen is soft.     Tenderness: There is no abdominal tenderness.   Musculoskeletal:     Cervical back: Neck supple.   Skin:    General: Skin is warm and dry.   Neurological:     Mental Status: He is alert and oriented to person, place, and time.   Psychiatric:        Mood and Affect: Mood normal.           Assessment & Plan:  Essential hypertension Assessment & Plan: Above goal, also with prior documented readings.  Increase hydrochlorothiazide  to 25 mg. Combine losartan  100 mg and hydrochlorothiazide  25 mg into one pill. Rx sent to pharmacy.  We will plan to see him back in 2 to 3 weeks for blood pressure check and BMP.  Orders: -     Losartan  Potassium-HCTZ; Take 1 tablet by mouth daily. for blood pressure.  Dispense: 90 tablet; Refill: 0 -     Comprehensive metabolic panel with GFR  Environmental and seasonal allergies Assessment & Plan: Clearly uncontrolled for years.  Stopped montelukast , Zyrtec /Allegra .  Start carbinoxamine 4 mg twice daily.  Slowly add back Zyrtec  or Allegra  if needed. He will update.  Orders: -     Carbinoxamine Maleate; Take 1 tablet (4 mg total) by mouth 2 (two) times daily. For allergies  Dispense: 180 tablet; Refill: 0  Multiple nevi Assessment & Plan: Referral placed to dermatology per patient request.  Orders: -     Ambulatory referral to Dermatology  B12 deficiency Assessment & Plan: Repeat vitamin B12 level pending. Continue vitamin B12 supplement.  Orders: -     Vitamin B12  Recurrent major depressive disorder, in full remission [facility]) Assessment & Plan: Controlled.  Continue alternating bupropion  XL 150 mg and 300 mg every other day.    Hypertriglyceridemia Assessment & Plan: Reviewed labs from September 2024. Repeat lipid panel pending today.  Orders: -  Lipid panel  Numbness and tingling of both feet Assessment  & Plan: Chronic, waxes and wanes.  Reviewed EMG testing from July 2022. Checking labs today including B12, CBC.  We discussed the option of a trial of low-dose gabapentin at bedtime versus podiatry referral.  He opts for podiatry referral. Referral placed to podiatry.  Orders: -     Ambulatory referral to Podiatry -     Vitamin B12  Prediabetes Assessment & Plan: Repeat A1c pending.  Orders: -     Hemoglobin A1c        Gabriel John, NP

## 2024-03-09 NOTE — Assessment & Plan Note (Signed)
 Chronic, waxes and wanes.  Reviewed EMG testing from July 2022. Checking labs today including B12, CBC.  We discussed the option of a trial of low-dose gabapentin at bedtime versus podiatry referral.  He opts for podiatry referral. Referral placed to podiatry.

## 2024-03-09 NOTE — Assessment & Plan Note (Signed)
 Repeat vitamin B12 level pending. Continue vitamin B12 supplement.

## 2024-03-09 NOTE — Patient Instructions (Addendum)
 We increased the dose of your blood pressure medication hydrochlorothiazide  25 mg daily.  I combined your blood pressure pills together and to 1 pill: Losartan -chlorothiazide 100-25 mg daily.  I sent a new prescription to your pharmacy.  You will either be contacted via phone regarding your referral to both podiatry and dermatology, or you may receive a letter on your MyChart portal from our referral team with instructions for scheduling an appointment. Please let us  know if you have not been contacted by anyone within two weeks.  Stop taking Zyrtec , Allegra , Singulair .  Start carbinoxamine 4 mg tablets twice daily for allergies.  Please keep me updated.  Stop by the lab prior to leaving today. I will notify you of your results once received.   Please schedule a follow up visit to meet back with me in 2-3 weeks for blood pressure check.   It was a pleasure to see you today!

## 2024-03-09 NOTE — Assessment & Plan Note (Signed)
 Controlled.  Continue alternating bupropion  XL 150 mg and 300 mg every other day.

## 2024-03-09 NOTE — Assessment & Plan Note (Signed)
 Above goal, also with prior documented readings.  Increase hydrochlorothiazide  to 25 mg. Combine losartan  100 mg and hydrochlorothiazide  25 mg into one pill. Rx sent to pharmacy.  We will plan to see him back in 2 to 3 weeks for blood pressure check and BMP.

## 2024-03-09 NOTE — Assessment & Plan Note (Signed)
 Clearly uncontrolled for years.  Stopped montelukast , Zyrtec /Allegra .  Start carbinoxamine 4 mg twice daily.  Slowly add back Zyrtec  or Allegra  if needed. He will update.

## 2024-03-09 NOTE — Assessment & Plan Note (Signed)
 Reviewed labs from September 2024. Repeat lipid panel pending today.

## 2024-03-09 NOTE — Assessment & Plan Note (Signed)
Referral placed to dermatology per patient request

## 2024-03-14 ENCOUNTER — Ambulatory Visit: Admitting: Podiatry

## 2024-03-14 ENCOUNTER — Other Ambulatory Visit (HOSPITAL_COMMUNITY): Payer: Self-pay

## 2024-03-14 ENCOUNTER — Encounter: Payer: Self-pay | Admitting: Podiatry

## 2024-03-14 DIAGNOSIS — G587 Mononeuritis multiplex: Secondary | ICD-10-CM

## 2024-03-14 DIAGNOSIS — G5791 Unspecified mononeuropathy of right lower limb: Secondary | ICD-10-CM | POA: Diagnosis not present

## 2024-03-14 DIAGNOSIS — G5792 Unspecified mononeuropathy of left lower limb: Secondary | ICD-10-CM

## 2024-03-14 MED ORDER — PREGABALIN 50 MG PO CAPS: 50.0000 mg | ORAL_CAPSULE | Freq: Three times a day (TID) | ORAL | 0 refills | Status: DC

## 2024-03-14 MED ORDER — PREGABALIN 100 MG PO CAPS
100.0000 mg | ORAL_CAPSULE | Freq: Three times a day (TID) | ORAL | 3 refills | Status: DC
Start: 2024-03-14 — End: 2024-03-14

## 2024-03-14 MED ORDER — PREGABALIN 50 MG PO CAPS
50.0000 mg | ORAL_CAPSULE | Freq: Three times a day (TID) | ORAL | 0 refills | Status: DC
  Filled 2024-03-14: qty 90, 30d supply, fill #0

## 2024-03-14 MED ORDER — PREGABALIN 100 MG PO CAPS
100.0000 mg | ORAL_CAPSULE | Freq: Three times a day (TID) | ORAL | 3 refills | Status: AC
  Filled 2024-03-14: qty 90, 30d supply, fill #0
  Filled 2024-05-28: qty 90, 30d supply, fill #1
  Filled 2024-07-06: qty 90, 30d supply, fill #2

## 2024-03-14 NOTE — Addendum Note (Signed)
 Addended by: GERRIT ANDREZ CROME on: 03/14/2024 04:01 PM   Modules accepted: Orders

## 2024-03-14 NOTE — Addendum Note (Signed)
 Addended by: ELAYNE ROSINA BRAVO on: 03/14/2024 04:05 PM   Modules accepted: Orders

## 2024-03-14 NOTE — Addendum Note (Signed)
 Addended by: CHRISTINE NORLEEN LABOR on: 03/14/2024 04:07 PM   Modules accepted: Orders

## 2024-03-14 NOTE — Progress Notes (Signed)
 Presents today with complaint burning and numbness in the feet bilaterally.  Has had had it for many years.  Had an EMG and NCV in 23 and they did not really find anything.  He has a strong family history of hereditary idiopathic peripheral neuropathy.  He said almost everyone in his family has been on gabapentin at 1 point or another and they have not responded well to it in terms of side effects.   Physical exam:  General appearance: Pleasant, and in no acute distress. AOx3.  Vascular: Pedal pulses: DP 2/4 bilaterally, PT 2/4 bilaterally.  Moderate edema lower legs bilaterally. Capillary fill time bilaterally.  Neurological: Light touch intact feet bilaterally.  Normal Achilles reflex bilaterally.  No clonus or spasticity noted.  Diminished vibratory sensation in the foot bilaterally.  Absent monofilament filament sensation toes 1 through 5 left.  Negative Tinel's sign tarsal tunnel porta pedis and cutaneous nerves around ankle and foot  Dermatologic:   Skin normal temperature bilaterally.  Skin normal color, tone, and texture bilaterally.   Musculoskeletal: Hammertoes 2 through 5 bilaterally.  She will N[place] I have sent you likable shorts no yeah I missed my care for mild office had a chair that was really comfortable in my backs like    Diagnosis: 1.  Neuritis feet bilaterally. 2. 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  Members are present as family have not reacted well to gabapentin we will start him on Lyrica discussed with neuropathy discussed with risk and benefits of Lyrica.  Explained that it normally takes about 3 months to see if it is working or not.  If he gets no improvement at all with the Lyrica may consider neurology consult -Rx Lyrica 100 mg 1 p.o. 3 times daily 3 refills.  Will start on Lyrica 50 mg 1  p.o. 3 times daily for 1 or 2 weeks at first  Return 3 months follow-up neuropathy

## 2024-03-15 ENCOUNTER — Other Ambulatory Visit: Payer: Self-pay

## 2024-03-30 ENCOUNTER — Ambulatory Visit: Admitting: Primary Care

## 2024-03-30 ENCOUNTER — Encounter: Payer: Self-pay | Admitting: Primary Care

## 2024-03-30 VITALS — BP 116/70 | HR 65 | Temp 97.8°F | Ht 74.33 in | Wt 364.0 lb

## 2024-03-30 DIAGNOSIS — I10 Essential (primary) hypertension: Secondary | ICD-10-CM | POA: Diagnosis not present

## 2024-03-30 DIAGNOSIS — J3089 Other allergic rhinitis: Secondary | ICD-10-CM

## 2024-03-30 NOTE — Progress Notes (Signed)
 Subjective:    Patient ID: Joseph Graves, male    DOB: 01/19/1971, 53 y.o.   MRN: [id number]  HPI  Joseph Graves is a very pleasant 53 y.o. male with a history of hypertension, OSA, prediabetes who presents today for follow-up of hypertension.  He was last evaluated on 03/09/2024 to reestablish care.  During this visit his blood pressure was noted to be above goal during multiple prior office visits including this one despite management on losartan  100 mg and HCTZ 12.5 mg daily.  Given his elevated readings his hydrochlorothiazide  was increased to 25 mg and combined with losartan  100 mg.  He is here for follow-up today.  Since his last visit he is compliant to losartan -hydrochlorothiazide  100-25 mg daily. He has not checked BP outside of our clinic. He has no symptoms. He is feeling better overall.   He has noticed some improvement with allergy symptoms since initiation of carbinoxamine  4 mg BID. Also following with podiatry for neuritis, has noticed improvement in Lyrica  and is building up slowly to 100 mg daily.  BP Readings from Last 3 Encounters:  03/30/24 116/70  03/09/24 (!) 144/92  01/27/24 (!) 153/106      Review of Systems  Respiratory:  Negative for shortness of breath.   Cardiovascular:  Negative for chest pain.  Neurological:  Negative for dizziness and headaches.         Past Medical History:  Diagnosis Date   Allergy    Anxiety    Asthma    outgrew at 68-57 years of age.    Depression    Edema of both feet    GERD (gastroesophageal reflux disease)    Hypertension    Lactose intolerance    Numbness in feet    Sleep apnea     Social History   Socioeconomic History   Marital status: Married    Spouse name: Jenkins   Number of children: Not on file   Years of education: Not on file   Highest education level: Bachelor's degree (e.g., BA, AB, BS)  Occupational History   Occupation: Museum/gallery exhibitions officer  Tobacco Use   Smoking status: Former     Current packs/day: 0.00    Average packs/day: 0.3 packs/day for 15.1 years (3.8 ttl pk-yrs)    Types: Cigarettes    Start date: 03/1986    Quit date: 1992    Years since quitting: 33.5    Passive exposure: Never   Smokeless tobacco: Never  Vaping Use   Vaping status: Never Used  Substance and Sexual Activity   Alcohol use: Yes    Alcohol/week: 1.0 standard drink of alcohol    Types: 1 Cans of beer per week   Drug use: No   Sexual activity: Yes    Birth control/protection: Surgical  Other Topics Concern   Not on file  Social History Narrative   Not on file   Social Drivers of Health   Financial Resource Strain: Low Risk  (03/09/2024)   Overall Financial Resource Strain (CARDIA)    Difficulty of Paying Living Expenses: Not very hard  Food Insecurity: No Food Insecurity (03/09/2024)   Hunger Vital Sign    Worried About Running Out of Food in the Last Year: Never true    Ran Out of Food in the Last Year: Never true  Transportation Needs: No Transportation Needs (03/09/2024)   PRAPARE - Administrator, Civil Service (Medical): No    Lack of Transportation (Non-Medical): No  Physical Activity: Insufficiently Active (03/09/2024)   Exercise Vital Sign    Days of Exercise per Week: 2 days    Minutes of Exercise per Session: 10 min  Stress: Stress Concern Present (03/09/2024)   [employer] of Occupational Health - Occupational Stress Questionnaire    Feeling of Stress: To some extent  Social Connections: Socially Integrated (03/09/2024)   Social Connection and Isolation Panel    Frequency of Communication with Friends and Family: Twice a week    Frequency of Social Gatherings with Friends and Family: Once a week    Attends Religious Services: More than 4 times per year    Active Member of [employer] or Organizations: Yes    Attends Engineer, structural: More than 4 times per year    Marital Status: Married  Catering manager Violence: Not on file    Past  Surgical History:  Procedure Laterality Date   BICEPS TENDON REPAIR  11/2013   NASAL SINUS SURGERY     VASECTOMY      Family History  Problem Relation Age of Onset   Stroke Mother    Allergic rhinitis Mother    Sleep apnea Mother    Obesity Mother    Bipolar disorder Mother    Depression Father        suicide   Cancer Father    Diabetes Father    Allergic rhinitis Father    Anxiety disorder Father    Diabetes Paternal Uncle    Alcohol abuse Maternal Grandfather    Hypertension Maternal Grandfather    Alcohol abuse Paternal Grandfather     Allergies  Allergen Reactions   Amoxicillin-Pot Clavulanate Rash   Penicillins Hives and Other (See Comments)    Has patient had a PCN reaction causing immediate rash, facial/tongue/throat swelling, SOB or lightheadedness with hypotension:  Has patient had a PCN reaction causing severe rash involving mucus membranes or skin necrosis:  Has patient had a PCN reaction that required hospitalization:  Has patient had a PCN reaction occurring within the last 10 years:  If all of the above answers are NO, then may proceed with Cephalosporin use.    Semaglutide  Diarrhea and Nausea And Vomiting    Current Outpatient Medications on File Prior to Visit  Medication Sig Dispense Refill   buPROPion  (WELLBUTRIN  XL) 150 MG 24 hr tablet Take 2 tablets (300 mg) by mouth daily. 180 tablet 1   Carbinoxamine  Maleate 4 MG TABS Take 1 tablet (4 mg total) by mouth 2 (two) times daily. For allergies 180 tablet 0   fluticasone  (FLONASE ) 50 MCG/ACT nasal spray Place 2 sprays into both nostrils daily. 16 g 6   ibuprofen  (ADVIL ) 200 MG tablet Take 1 tablet (200 mg total) by mouth every 6 (six) hours as needed. 30 tablet 2   losartan -hydrochlorothiazide  (HYZAAR ) 100-25 MG tablet Take 1 tablet by mouth daily. for blood pressure. 90 tablet 0   montelukast  (SINGULAIR ) 10 MG tablet Take 1 tablet (10 mg total) by mouth at bedtime. 90 tablet 1   pregabalin  (LYRICA ) 100  MG capsule Take 1 capsule (100 mg total) by mouth 3 (three) times daily. 90 capsule 3   pregabalin  (LYRICA ) 50 MG capsule Take 1 capsule (50 mg total) by mouth 3 (three) times daily. 90 capsule 0   tadalafil  (CIALIS ) 20 MG tablet Take 0.5-1 tablets (10-20 mg total) by mouth daily as needed for erectile dysfunction. Take one hour prior to need as directed. 6 tablet 1   No current facility-administered  medications on file prior to visit.    BP 116/70   Pulse 65   Temp 97.8 F (36.6 C) (Temporal)   Ht 6' 2.33 (1.888 m)   Wt (!) 364 lb (165.1 kg)   SpO2 98%   BMI 46.32 kg/m  Objective:   Physical Exam Cardiovascular:     Rate and Rhythm: Normal rate and regular rhythm.  Pulmonary:     Effort: Pulmonary effort is normal.     Breath sounds: Normal breath sounds.  Musculoskeletal:     Cervical back: Neck supple.  Skin:    General: Skin is warm and dry.  Neurological:     Mental Status: He is alert and oriented to person, place, and time.  Psychiatric:        Mood and Affect: Mood normal.           Assessment & Plan:  Essential hypertension Assessment & Plan: Improved and controlled!  Continue losartan -hydrochlorothiazide  100-25 mg daily. Follow up in September for CPE   Environmental and seasonal allergies Assessment & Plan: Improving!  Continue carbinoxamine  4 mg BID, Singulair  10 mg HS, and Flonsae.         Kasheem Toner K Patsie Mccardle, NP

## 2024-03-30 NOTE — Assessment & Plan Note (Addendum)
 Improving!  Continue carbinoxamine  4 mg BID, Singulair  10 mg HS, and Flonsae.

## 2024-03-30 NOTE — Assessment & Plan Note (Signed)
 Improved and controlled!  Continue losartan -hydrochlorothiazide  100-25 mg daily. Follow up in September for CPE

## 2024-04-17 ENCOUNTER — Other Ambulatory Visit: Payer: Self-pay | Admitting: Family Medicine

## 2024-04-17 DIAGNOSIS — F3289 Other specified depressive episodes: Secondary | ICD-10-CM

## 2024-04-18 ENCOUNTER — Other Ambulatory Visit (HOSPITAL_COMMUNITY): Payer: Self-pay

## 2024-04-18 ENCOUNTER — Other Ambulatory Visit: Payer: Self-pay | Admitting: Primary Care

## 2024-04-18 DIAGNOSIS — F3289 Other specified depressive episodes: Secondary | ICD-10-CM

## 2024-04-18 MED ORDER — BUPROPION HCL ER (XL) 150 MG PO TB24
300.0000 mg | ORAL_TABLET | Freq: Every day | ORAL | 2 refills | Status: AC
  Filled 2024-04-18: qty 180, 90d supply, fill #0
  Filled 2024-08-11: qty 180, 90d supply, fill #1

## 2024-04-19 NOTE — Progress Notes (Signed)
 This encounter was created in error - please disregard.

## 2024-04-28 ENCOUNTER — Other Ambulatory Visit (HOSPITAL_COMMUNITY): Payer: Self-pay

## 2024-05-28 ENCOUNTER — Other Ambulatory Visit (HOSPITAL_COMMUNITY): Payer: Self-pay

## 2024-06-01 ENCOUNTER — Encounter: Admitting: Primary Care

## 2024-06-08 ENCOUNTER — Other Ambulatory Visit (HOSPITAL_COMMUNITY): Payer: Self-pay

## 2024-06-08 ENCOUNTER — Other Ambulatory Visit: Payer: Self-pay | Admitting: Primary Care

## 2024-06-08 DIAGNOSIS — I10 Essential (primary) hypertension: Secondary | ICD-10-CM

## 2024-06-09 ENCOUNTER — Encounter: Payer: Self-pay | Admitting: Primary Care

## 2024-06-09 ENCOUNTER — Other Ambulatory Visit (HOSPITAL_COMMUNITY): Payer: Self-pay

## 2024-06-09 ENCOUNTER — Ambulatory Visit (INDEPENDENT_AMBULATORY_CARE_PROVIDER_SITE_OTHER): Admitting: Primary Care

## 2024-06-09 ENCOUNTER — Ambulatory Visit: Payer: Self-pay | Admitting: Primary Care

## 2024-06-09 VITALS — BP 124/80 | HR 68 | Temp 97.5°F | Ht 74.0 in | Wt 365.0 lb

## 2024-06-09 DIAGNOSIS — Z125 Encounter for screening for malignant neoplasm of prostate: Secondary | ICD-10-CM | POA: Diagnosis not present

## 2024-06-09 DIAGNOSIS — R6882 Decreased libido: Secondary | ICD-10-CM | POA: Diagnosis not present

## 2024-06-09 DIAGNOSIS — F3342 Major depressive disorder, recurrent, in full remission: Secondary | ICD-10-CM | POA: Diagnosis not present

## 2024-06-09 DIAGNOSIS — I10 Essential (primary) hypertension: Secondary | ICD-10-CM | POA: Diagnosis not present

## 2024-06-09 DIAGNOSIS — R200 Anesthesia of skin: Secondary | ICD-10-CM

## 2024-06-09 DIAGNOSIS — R202 Paresthesia of skin: Secondary | ICD-10-CM

## 2024-06-09 DIAGNOSIS — R7303 Prediabetes: Secondary | ICD-10-CM

## 2024-06-09 DIAGNOSIS — J3089 Other allergic rhinitis: Secondary | ICD-10-CM

## 2024-06-09 DIAGNOSIS — R41840 Attention and concentration deficit: Secondary | ICD-10-CM | POA: Diagnosis not present

## 2024-06-09 DIAGNOSIS — E781 Pure hyperglyceridemia: Secondary | ICD-10-CM | POA: Diagnosis not present

## 2024-06-09 DIAGNOSIS — G4733 Obstructive sleep apnea (adult) (pediatric): Secondary | ICD-10-CM | POA: Diagnosis not present

## 2024-06-09 DIAGNOSIS — Z23 Encounter for immunization: Secondary | ICD-10-CM

## 2024-06-09 DIAGNOSIS — Z0001 Encounter for general adult medical examination with abnormal findings: Secondary | ICD-10-CM | POA: Diagnosis not present

## 2024-06-09 DIAGNOSIS — Z1211 Encounter for screening for malignant neoplasm of colon: Secondary | ICD-10-CM

## 2024-06-09 LAB — BASIC METABOLIC PANEL WITH GFR
BUN: 20 mg/dL (ref 6–23)
CO2: 25 meq/L (ref 19–32)
Calcium: 9.7 mg/dL (ref 8.4–10.5)
Chloride: 105 meq/L (ref 96–112)
Creatinine, Ser: 0.93 mg/dL (ref 0.40–1.50)
GFR: 92.97 mL/min (ref >60.00)
Glucose, Bld: 108 mg/dL — ABNORMAL HIGH (ref 70–99)
Potassium: 4 meq/L (ref 3.5–5.1)
Sodium: 140 meq/L (ref 135–145)

## 2024-06-09 LAB — PSA: PSA: 0.18 ng/mL (ref 0.10–4.00)

## 2024-06-09 LAB — HEMOGLOBIN A1C: Hgb A1c MFr Bld: 6.3 % (ref 4.6–6.5)

## 2024-06-09 MED ORDER — LOSARTAN POTASSIUM-HCTZ 100-25 MG PO TABS
1.0000 | ORAL_TABLET | Freq: Every day | ORAL | 3 refills | Status: AC
  Filled 2024-06-09: qty 90, 90d supply, fill #0
  Filled 2024-09-07: qty 90, 90d supply, fill #1

## 2024-06-09 NOTE — Assessment & Plan Note (Signed)
Continue CPAP nightly. °

## 2024-06-09 NOTE — Assessment & Plan Note (Signed)
 Overall stable.  Reviewed lipid panel from June 2025.

## 2024-06-09 NOTE — Assessment & Plan Note (Signed)
 Controlled.  Continue lostartan-hydrochlorothiazide  100-25 mg daily. CMP reviewed from June 2025.

## 2024-06-09 NOTE — Assessment & Plan Note (Signed)
 Discussed Shingrix  vaccines, he will think about this.  Colon cancer screening due, he opts for Cologuard again, orders placed PSA due and pending.  Discussed the importance of a healthy diet and regular exercise in order for weight loss, and to reduce the risk of further co-morbidity.  Exam stable. Labs pending.  Follow up in 1 year for repeat physical.

## 2024-06-09 NOTE — Assessment & Plan Note (Signed)
 Waxes and wanes.  Continue Lyrica  100 mg daily. Reviewed B12 labs from June 2025.  We discussed B12 supplements.

## 2024-06-09 NOTE — Assessment & Plan Note (Signed)
 Commended him on non pharmacologic ways to improve focus.  Referral placed for ADD/ADHD testing.

## 2024-06-09 NOTE — Progress Notes (Signed)
 Subjective:    Patient ID: Joseph Graves, male    DOB: December 29, 1970, 53 y.o.   MRN: [id number]  Joseph Graves is a very pleasant 53 y.o. male who presents today for complete physical and follow up of chronic conditions.  He would also like to discuss difficulty concentrating. Chronic since childhood, no formal diagnosis of ADD or ADHD, never tested. Over the last 4 months symptoms are worse as he's not as busy as before due to some changes at work. He finds that he must be busy or he will have difficulty focusing and has wandering thoughts. He's also forgetting things his co-workers say. Also feels like my brain is full, has to write everything on a posted note to get organized. He counted this recently, but he counted 126 songs in his head. Last week he had a breakdown as he forgot a meeting. Met with employer therapist which went well.  He continues to struggle with low libido, has no desire to be intimate with his wife.  He questions if his testosterone  is low as it has always been borderline.  Immunizations: -Tetanus: Completed in 2016 -Influenza: He will get at work -Shingles: Never completed  Diet: Fair diet.  Exercise: No regular exercise.  Eye exam: Completes annually  Dental exam: Completes semi-annually    Colonoscopy: Completed Cologuard in March 2022  PSA: Due  BP Readings from Last 3 Encounters:  06/09/24 124/80  03/30/24 116/70  03/09/24 (!) 144/92    Wt Readings from Last 3 Encounters:  06/09/24 (!) 365 lb (165.6 kg)  03/30/24 (!) 364 lb (165.1 kg)  03/09/24 (!) 373 lb 9.6 oz (169.5 kg)        Review of Systems  Constitutional:  Negative for unexpected weight change.  HENT:  Negative for rhinorrhea.   Respiratory:  Negative for cough and shortness of breath.   Cardiovascular:  Negative for chest pain.  Gastrointestinal:  Negative for constipation and diarrhea.  Genitourinary:  Negative for difficulty urinating.       Low libido  Musculoskeletal:   Positive for back pain.  Skin:  Negative for rash.  Allergic/Immunologic: Negative for environmental allergies.  Neurological:  Positive for numbness. Negative for dizziness and headaches.  Psychiatric/Behavioral:  Positive for decreased concentration. The patient is not nervous/anxious.          Past Medical History:  Diagnosis Date   Allergy    Anxiety    Asthma    outgrew at 82-59 years of age.    Depression    Edema of both feet    Encounter for annual general medical examination with abnormal findings in adult 06/09/2024   GERD (gastroesophageal reflux disease)    Hypertension    Lactose intolerance    Numbness in feet    Sleep apnea     Social History   Socioeconomic History   Marital status: Married    Spouse name: Joseph Graves   Number of children: Not on file   Years of education: Not on file   Highest education level: Bachelor's degree (e.g., BA, AB, BS)  Occupational History   Occupation: Museum/gallery exhibitions officer  Tobacco Use   Smoking status: Former    Current packs/day: 0.00    Average packs/day: 0.3 packs/day for 15.1 years (3.8 ttl pk-yrs)    Types: Cigarettes    Start date: 03/1986    Quit date: 1992    Years since quitting: 33.7    Passive exposure: Never   Smokeless tobacco: Never  Vaping Use   Vaping status: Never Used  Substance and Sexual Activity   Alcohol use: Yes    Alcohol/week: 1.0 standard drink of alcohol    Types: 1 Cans of beer per week   Drug use: No   Sexual activity: Yes    Birth control/protection: Surgical  Other Topics Concern   Not on file  Social History Narrative   Not on file   Social Drivers of Health   Financial Resource Strain: Low Risk  (03/09/2024)   Overall Financial Resource Strain (CARDIA)    Difficulty of Paying Living Expenses: Not very hard  Food Insecurity: No Food Insecurity (03/09/2024)   Hunger Vital Sign    Worried About Running Out of Food in the Last Year: Never true    Ran Out of Food in the Last  Year: Never true  Transportation Needs: No Transportation Needs (03/09/2024)   PRAPARE - Administrator, Civil Service (Medical): No    Lack of Transportation (Non-Medical): No  Physical Activity: Insufficiently Active (03/09/2024)   Exercise Vital Sign    Days of Exercise per Week: 2 days    Minutes of Exercise per Session: 10 min  Stress: Stress Concern Present (03/09/2024)   [employer] of Occupational Health - Occupational Stress Questionnaire    Feeling of Stress: To some extent  Social Connections: Socially Integrated (03/09/2024)   Social Connection and Isolation Panel    Frequency of Communication with Friends and Family: Twice a week    Frequency of Social Gatherings with Friends and Family: Once a week    Attends Religious Services: More than 4 times per year    Active Member of [employer] or Organizations: Yes    Attends Engineer, structural: More than 4 times per year    Marital Status: Married  Catering manager Violence: Not on file    Past Surgical History:  Procedure Laterality Date   BICEPS TENDON REPAIR  11/2013   NASAL SINUS SURGERY     VASECTOMY      Family History  Problem Relation Age of Onset   Stroke Mother    Allergic rhinitis Mother    Sleep apnea Mother    Obesity Mother    Bipolar disorder Mother    Depression Father        suicide   Cancer Father    Diabetes Father    Allergic rhinitis Father    Anxiety disorder Father    Diabetes Paternal Uncle    Alcohol abuse Maternal Grandfather    Hypertension Maternal Grandfather    Alcohol abuse Paternal Grandfather     Allergies  Allergen Reactions   Amoxicillin-Pot Clavulanate Rash   Penicillins Hives and Other (See Comments)    Has patient had a PCN reaction causing immediate rash, facial/tongue/throat swelling, SOB or lightheadedness with hypotension:  Has patient had a PCN reaction causing severe rash involving mucus membranes or skin necrosis:  Has patient had a PCN  reaction that required hospitalization:  Has patient had a PCN reaction occurring within the last 10 years:  If all of the above answers are NO, then may proceed with Cephalosporin use.    Semaglutide  Diarrhea and Nausea And Vomiting    Current Outpatient Medications on File Prior to Visit  Medication Sig Dispense Refill   buPROPion  (WELLBUTRIN  XL) 150 MG 24 hr tablet Take 2 tablets (300 mg total) by mouth daily for depression 180 tablet 2   Carbinoxamine  Maleate 4 MG TABS Take 1 tablet (  4 mg total) by mouth 2 (two) times daily. For allergies 180 tablet 0   fluticasone  (FLONASE ) 50 MCG/ACT nasal spray Place 2 sprays into both nostrils daily. 16 g 6   ibuprofen  (ADVIL ) 200 MG tablet Take 1 tablet (200 mg total) by mouth every 6 (six) hours as needed. 30 tablet 2   montelukast  (SINGULAIR ) 10 MG tablet Take 1 tablet (10 mg total) by mouth at bedtime. 90 tablet 1   pregabalin  (LYRICA ) 100 MG capsule Take 1 capsule (100 mg total) by mouth 3 (three) times daily. 90 capsule 3   tadalafil  (CIALIS ) 20 MG tablet Take 0.5-1 tablets (10-20 mg total) by mouth daily as needed for erectile dysfunction. Take one hour prior to need as directed. 6 tablet 1   pregabalin  (LYRICA ) 50 MG capsule Take 1 capsule (50 mg total) by mouth 3 (three) times daily. (Patient not taking: Reported on 06/09/2024) 90 capsule 0   No current facility-administered medications on file prior to visit.    BP 124/80   Pulse 68   Temp (!) 97.5 F (36.4 C) (Temporal)   Ht 6' 2 (1.88 m)   Wt (!) 365 lb (165.6 kg)   SpO2 99%   BMI 46.86 kg/m  Objective:   Physical Exam HENT:     Right Ear: Tympanic membrane and ear canal normal.     Left Ear: Tympanic membrane and ear canal normal.  Eyes:     Pupils: Pupils are equal, round, and reactive to light.  Cardiovascular:     Rate and Rhythm: Normal rate and regular rhythm.  Pulmonary:     Effort: Pulmonary effort is normal.     Breath sounds: Normal breath sounds.  Abdominal:      General: Bowel sounds are normal.     Palpations: Abdomen is soft.     Tenderness: There is no abdominal tenderness.  Musculoskeletal:        General: Normal range of motion.     Cervical back: Neck supple.  Skin:    General: Skin is warm and dry.  Neurological:     Mental Status: He is alert and oriented to person, place, and time.     Cranial Nerves: No cranial nerve deficit.     Deep Tendon Reflexes:     Reflex Scores:      Patellar reflexes are 2+ on the right side and 2+ on the left side. Psychiatric:        Mood and Affect: Mood normal.     Physical Exam        Assessment & Plan:  Encounter for annual general medical examination with abnormal findings in adult Assessment & Plan: Discussed Shingrix  vaccines, he will think about this.  Colon cancer screening due, he opts for Cologuard again, orders placed PSA due and pending.  Discussed the importance of a healthy diet and regular exercise in order for weight loss, and to reduce the risk of further co-morbidity.  Exam stable. Labs pending.  Follow up in 1 year for repeat physical.    Essential hypertension Assessment & Plan: Controlled.  Continue lostartan-hydrochlorothiazide  100-25 mg daily. CMP reviewed from June 2025.  Orders: -     Losartan  Potassium-HCTZ; Take 1 tablet by mouth daily. for blood pressure.  Dispense: 90 tablet; Refill: 3 -     Basic metabolic panel with GFR  OSA on CPAP Assessment & Plan: Continue CPAP nightly    Prediabetes Assessment & Plan: Repeat A1C pending.   Orders: -  Hemoglobin A1c  Numbness and tingling of both feet Assessment & Plan: Waxes and wanes.  Continue Lyrica  100 mg daily. Reviewed B12 labs from June 2025.  We discussed B12 supplements.    Hypertriglyceridemia Assessment & Plan: Overall stable.  Reviewed lipid panel from June 2025.   Environmental and seasonal allergies Assessment & Plan: Improved.  Continue carbinoxamine  4 mg BID.    Continue Singulair  10 mg daily.   Recurrent major depressive disorder, in full remission [facility]) Assessment & Plan: Controlled.  Continue bupropion  XL 300 mg daily.     Difficulty concentrating Assessment & Plan: Commended him on non pharmacologic ways to improve focus.  Referral placed for ADD/ADHD testing.    Orders: -     Ambulatory referral to Psychology  Screening for colon cancer -     Cologuard  Screening for prostate cancer -     PSA  Low libido Assessment & Plan: Testosterone  levels pending.  Orders: -     Testosterone ,Free and Total    Assessment and Plan Assessment & Plan         Comer MARLA Gaskins, NP    History of Present Illness

## 2024-06-09 NOTE — Assessment & Plan Note (Signed)
 Testosterone levels pending

## 2024-06-09 NOTE — Assessment & Plan Note (Signed)
 Repeat A1C pending.

## 2024-06-09 NOTE — Assessment & Plan Note (Signed)
Controlled.  Continue bupropion XL 300 mg daily.

## 2024-06-09 NOTE — Patient Instructions (Signed)
 You will either be contacted via phone regarding your referral for ADHD testing, or you may receive a letter on your MyChart portal from our referral team with instructions for scheduling an appointment. Please let us  know if you have not been contacted by anyone within two weeks.  Stop by the lab prior to leaving today. I will notify you of your results once received.   Complete the Cologuard kit once received.  It was a pleasure to see you today!

## 2024-06-09 NOTE — Assessment & Plan Note (Signed)
 Improved.  Continue carbinoxamine  4 mg BID.   Continue Singulair  10 mg daily.

## 2024-06-11 ENCOUNTER — Other Ambulatory Visit (HOSPITAL_COMMUNITY): Payer: Self-pay

## 2024-06-11 MED ORDER — FLUZONE 0.5 ML IM SUSY
0.5000 mL | PREFILLED_SYRINGE | Freq: Once | INTRAMUSCULAR | 0 refills | Status: AC
  Filled 2024-06-11: qty 0.5, 1d supply, fill #0

## 2024-06-13 ENCOUNTER — Encounter: Payer: Self-pay | Admitting: Podiatry

## 2024-06-13 ENCOUNTER — Other Ambulatory Visit (HOSPITAL_COMMUNITY): Payer: Self-pay

## 2024-06-13 ENCOUNTER — Ambulatory Visit: Admitting: Podiatry

## 2024-06-13 DIAGNOSIS — G609 Hereditary and idiopathic neuropathy, unspecified: Secondary | ICD-10-CM

## 2024-06-13 DIAGNOSIS — G5790 Unspecified mononeuropathy of unspecified lower limb: Secondary | ICD-10-CM

## 2024-06-13 MED ORDER — PREGABALIN 100 MG PO CAPS
100.0000 mg | ORAL_CAPSULE | Freq: Three times a day (TID) | ORAL | 1 refills | Status: AC
  Filled 2024-06-13 – 2024-08-19 (×2): qty 270, 90d supply, fill #0

## 2024-06-13 NOTE — Progress Notes (Signed)
 Patient presents today follow-up neuropathy.  Doing about 30 to 40% better so a lot of the most extreme sharp pain is better.  Tolerating Lyrica  well with no  side effects.   Physical exam:  General appearance: Pleasant, and in no acute distress. AOx3.  Vascular: Pedal pulses: DP 2/4 bilaterally, PT 2/4 bilaterally.  Moderate edema lower legs bilaterally. Capillary fill time immediate bilaterally.  Neurological: Creased vibratory sensation bilaterally.  Slightly decreased Achilles reflex bilaterally.  No clonus or spasticity noted.   Dermatologic:   Skin normal temperature bilaterally.  Skin normal color, tone, and texture bilaterally.   Musculoskeletal: Normal muscle strength lower extremity bilaterally    Diagnosis: 1.  Neuritis feet bilaterally. 2.  Peripheral neuropathy bilaterally idiopathic.  Plan: -Established office visit for evaluation management.  Discussed with him the neuropathy and continuing the Lyrica .  Has had about 30 to 40% improvement with the Lyrica  so we will continue this.  He says the worst of the pain has been reduced for the most part -Rx Lyrica  100 mg, 1 p.o. 3 times daily, 90-day prescription, 1 refill  Return 6 months follow-up neuropathy

## 2024-06-14 ENCOUNTER — Other Ambulatory Visit: Payer: Self-pay

## 2024-06-14 ENCOUNTER — Other Ambulatory Visit (HOSPITAL_COMMUNITY): Payer: Self-pay

## 2024-06-15 ENCOUNTER — Other Ambulatory Visit: Payer: Self-pay | Admitting: Primary Care

## 2024-06-15 ENCOUNTER — Other Ambulatory Visit (HOSPITAL_COMMUNITY): Payer: Self-pay

## 2024-06-15 DIAGNOSIS — J3089 Other allergic rhinitis: Secondary | ICD-10-CM

## 2024-06-15 MED ORDER — CARBINOXAMINE MALEATE 4 MG PO TABS
4.0000 mg | ORAL_TABLET | Freq: Two times a day (BID) | ORAL | 3 refills | Status: AC
  Filled 2024-06-15: qty 180, 90d supply, fill #0
  Filled 2024-09-21: qty 180, 90d supply, fill #1

## 2024-06-16 ENCOUNTER — Other Ambulatory Visit (HOSPITAL_COMMUNITY): Payer: Self-pay

## 2024-06-16 LAB — TESTOSTERONE,FREE AND TOTAL
Testosterone, Free: 6.1 pg/mL — AB (ref 7.2–24.0)
Testosterone: 423 ng/dL (ref 264–916)

## 2024-06-17 ENCOUNTER — Other Ambulatory Visit (HOSPITAL_COMMUNITY): Payer: Self-pay

## 2024-06-22 DIAGNOSIS — Z1211 Encounter for screening for malignant neoplasm of colon: Secondary | ICD-10-CM | POA: Diagnosis not present

## 2024-06-27 LAB — COLOGUARD: COLOGUARD: NEGATIVE

## 2024-07-06 ENCOUNTER — Other Ambulatory Visit: Payer: Self-pay

## 2024-07-26 ENCOUNTER — Other Ambulatory Visit (HOSPITAL_COMMUNITY): Payer: Self-pay

## 2024-07-26 MED ORDER — AZITHROMYCIN 250 MG PO TABS
ORAL_TABLET | ORAL | 0 refills | Status: AC
Start: 2024-07-26 — End: ?
  Filled 2024-07-26: qty 6, 5d supply, fill #0

## 2024-07-27 ENCOUNTER — Encounter: Payer: Self-pay | Admitting: Physician Assistant

## 2024-07-27 ENCOUNTER — Ambulatory Visit: Admitting: Physician Assistant

## 2024-07-27 VITALS — BP 129/85

## 2024-07-27 DIAGNOSIS — D229 Melanocytic nevi, unspecified: Secondary | ICD-10-CM

## 2024-07-27 DIAGNOSIS — L821 Other seborrheic keratosis: Secondary | ICD-10-CM | POA: Diagnosis not present

## 2024-07-27 NOTE — Progress Notes (Unsigned)
   New Patient Visit   Subjective  Joseph Graves is a 53 y.o. male who presents for the following: Moles - right cheek, left cheek, left popliteal, left groin. None are symptomatic, they just worry his wife.    The following portions of the chart were reviewed this encounter and updated as appropriate: medications, allergies, medical history  Review of Systems:  No other skin or systemic complaints except as noted in HPI or Assessment and Plan.  Objective  Well appearing patient in no apparent distress; mood and affect are within normal limits.   A focused examination was performed of the following areas:   Relevant exam findings are noted in the Assessment and Plan.    Assessment & Plan       No follow-ups on file.  I, Roseline Hutchinson, CMA, am acting as scribe for Quinne Pires K, PA-C .   Documentation: I have reviewed the above documentation for accuracy and completeness, and I agree with the above.  Gibbs Naugle K, PA-C

## 2024-08-10 ENCOUNTER — Ambulatory Visit

## 2024-08-10 DIAGNOSIS — Z23 Encounter for immunization: Secondary | ICD-10-CM | POA: Diagnosis not present

## 2024-08-10 NOTE — Progress Notes (Signed)
 Per orders of Mallie Gaskins, DPN AGNP-C, injection of shingrix  given by Laray Arenas in left deltoid. Patient tolerated injection well. This was second shingrix .

## 2024-08-19 ENCOUNTER — Other Ambulatory Visit (HOSPITAL_COMMUNITY): Payer: Self-pay

## 2024-08-19 ENCOUNTER — Other Ambulatory Visit: Payer: Self-pay

## 2024-09-07 ENCOUNTER — Other Ambulatory Visit: Payer: Self-pay

## 2024-12-12 ENCOUNTER — Ambulatory Visit: Admitting: Podiatry
